# Patient Record
Sex: Female | Born: 1970
Health system: Southern US, Community
[De-identification: ages and names within clinical notes are randomized; demographics above are authoritative.]

## PROBLEM LIST (undated history)

## (undated) DIAGNOSIS — Z8639 Personal history of other endocrine, nutritional and metabolic disease: Secondary | ICD-10-CM

## (undated) DIAGNOSIS — B009 Herpesviral infection, unspecified: Secondary | ICD-10-CM

## (undated) DIAGNOSIS — Z8619 Personal history of other infectious and parasitic diseases: Secondary | ICD-10-CM

## (undated) DIAGNOSIS — B9689 Other specified bacterial agents as the cause of diseases classified elsewhere: Secondary | ICD-10-CM

## (undated) DIAGNOSIS — U071 COVID-19: Secondary | ICD-10-CM

## (undated) DIAGNOSIS — N760 Acute vaginitis: Secondary | ICD-10-CM

## (undated) HISTORY — DX: Personal history of other infectious and parasitic diseases: Z86.19

## (undated) HISTORY — DX: Herpesviral infection, unspecified: B00.9

## (undated) HISTORY — DX: Personal history of other endocrine, nutritional and metabolic disease: Z86.39

## (undated) HISTORY — DX: Acute vaginitis: N76.0

## (undated) HISTORY — DX: Other specified bacterial agents as the cause of diseases classified elsewhere: B96.89

---

## 1993-01-04 DIAGNOSIS — Z8619 Personal history of other infectious and parasitic diseases: Secondary | ICD-10-CM

## 1993-01-04 DIAGNOSIS — B009 Herpesviral infection, unspecified: Secondary | ICD-10-CM

## 1993-01-04 HISTORY — DX: Herpesviral infection, unspecified: B00.9

## 1993-01-04 HISTORY — DX: Personal history of other infectious and parasitic diseases: Z86.19

## 1997-03-23 ENCOUNTER — Inpatient Hospital Stay (HOSPITAL_COMMUNITY): Admission: AD | Admit: 1997-03-23 | Discharge: 1997-03-27 | Payer: Self-pay | Admitting: Obstetrics & Gynecology

## 1997-04-03 ENCOUNTER — Encounter: Admission: RE | Admit: 1997-04-03 | Discharge: 1997-07-02 | Payer: Self-pay | Admitting: Obstetrics & Gynecology

## 1997-07-05 ENCOUNTER — Other Ambulatory Visit: Admission: RE | Admit: 1997-07-05 | Discharge: 1997-07-05 | Payer: Self-pay | Admitting: Obstetrics & Gynecology

## 1998-11-21 ENCOUNTER — Other Ambulatory Visit: Admission: RE | Admit: 1998-11-21 | Discharge: 1998-11-21 | Payer: Self-pay | Admitting: Obstetrics & Gynecology

## 1999-01-05 DIAGNOSIS — B9689 Other specified bacterial agents as the cause of diseases classified elsewhere: Secondary | ICD-10-CM

## 1999-01-05 HISTORY — DX: Other specified bacterial agents as the cause of diseases classified elsewhere: B96.89

## 1999-12-09 ENCOUNTER — Other Ambulatory Visit: Admission: RE | Admit: 1999-12-09 | Discharge: 1999-12-09 | Payer: Self-pay | Admitting: Obstetrics and Gynecology

## 2000-12-14 ENCOUNTER — Other Ambulatory Visit: Admission: RE | Admit: 2000-12-14 | Discharge: 2000-12-14 | Payer: Self-pay | Admitting: Obstetrics and Gynecology

## 2001-12-13 ENCOUNTER — Other Ambulatory Visit: Admission: RE | Admit: 2001-12-13 | Discharge: 2001-12-13 | Payer: Self-pay | Admitting: Obstetrics and Gynecology

## 2002-12-20 ENCOUNTER — Other Ambulatory Visit: Admission: RE | Admit: 2002-12-20 | Discharge: 2002-12-20 | Payer: Self-pay | Admitting: Obstetrics and Gynecology

## 2003-11-25 ENCOUNTER — Ambulatory Visit: Payer: Self-pay

## 2003-12-25 ENCOUNTER — Other Ambulatory Visit: Admission: RE | Admit: 2003-12-25 | Discharge: 2003-12-25 | Payer: Self-pay | Admitting: Obstetrics and Gynecology

## 2004-12-30 ENCOUNTER — Other Ambulatory Visit: Admission: RE | Admit: 2004-12-30 | Discharge: 2004-12-30 | Payer: Self-pay | Admitting: Obstetrics and Gynecology

## 2007-12-22 ENCOUNTER — Emergency Department (HOSPITAL_COMMUNITY): Admission: EM | Admit: 2007-12-22 | Discharge: 2007-12-22 | Payer: Self-pay | Admitting: Emergency Medicine

## 2008-03-18 DIAGNOSIS — Z8639 Personal history of other endocrine, nutritional and metabolic disease: Secondary | ICD-10-CM

## 2008-03-18 HISTORY — DX: Personal history of other endocrine, nutritional and metabolic disease: Z86.39

## 2008-04-15 ENCOUNTER — Ambulatory Visit (HOSPITAL_COMMUNITY): Admission: RE | Admit: 2008-04-15 | Discharge: 2008-04-15 | Payer: Self-pay | Admitting: Obstetrics and Gynecology

## 2009-04-18 ENCOUNTER — Ambulatory Visit (HOSPITAL_COMMUNITY): Admission: RE | Admit: 2009-04-18 | Discharge: 2009-04-18 | Payer: Self-pay | Admitting: Obstetrics and Gynecology

## 2010-04-08 ENCOUNTER — Other Ambulatory Visit (HOSPITAL_COMMUNITY): Payer: Self-pay | Admitting: Obstetrics and Gynecology

## 2010-04-08 DIAGNOSIS — Z1231 Encounter for screening mammogram for malignant neoplasm of breast: Secondary | ICD-10-CM

## 2010-04-22 ENCOUNTER — Ambulatory Visit (HOSPITAL_COMMUNITY)
Admission: RE | Admit: 2010-04-22 | Discharge: 2010-04-22 | Disposition: A | Discharge status: Home / Self Care | Payer: BC Managed Care – PPO | Source: Ambulatory Visit | Attending: Obstetrics and Gynecology | Admitting: Obstetrics and Gynecology

## 2010-04-22 DIAGNOSIS — Z1231 Encounter for screening mammogram for malignant neoplasm of breast: Secondary | ICD-10-CM | POA: Insufficient documentation

## 2011-03-26 ENCOUNTER — Other Ambulatory Visit: Payer: Self-pay | Admitting: Obstetrics and Gynecology

## 2011-03-26 DIAGNOSIS — Z1231 Encounter for screening mammogram for malignant neoplasm of breast: Secondary | ICD-10-CM

## 2011-03-31 DIAGNOSIS — B009 Herpesviral infection, unspecified: Secondary | ICD-10-CM | POA: Insufficient documentation

## 2011-04-06 ENCOUNTER — Encounter: Payer: Self-pay | Admitting: Registered Nurse

## 2011-04-20 ENCOUNTER — Ambulatory Visit: Payer: Self-pay | Admitting: Obstetrics and Gynecology

## 2011-04-22 ENCOUNTER — Ambulatory Visit: Payer: Self-pay | Admitting: Obstetrics and Gynecology

## 2011-04-26 ENCOUNTER — Ambulatory Visit (HOSPITAL_COMMUNITY)
Admission: RE | Admit: 2011-04-26 | Discharge: 2011-04-26 | Disposition: A | Discharge status: Home / Self Care | Payer: BC Managed Care – PPO | Source: Ambulatory Visit | Attending: Obstetrics and Gynecology | Admitting: Obstetrics and Gynecology

## 2011-04-26 DIAGNOSIS — Z1231 Encounter for screening mammogram for malignant neoplasm of breast: Secondary | ICD-10-CM | POA: Insufficient documentation

## 2011-04-29 ENCOUNTER — Encounter: Payer: Self-pay | Admitting: Obstetrics and Gynecology

## 2011-04-29 ENCOUNTER — Ambulatory Visit (INDEPENDENT_AMBULATORY_CARE_PROVIDER_SITE_OTHER): Payer: BC Managed Care – PPO | Admitting: Obstetrics and Gynecology

## 2011-04-29 VITALS — BP 118/78 | HR 78 | Ht 64.5 in | Wt 213.0 lb

## 2011-04-29 DIAGNOSIS — B009 Herpesviral infection, unspecified: Secondary | ICD-10-CM

## 2011-04-29 DIAGNOSIS — Z124 Encounter for screening for malignant neoplasm of cervix: Secondary | ICD-10-CM

## 2011-04-29 DIAGNOSIS — Z9189 Other specified personal risk factors, not elsewhere classified: Secondary | ICD-10-CM

## 2011-04-29 DIAGNOSIS — Z202 Contact with and (suspected) exposure to infections with a predominantly sexual mode of transmission: Secondary | ICD-10-CM

## 2011-04-29 DIAGNOSIS — Z01419 Encounter for gynecological examination (general) (routine) without abnormal findings: Secondary | ICD-10-CM

## 2011-04-29 LAB — RPR

## 2011-04-29 MED ORDER — VALACYCLOVIR HCL 500 MG PO TABS: 500.0000 mg | ORAL_TABLET | Freq: Two times a day (BID) | ORAL | Status: AC

## 2011-04-29 MED ORDER — ETONOGESTREL-ETHINYL ESTRADIOL 0.12-0.015 MG/24HR VA RING: VAGINAL_RING | VAGINAL | Status: DC

## 2011-04-29 NOTE — Progress Notes (Signed)
Last Pap: 03/18/2008 WNL: Yes Regular Periods:yes  Monthly Breast exam:yes Tetanus<52yrs:yes Nl.Bladder Function:yes Daily BMs:yes Healthy Diet:no Calcium:yes Mammogram:yes 04/26/11 Exercise:yes twice/week Seatbelt: yes Abuse at home: no Stressful work:yes Sigmoid-colonoscopy:   Subjective:    Erica [name] is a 41 y.o. female, No obstetric history on file., who presents for an annual exam.     History   Social History  . Marital Status: Married    Spouse Name: N/A    Number of Children: N/A  . Years of Education: N/A   Social History Main Topics  . Smoking status: Never Smoker   . Smokeless tobacco: None  . Alcohol Use: No  . Drug Use: No  . Sexually Active: Yes    Birth Control/ Protection: Other-see comments     Nuvaring    Other Topics Concern  . None   Social History Narrative  . None    Menstrual cycle:   LMP: Patient's last menstrual period was 04/07/2011.           Cycle: flow is light, usually lasting 3 to 4 days and Regular, monthly with normal flow and no severe dysmenorrha  The following portions of the patient's history were reviewed and updated as appropriate: allergies, current medications, past family history, past medical history, past social history, past surgical history and problem list. Wants to continue Nuvaring and Valtrex Review of Systems Pertinent items are noted in HPI. Breast:Negative for breast lump,nipple discharge or nipple retraction Gastrointestinal: Negative for abdominal pain, change in bowel habits or rectal bleeding Urinary:negative   Objective:    BP 118/78  Pulse 78  Ht 5' 4.5" (1.638 m)  Wt 213 lb (96.616 kg)  BMI 36.00 kg/m2  LMP 04/07/2011    Weight:  Wt Readings from Last 1 Encounters:  04/29/11 213 lb (96.616 kg)          BMI: Body mass index is 36.00 kg/(m^2).  General Appearance: Alert, appropriate appearance for age. No acute distress HEENT: Grossly normal Neck / Thyroid: Supple, no masses, nodes or  enlargement Lungs: clear to auscultation bilaterally Back: No CVA tenderness Breast Exam: No masses or nodes.No dimpling, nipple retraction or discharge. Cardiovascular: Regular rate and rhythm. S1, S2, no murmur Gastrointestinal: Soft, non-tender, no masses or organomegaly Pelvic Exam: Vulva and vagina appear normal. Bimanual exam reveals normal uterus and adnexa. Rectovaginal: no masses Lymphatic Exam: Non-palpable nodes in neck, clavicular, axillary, or inguinal regions Skin: no rash or abnormalities Neurologic: Normal gait and speech, no tremor  Psychiatric: Alert and oriented, appropriate affect.   Wet Prep:not applicable Urinalysis:not applicable UPT: Not done   Assessment:    Normal gyn exam  HSV  II with rare outbreaks on Valtrex suppression   Plan:    Mammogram, due 4/12 pap smear with HPV return annually or prn STD screening: done Contraception:NuvaRing vaginal inserts Continue Valtrex      Teya Otterson PMD

## 2011-04-29 NOTE — Patient Instructions (Signed)
HPV Test The HPV (Human papillomavirus) test isused to screen for high-risk types with HPV infection. HPV is a group of about 100 related viruses, of which 40 types are genital viruses. Most HPV viruses cause infections that usually resolve without treatment within 2 years. Some HPV infections can cause skin and genital warts (condylomata). HPV types 16, 18, 31 and 45 are considered high-risk types of HPV. High-risk types of HPV do not usually cause visible warts, but if untreated, may lead to cancers of the outlet of the womb (cervix) or anus. An HPV test identifies the DNA (genetic) strands of the HPV infection. Because the test identifies the DNA strands, the test is also referred to as the HPV DNA test. Although HPV is found in both males and females, the HPV test is only used to screen for cervical cancer in females. This test is recommended for females:  With an abnormal Pap test.   After treatment of an abnormal Pap test.   Aged 30 and older.   After treatment of a high-risk HPV infection.  The HPV test may be done at the same time as a Pap test in females over the age of 30. Both the HPV and Pap test require a sample of cells from the cervix. PREPARATION FOR TEST  You may be asked to avoid douching, tampons or birth canal (vaginal) medicines for 48 hours before the HPV test. You will be asked to urinate before the test. For the HPV test, you will need to lie on an exam table with your feet in stirrups. A spatula will be inserted into the vagina. The spatula will be used to swab the cervix for a cell and mucus sample. The sample will be further evaluated in a lab under a microscope. NORMAL FINDINGS  Normal: High-risk HPV is not found.  Ranges for normal findings may vary among different laboratories and hospitals. You should always check with your doctor after having lab work or other tests done to discuss the meaning of your test results and whether your values are considered within normal  limits. MEANING OF TEST An abnormal HPV test means that high-risk HPV is found. Your caregiver may recommend further testing. Your caregiver will go over the test results with you and discuss the importance and meaning of your results, as well as treatment options and the need for additional tests, if necessary. OBTAINING THE RESULTS  It is your responsibility to obtain your test results. Ask the lab or department performing the test when and how you will get your results. Document Released: 01/16/2004 Document Revised: 12/10/2010 Document Reviewed: 09/30/2004 [employer] Patient Information 2012 [employer]. 

## 2011-05-03 ENCOUNTER — Ambulatory Visit: Payer: Self-pay | Admitting: Obstetrics and Gynecology

## 2011-05-04 LAB — PAP IG, CT-NG NAA, HPV HIGH-RISK
Chlamydia Probe Amp: NEGATIVE
GC Probe Amp: NEGATIVE
HPV DNA High Risk: NOT DETECTED

## 2011-05-06 ENCOUNTER — Ambulatory Visit: Payer: Self-pay | Admitting: Obstetrics and Gynecology

## 2011-05-10 ENCOUNTER — Telehealth: Payer: Self-pay | Admitting: Obstetrics and Gynecology

## 2011-05-12 NOTE — Telephone Encounter (Signed)
Tc to pt per test results. Told pt HIV,RPR,GC/CT-all neg. Pap smear-Wnl and HPV= not detected. Pt voices understanding.

## 2011-07-27 ENCOUNTER — Encounter: Payer: Self-pay | Admitting: Obstetrics and Gynecology

## 2011-09-20 ENCOUNTER — Other Ambulatory Visit: Payer: Self-pay

## 2011-09-20 DIAGNOSIS — Z01419 Encounter for gynecological examination (general) (routine) without abnormal findings: Secondary | ICD-10-CM

## 2011-09-20 NOTE — Telephone Encounter (Signed)
nu[facility]  ld

## 2012-03-28 ENCOUNTER — Other Ambulatory Visit: Payer: Self-pay | Admitting: Obstetrics and Gynecology

## 2012-03-28 DIAGNOSIS — Z1231 Encounter for screening mammogram for malignant neoplasm of breast: Secondary | ICD-10-CM

## 2012-04-26 ENCOUNTER — Ambulatory Visit (HOSPITAL_COMMUNITY)
Admission: RE | Admit: 2012-04-26 | Discharge: 2012-04-26 | Disposition: A | Discharge status: Home / Self Care | Payer: BC Managed Care – PPO | Source: Ambulatory Visit | Attending: Obstetrics and Gynecology | Admitting: Obstetrics and Gynecology

## 2012-04-26 DIAGNOSIS — Z1231 Encounter for screening mammogram for malignant neoplasm of breast: Secondary | ICD-10-CM | POA: Insufficient documentation

## 2013-02-13 ENCOUNTER — Other Ambulatory Visit: Payer: Self-pay | Admitting: Obstetrics and Gynecology

## 2013-02-13 DIAGNOSIS — Z1231 Encounter for screening mammogram for malignant neoplasm of breast: Secondary | ICD-10-CM

## 2013-04-27 ENCOUNTER — Ambulatory Visit (HOSPITAL_COMMUNITY)
Admission: RE | Admit: 2013-04-27 | Discharge: 2013-04-27 | Disposition: A | Discharge status: Home / Self Care | Payer: 59 | Source: Ambulatory Visit | Attending: Obstetrics and Gynecology | Admitting: Obstetrics and Gynecology

## 2013-04-27 DIAGNOSIS — Z1231 Encounter for screening mammogram for malignant neoplasm of breast: Secondary | ICD-10-CM | POA: Insufficient documentation

## 2014-02-28 ENCOUNTER — Other Ambulatory Visit (HOSPITAL_COMMUNITY): Payer: Self-pay | Admitting: Obstetrics and Gynecology

## 2014-02-28 DIAGNOSIS — Z1231 Encounter for screening mammogram for malignant neoplasm of breast: Secondary | ICD-10-CM

## 2014-04-26 ENCOUNTER — Ambulatory Visit (HOSPITAL_COMMUNITY): Payer: Self-pay

## 2014-04-30 ENCOUNTER — Ambulatory Visit (HOSPITAL_COMMUNITY): Payer: Self-pay

## 2014-05-02 ENCOUNTER — Ambulatory Visit (HOSPITAL_COMMUNITY)
Admission: RE | Admit: 2014-05-02 | Discharge: 2014-05-02 | Disposition: A | Discharge status: Home / Self Care | Payer: 59 | Source: Ambulatory Visit | Attending: Obstetrics and Gynecology | Admitting: Obstetrics and Gynecology

## 2014-05-02 ENCOUNTER — Ambulatory Visit (HOSPITAL_COMMUNITY): Payer: Self-pay

## 2014-05-02 DIAGNOSIS — Z1231 Encounter for screening mammogram for malignant neoplasm of breast: Secondary | ICD-10-CM | POA: Insufficient documentation

## 2015-07-14 DIAGNOSIS — Z23 Encounter for immunization: Secondary | ICD-10-CM | POA: Diagnosis not present

## 2015-07-14 DIAGNOSIS — E7800 Pure hypercholesterolemia, unspecified: Secondary | ICD-10-CM | POA: Diagnosis not present

## 2015-07-14 DIAGNOSIS — Z0000 Encounter for general adult medical examination without abnormal findings: Secondary | ICD-10-CM | POA: Diagnosis not present

## 2017-01-15 ENCOUNTER — Other Ambulatory Visit: Payer: Self-pay

## 2017-01-15 ENCOUNTER — Encounter (HOSPITAL_COMMUNITY): Payer: Self-pay | Admitting: *Deleted

## 2017-01-15 ENCOUNTER — Ambulatory Visit (HOSPITAL_COMMUNITY)
Admission: EM | Admit: 2017-01-15 | Discharge: 2017-01-15 | Disposition: A | Discharge status: Home / Self Care | Payer: BLUE CROSS/BLUE SHIELD | Attending: Family Medicine | Admitting: Family Medicine

## 2017-01-15 DIAGNOSIS — J40 Bronchitis, not specified as acute or chronic: Secondary | ICD-10-CM | POA: Diagnosis not present

## 2017-01-15 DIAGNOSIS — B356 Tinea cruris: Secondary | ICD-10-CM

## 2017-01-15 MED ORDER — DOXYCYCLINE HYCLATE 100 MG PO TABS: 100.0000 mg | ORAL_TABLET | Freq: Two times a day (BID) | ORAL | 0 refills | Status: DC

## 2017-01-15 MED ORDER — PREDNISONE 20 MG PO TABS: ORAL_TABLET | ORAL | 0 refills | Status: DC

## 2017-01-15 MED ORDER — KETOCONAZOLE 2 % EX CREA: 1.0000 "application " | TOPICAL_CREAM | Freq: Two times a day (BID) | CUTANEOUS | 0 refills | Status: DC

## 2017-01-15 NOTE — ED Triage Notes (Signed)
Started with scratchy throat 5 days ago.  Started with productive cough 4 days ago.  Continues with cough & coughing fits.  Denies fevers.  Also c/o "irritated" red spot under left axilla x 2 wks.

## 2017-01-15 NOTE — Discharge Instructions (Signed)
Expect improvement in several days or return for further evaluation

## 2017-01-15 NOTE — ED Provider Notes (Signed)
[place]   [phone] 01/15/17 Arrival Time: 8416   SUBJECTIVE:  Erica [name] is a 47 y.o. female who presents to the urgent care with complaint of scratchy throat 5 days ago.  Also has productive cough 4 days ago.  Continues with cough & coughing fits.  Denies fevers.  Also c/o "irritated" red spot under left axilla x 2 wks.  Works for [employer]  Past Medical History:  Diagnosis Date  . BV (bacterial vaginosis) 2001  . H/O gonorrhea 1995  . History of elevated lipids 03/18/2008  . HSV-2 infection 1995   Family History  Problem Relation Age of Onset  . Hypertension Maternal Grandmother   . Hypertension Maternal Grandfather   . Asthma Cousin    Social History   Socioeconomic History  . Marital status: Single    Spouse name: Not on file  . Number of children: Not on file  . Years of education: Not on file  . Highest education level: Not on file  Social Needs  . Financial resource strain: Not on file  . Food insecurity - worry: Not on file  . Food insecurity - inability: Not on file  . Transportation needs - medical: Not on file  . Transportation needs - non-medical: Not on file  Occupational History  . Not on file  Tobacco Use  . Smoking status: Never Smoker  Substance and Sexual Activity  . Alcohol use: No  . Drug use: No  . Sexual activity: Not on file    Comment: Nuvaring   Other Topics Concern  . Not on file  Social History Narrative  . Not on file   Current Meds  Medication Sig  . Multiple Vitamin (MULITIVITAMIN WITH MINERALS) TABS Take 1 tablet by mouth daily.   No Known Allergies    ROS: As per HPI, remainder of ROS negative.   OBJECTIVE:   Vitals:   01/15/17 1626  BP: (!) 141/75  Pulse: 63  Resp: 20  Temp: 99 F (37.2 C)  TempSrc: Oral  SpO2: 100%     General appearance: alert; no distress Eyes: PERRL; EOMI; conjunctiva normal HENT: normocephalic; atraumatic; TMs normal, canal normal, external ears normal without  trauma; nasal mucosa normal; oral mucosa normal Neck: supple Lungs: clear to auscultation bilaterally Heart: regular rate and rhythm Abdomen: soft, non-tender; bowel sounds normal; no masses or organomegaly; no guarding or rebound tenderness Back: no CVA tenderness Extremities: no cyanosis or edema; symmetrical with no gross deformities Skin: warm and dry; erythematous streaky macular rash left axilla; nontender and no induration Neurologic: normal gait; grossly normal Psychological: alert and cooperative; normal mood and affect      Labs:  Results for orders placed or performed in visit on 04/29/11  HIV antibody  Result Value Ref Range   HIV NON REACTIVE NON REACTIVE  RPR  Result Value Ref Range   RPR Ser Ql NON REAC NON REAC  Pap IG, CT/NG NAA, and HPV (high risk)  Result Value Ref Range   HPV DNA High Risk NOT DETECTED    Specimen adequacy:     FINAL DIAGNOSIS:     COMMENTS:     Cytotechnologist:     QC Cytotechnologist:     Chlamydia Probe Amp NEGATIVE    GC Probe Amp NEGATIVE     Labs Reviewed - No data to display  No results found.     ASSESSMENT & PLAN:  1. Bronchitis   2. Tinea cruris     Meds ordered  this encounter  Medications  . ketoconazole (NIZORAL) 2 % cream    Sig: Apply 1 application topically 2 (two) times daily.    Dispense:  30 g    Refill:  0  . predniSONE (DELTASONE) 20 MG tablet    Sig: Two daily with food    Dispense:  6 tablet    Refill:  0  . doxycycline (VIBRA-TABS) 100 MG tablet    Sig: Take 1 tablet (100 mg total) by mouth 2 (two) times daily.    Dispense:  20 tablet    Refill:  0    Reviewed expectations re: course of current medical issues. Questions answered. Outlined signs and symptoms indicating need for more acute intervention. Patient verbalized understanding. After Visit Summary given.    Procedures:      Robyn Haber, MD 01/15/17 1639

## 2017-04-27 DIAGNOSIS — B009 Herpesviral infection, unspecified: Secondary | ICD-10-CM | POA: Diagnosis not present

## 2017-04-27 DIAGNOSIS — Z124 Encounter for screening for malignant neoplasm of cervix: Secondary | ICD-10-CM | POA: Diagnosis not present

## 2017-04-27 DIAGNOSIS — N912 Amenorrhea, unspecified: Secondary | ICD-10-CM | POA: Diagnosis not present

## 2017-04-27 DIAGNOSIS — Z1231 Encounter for screening mammogram for malignant neoplasm of breast: Secondary | ICD-10-CM | POA: Diagnosis not present

## 2017-04-27 DIAGNOSIS — Z3040 Encounter for surveillance of contraceptives, unspecified: Secondary | ICD-10-CM | POA: Diagnosis not present

## 2017-04-27 DIAGNOSIS — Z01419 Encounter for gynecological examination (general) (routine) without abnormal findings: Secondary | ICD-10-CM | POA: Diagnosis not present

## 2017-04-27 DIAGNOSIS — Z113 Encounter for screening for infections with a predominantly sexual mode of transmission: Secondary | ICD-10-CM | POA: Diagnosis not present

## 2017-05-25 DIAGNOSIS — J069 Acute upper respiratory infection, unspecified: Secondary | ICD-10-CM | POA: Diagnosis not present

## 2017-07-01 DIAGNOSIS — Z0000 Encounter for general adult medical examination without abnormal findings: Secondary | ICD-10-CM | POA: Diagnosis not present

## 2017-07-01 DIAGNOSIS — E7800 Pure hypercholesterolemia, unspecified: Secondary | ICD-10-CM | POA: Diagnosis not present

## 2017-10-26 DIAGNOSIS — M79659 Pain in unspecified thigh: Secondary | ICD-10-CM | POA: Diagnosis not present

## 2017-11-16 DIAGNOSIS — I781 Nevus, non-neoplastic: Secondary | ICD-10-CM | POA: Diagnosis not present

## 2017-11-16 DIAGNOSIS — I83813 Varicose veins of bilateral lower extremities with pain: Secondary | ICD-10-CM | POA: Diagnosis not present

## 2017-12-06 DIAGNOSIS — M545 Low back pain: Secondary | ICD-10-CM | POA: Diagnosis not present

## 2018-01-06 ENCOUNTER — Encounter (HOSPITAL_COMMUNITY): Payer: BLUE CROSS/BLUE SHIELD

## 2018-01-06 ENCOUNTER — Encounter: Payer: BLUE CROSS/BLUE SHIELD | Admitting: Vascular Surgery

## 2018-01-10 DIAGNOSIS — M545 Low back pain: Secondary | ICD-10-CM | POA: Diagnosis not present

## 2018-01-17 DIAGNOSIS — M545 Low back pain: Secondary | ICD-10-CM | POA: Diagnosis not present

## 2018-01-24 DIAGNOSIS — M545 Low back pain: Secondary | ICD-10-CM | POA: Diagnosis not present

## 2018-02-09 DIAGNOSIS — L03113 Cellulitis of right upper limb: Secondary | ICD-10-CM | POA: Diagnosis not present

## 2018-03-13 DIAGNOSIS — B353 Tinea pedis: Secondary | ICD-10-CM | POA: Diagnosis not present

## 2018-03-13 DIAGNOSIS — L309 Dermatitis, unspecified: Secondary | ICD-10-CM | POA: Diagnosis not present

## 2018-04-06 DIAGNOSIS — M25511 Pain in right shoulder: Secondary | ICD-10-CM | POA: Diagnosis not present

## 2018-06-07 DIAGNOSIS — Z01419 Encounter for gynecological examination (general) (routine) without abnormal findings: Secondary | ICD-10-CM | POA: Diagnosis not present

## 2018-06-07 DIAGNOSIS — Z124 Encounter for screening for malignant neoplasm of cervix: Secondary | ICD-10-CM | POA: Diagnosis not present

## 2018-06-07 DIAGNOSIS — Z1231 Encounter for screening mammogram for malignant neoplasm of breast: Secondary | ICD-10-CM | POA: Diagnosis not present

## 2018-06-07 DIAGNOSIS — Z113 Encounter for screening for infections with a predominantly sexual mode of transmission: Secondary | ICD-10-CM | POA: Diagnosis not present

## 2018-06-22 DIAGNOSIS — R635 Abnormal weight gain: Secondary | ICD-10-CM | POA: Diagnosis not present

## 2018-06-22 DIAGNOSIS — R140 Abdominal distension (gaseous): Secondary | ICD-10-CM | POA: Diagnosis not present

## 2018-06-22 DIAGNOSIS — Z1211 Encounter for screening for malignant neoplasm of colon: Secondary | ICD-10-CM | POA: Diagnosis not present

## 2018-06-30 DIAGNOSIS — Z3040 Encounter for surveillance of contraceptives, unspecified: Secondary | ICD-10-CM | POA: Diagnosis not present

## 2018-07-04 DIAGNOSIS — E7800 Pure hypercholesterolemia, unspecified: Secondary | ICD-10-CM | POA: Diagnosis not present

## 2018-07-04 DIAGNOSIS — Z0000 Encounter for general adult medical examination without abnormal findings: Secondary | ICD-10-CM | POA: Diagnosis not present

## 2018-07-14 DIAGNOSIS — M67911 Unspecified disorder of synovium and tendon, right shoulder: Secondary | ICD-10-CM | POA: Diagnosis not present

## 2018-07-14 DIAGNOSIS — M25511 Pain in right shoulder: Secondary | ICD-10-CM | POA: Diagnosis not present

## 2018-07-14 DIAGNOSIS — M545 Low back pain: Secondary | ICD-10-CM | POA: Diagnosis not present

## 2018-08-04 DIAGNOSIS — Z1211 Encounter for screening for malignant neoplasm of colon: Secondary | ICD-10-CM | POA: Diagnosis not present

## 2018-08-04 DIAGNOSIS — K635 Polyp of colon: Secondary | ICD-10-CM | POA: Diagnosis not present

## 2018-08-04 DIAGNOSIS — D124 Benign neoplasm of descending colon: Secondary | ICD-10-CM | POA: Diagnosis not present

## 2018-08-04 DIAGNOSIS — D122 Benign neoplasm of ascending colon: Secondary | ICD-10-CM | POA: Diagnosis not present

## 2018-08-12 ENCOUNTER — Ambulatory Visit (HOSPITAL_COMMUNITY)
Admission: EM | Admit: 2018-08-12 | Discharge: 2018-08-12 | Disposition: A | Discharge status: Home / Self Care | Payer: BC Managed Care – PPO | Attending: Family Medicine | Admitting: Family Medicine

## 2018-08-12 ENCOUNTER — Other Ambulatory Visit: Payer: Self-pay

## 2018-08-12 ENCOUNTER — Encounter (HOSPITAL_COMMUNITY): Payer: Self-pay | Admitting: *Deleted

## 2018-08-12 DIAGNOSIS — S161XXA Strain of muscle, fascia and tendon at neck level, initial encounter: Secondary | ICD-10-CM | POA: Diagnosis not present

## 2018-08-12 MED ORDER — METHYLPREDNISOLONE 4 MG PO TABS
4.0000 mg | ORAL_TABLET | Freq: Every day | ORAL | 1 refills | Status: DC
Start: 2018-08-12 — End: 2019-04-28
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
## 2018-09-12 DIAGNOSIS — L638 Other alopecia areata: Secondary | ICD-10-CM | POA: Diagnosis not present

## 2018-10-31 DIAGNOSIS — L638 Other alopecia areata: Secondary | ICD-10-CM | POA: Diagnosis not present

## 2018-10-31 DIAGNOSIS — L659 Nonscarring hair loss, unspecified: Secondary | ICD-10-CM | POA: Diagnosis not present

## 2018-12-12 DIAGNOSIS — L638 Other alopecia areata: Secondary | ICD-10-CM | POA: Diagnosis not present

## 2019-01-09 DIAGNOSIS — M79643 Pain in unspecified hand: Secondary | ICD-10-CM | POA: Diagnosis not present

## 2019-01-09 DIAGNOSIS — M792 Neuralgia and neuritis, unspecified: Secondary | ICD-10-CM | POA: Diagnosis not present

## 2019-01-23 DIAGNOSIS — L638 Other alopecia areata: Secondary | ICD-10-CM | POA: Diagnosis not present

## 2019-01-24 ENCOUNTER — Other Ambulatory Visit: Payer: Self-pay

## 2019-01-24 ENCOUNTER — Emergency Department (HOSPITAL_BASED_OUTPATIENT_CLINIC_OR_DEPARTMENT_OTHER): Payer: Worker's Compensation | Attending: Emergency Medicine

## 2019-01-24 ENCOUNTER — Encounter (HOSPITAL_BASED_OUTPATIENT_CLINIC_OR_DEPARTMENT_OTHER): Payer: Self-pay | Admitting: *Deleted

## 2019-01-24 ENCOUNTER — Emergency Department (HOSPITAL_BASED_OUTPATIENT_CLINIC_OR_DEPARTMENT_OTHER): Payer: Worker's Compensation

## 2019-01-24 ENCOUNTER — Emergency Department (HOSPITAL_BASED_OUTPATIENT_CLINIC_OR_DEPARTMENT_OTHER)
Admission: EM | Admit: 2019-01-24 | Discharge: 2019-01-25 | Disposition: A | Discharge status: Home / Self Care | Payer: No Typology Code available for payment source | Attending: Emergency Medicine | Admitting: Emergency Medicine

## 2019-01-24 DIAGNOSIS — W228XXA Striking against or struck by other objects, initial encounter: Secondary | ICD-10-CM | POA: Diagnosis not present

## 2019-01-24 DIAGNOSIS — Y990 Civilian activity done for income or pay: Secondary | ICD-10-CM | POA: Diagnosis not present

## 2019-01-24 DIAGNOSIS — S8992XA Unspecified injury of left lower leg, initial encounter: Secondary | ICD-10-CM | POA: Diagnosis present

## 2019-01-24 DIAGNOSIS — Y9389 Activity, other specified: Secondary | ICD-10-CM | POA: Insufficient documentation

## 2019-01-24 DIAGNOSIS — Y9289 Other specified places as the place of occurrence of the external cause: Secondary | ICD-10-CM | POA: Diagnosis not present

## 2019-01-24 DIAGNOSIS — S8002XA Contusion of left knee, initial encounter: Secondary | ICD-10-CM | POA: Insufficient documentation

## 2019-01-24 DIAGNOSIS — Z79899 Other long term (current) drug therapy: Secondary | ICD-10-CM | POA: Insufficient documentation

## 2019-01-24 DIAGNOSIS — S8000XA Contusion of unspecified knee, initial encounter: Secondary | ICD-10-CM

## 2019-01-24 LAB — BASIC METABOLIC PANEL
Anion gap: 11 (ref 5–15)
BUN: 17 mg/dL (ref 6–20)
CO2: 22 mmol/L (ref 22–32)
Calcium: 9.2 mg/dL (ref 8.9–10.3)
Chloride: 105 mmol/L (ref 98–111)
Creatinine, Ser: 1.04 mg/dL — ABNORMAL HIGH (ref 0.44–1.00)
GFR calc Af Amer: >60 mL/min (ref >60)
GFR calc non Af Amer: >60 mL/min (ref >60)
Glucose, Bld: 103 mg/dL — ABNORMAL HIGH (ref 70–99)
Potassium: 3.6 mmol/L (ref 3.5–5.1)
Sodium: 138 mmol/L (ref 135–145)

## 2019-01-24 MED ORDER — IOHEXOL 350 MG/ML SOLN
100.0000 mL | Freq: Once | INTRAVENOUS | Status: AC
  Administered 2019-01-24: 100 mL via INTRAVENOUS

## 2019-01-24 NOTE — Discharge Instructions (Addendum)
There is no evidence of fracture.  Follow-up with your primary doctor.  Keep your leg elevated and use anti-inflammatories.  Return to the ED with worsening pain, numbness, tingling, or other concerns.

## 2019-01-24 NOTE — ED Notes (Signed)
Pt returned from CT °

## 2019-01-24 NOTE — ED Provider Notes (Signed)
[facility] EMERGENCY DEPARTMENT Provider Note   CSN: [id number] Arrival date & time: 01/24/19  1905     History Chief Complaint  Patient presents with  . Knee Injury    Erica [name] is a 49 y.o. female.  Patient here with bilateral knee injury.  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  This pallet pinched her against her forklift causing her knees and lower legs to become pinned.  This lasted for several seconds.  She was able to lower herself to the ground did not fall or hit her head.  She complains of bilateral proximal lower leg pain left greater than right.  She states she felt a "rush" of liquid in her left leg after this happened.  She denies any numbness or tingling.  She denies any weakness in flexion extension of her knees or ankles.  She is able to ambulate.  She denies any head, neck or back pain. No open wounds.  The history is provided by the patient.       Past Medical History:  Diagnosis Date  . BV (bacterial vaginosis) 2001  . H/O gonorrhea 1995  . History of elevated lipids 03/18/2008  . HSV-2 infection 1995    Patient Active Problem List   Diagnosis Date Noted  . Possible exposure to STD 04/29/2011  . HSV-2 infection 03/31/2011    History reviewed. No pertinent surgical history.   OB History   No obstetric history on file.     Family History  Problem Relation Age of Onset  . Hypertension Maternal Grandmother   . Hypertension Maternal Grandfather   . Asthma Cousin     Social History   Tobacco Use  . Smoking status: Never Smoker  . Smokeless tobacco: Never Used  Substance Use Topics  . Alcohol use: Yes    Comment: socially  . Drug use: No    Home Medications Prior to Admission medications   Medication Sig Start Date End Date Taking? Authorizing Provider  etonogestrel-ethinyl estradiol (NUVARING) 0.12-0.015 MG/24HR vaginal  ring 1 ring every 3 weeks with one wk without ring 04/29/11   Haygood, Seymour Bars, MD  methylPREDNISolone (MEDROL) 4 MG tablet Take 1 tablet (4 mg total) by mouth daily. 08/12/18   Robyn Haber, MD  Multiple Vitamin (MULITIVITAMIN WITH MINERALS) TABS Take 1 tablet by mouth daily.    [provider]  valACYclovir (VALTREX) 500 MG tablet Take 1 tablet (500 mg total) by mouth 2 (two) times daily. 04/29/11   Haygood, Seymour Bars, MD    Allergies    Patient has no known allergies.  Review of Systems   Review of Systems  Constitutional: Negative for activity change, appetite change and fever.  HENT: Negative for congestion and rhinorrhea.   Respiratory: Negative for cough, chest tightness and shortness of breath.   Gastrointestinal: Negative for abdominal pain, nausea and vomiting.  Genitourinary: Negative for dysuria and hematuria.  Musculoskeletal: Positive for arthralgias and myalgias.  Skin: Negative for rash.  Neurological: Negative for headaches.   all other systems are negative except as noted in the HPI and PMH.    Physical Exam Updated Vital Signs BP (!) 167/97   Pulse 91   Temp 98.5 F (36.9 C) (Oral)   Resp 18   Ht 5\' 5"  (1.651 m)   Wt 106.1 kg   LMP 01/17/2019   SpO2 99%  BMI 38.94 kg/m   Physical Exam Vitals and nursing note reviewed.  Constitutional:      General: She is not in acute distress.    Appearance: She is well-developed. She is obese.  HENT:     Head: Normocephalic and atraumatic.     Mouth/Throat:     Pharynx: No oropharyngeal exudate.  Eyes:     Conjunctiva/sclera: Conjunctivae normal.     Pupils: Pupils are equal, round, and reactive to light.  Neck:     Comments: No meningismus. Cardiovascular:     Rate and Rhythm: Normal rate and regular rhythm.     Heart sounds: Normal heart sounds. No murmur.  Pulmonary:     Effort: Pulmonary effort is normal. No respiratory distress.     Breath sounds: Normal breath sounds.  Abdominal:      Palpations: Abdomen is soft.     Tenderness: There is no abdominal tenderness. There is no guarding or rebound.  Musculoskeletal:        General: Swelling, tenderness and signs of injury present. Normal range of motion.     Cervical back: Normal range of motion and neck supple.     Left lower leg: Edema present.     Comments: Ecchymosis to left proximal tibia that is tender.  She is able to flex and extend her knee without difficulty.  No ligament laxity. Intact DP and PT pulses bilaterally.  Mild tenderness to right proximal fibula.  She is able to flex and extend her knee without difficulty.   Skin:    General: Skin is warm.  Neurological:     Mental Status: She is alert and oriented to person, place, and time.     Cranial Nerves: No cranial nerve deficit.     Motor: No abnormal muscle tone.     Coordination: Coordination normal.     Comments:  5/5 strength throughout. CN 2-12 intact.Equal grip strength.   Psychiatric:        Behavior: Behavior normal.     ED Results / Procedures / Treatments   Labs (all labs ordered are listed, but only abnormal results are displayed) Labs Reviewed  BASIC METABOLIC PANEL - Abnormal; Notable for the following components:      Result Value   Glucose, Bld 103 (*)    Creatinine, Ser 1.04 (*)    All other components within normal limits    EKG None  Radiology CT ANGIO AO+BIFEM W & OR WO CONTRAST  Result Date: 01/24/2019 CLINICAL DATA:  Leg pain. The patient was pinned between a forklift and freight. EXAM: CT ANGIOGRAPHY OF ABDOMINAL AORTA WITH ILIOFEMORAL RUNOFF TECHNIQUE: Multidetector CT imaging of the abdomen, pelvis and lower extremities was performed using the standard protocol during bolus administration of intravenous contrast. Multiplanar CT image reconstructions and MIPs were obtained to evaluate the vascular anatomy. CONTRAST:  186mL OMNIPAQUE IOHEXOL 350 MG/ML SOLN COMPARISON:  None. FINDINGS: VASCULAR Aorta: Normal caliber aorta  without aneurysm, dissection, vasculitis or significant stenosis. Celiac: Patent without evidence of aneurysm, dissection, vasculitis or significant stenosis. SMA: Patent without evidence of aneurysm, dissection, vasculitis or significant stenosis. Renals: Both renal arteries are patent without evidence of aneurysm, dissection, vasculitis, fibromuscular dysplasia or significant stenosis. IMA: Patent without evidence of aneurysm, dissection, vasculitis or significant stenosis. RIGHT Lower Extremity Inflow: Common, internal and external iliac arteries are patent without evidence of aneurysm, dissection, vasculitis or significant stenosis. Outflow: Common, superficial and profunda femoral arteries and the popliteal artery are patent without evidence of aneurysm, dissection,  vasculitis or significant stenosis. Runoff: There is an at least 2 vessel runoff to the level of the ankle via the anterior tibial and peroneal arteries. The posterior tibial artery appears to be occluded proximally. There are few varicose veins noted. LEFT Lower Extremity Inflow: Common, internal and external iliac arteries are patent without evidence of aneurysm, dissection, vasculitis or significant stenosis. Outflow: Common, superficial and profunda femoral arteries and the popliteal artery are patent without evidence of aneurysm, dissection, vasculitis or significant stenosis. Runoff: Patent three vessel runoff to the ankle. Veins: Bilateral varicose veins are noted. Review of the MIP images confirms the above findings. NON-VASCULAR Lower chest: The lung bases are clear.The heart was not visualized on this exam. Hepatobiliary: The liver is normal. Normal gallbladder.There is no biliary ductal dilation. Pancreas: Normal contours without ductal dilatation. No peripancreatic fluid collection. Spleen: No splenic laceration or hematoma. Adrenals/Urinary Tract: --Adrenal glands: No adrenal hemorrhage. --Right kidney/ureter: No hydronephrosis or  perinephric hematoma. --Left kidney/ureter: No hydronephrosis or perinephric hematoma. --Urinary bladder: The bladder is decompressed which limits evaluation. Stomach/Bowel: --Stomach/Duodenum: No hiatal hernia or other gastric abnormality. Normal duodenal course and caliber. --Small bowel: No dilatation or inflammation. --Colon: There is a focal area of narrowing at the level of the hepatic flexure favored to represent peristalsis. --Appendix: Normal. Vascular/Lymphatic: Normal course and caliber of the major abdominal vessels. --No retroperitoneal lymphadenopathy. --No mesenteric lymphadenopathy. --No pelvic or inguinal lymphadenopathy. Reproductive: A NuvaRing is noted. There is a small fibroid involving the anterior uterine fundus. Other: No ascites or free air. There are bilateral fat containing inguinal hernias. Musculoskeletal. There is no acute displaced fracture or dislocation. There is soft tissue swelling about the left knee. There is no significant suprapatellar joint effusion involving either knee. IMPRESSION: VASCULAR 1. No acute vascular injury. 2. There is an at least 2 vessel runoff to the right ankle with occlusion of the proximal right posterior tibial artery. 3. There is a 3 vessel runoff on the left. 4. Bilateral varicose veins are incidentally noted. NON-VASCULAR 1. No acute displaced fracture.  No dislocation. 2. There is soft tissue swelling about the left knee. No joint effusion bilaterally. 3. Fibroid uterus. Electronically Signed   By: Constance Holster M.D.   On: 01/24/2019 23:09   DG Knee Complete 4 Views Left  Result Date: 01/24/2019 CLINICAL DATA:  BILATERAL knee injury, pinned between fork lift and freight for 1 hour, LEFT greater than RIGHT knee pain, initial encounter EXAM: LEFT KNEE - COMPLETE 4+ VIEW COMPARISON:  None FINDINGS: Osseous mineralization normal. Osteoarthritic changes at patellofemoral joint and medial compartment with joint space narrowing and spur formation.  Mild anterior soft tissue swelling at the proximal lower leg. No acute fracture, dislocation, or bone destruction. IMPRESSION: Osteoarthritic changes LEFT knee. No acute osseous abnormalities. Electronically Signed   By: Lavonia Dana M.D.   On: 01/24/2019 20:14   DG Knee Complete 4 Views Right  Addendum Date: 01/24/2019   ADDENDUM REPORT: 01/24/2019 20:14 ADDENDUM: History could be corrected to state: BILATERAL knee injury, pinned between fork lift and freight for 1 hour, LEFT greater than RIGHT knee pain, initial encounter Electronically Signed   By: Lavonia Dana M.D.   On: 01/24/2019 20:14   Result Date: 01/24/2019 CLINICAL DATA:  BILATERAL knee injury, pain between fork lift and freight for 1 hour, LEFT greater than RIGHT knee pain, initial encounter EXAM: RIGHT KNEE - COMPLETE 4+ VIEW COMPARISON:  None FINDINGS: Scattered soft tissue swelling greatest at proximal anterior lower leg. Osseous mineralization  normal. Joint spaces preserved. Mild degenerative changes of patellofemoral joint with irregularity of the posterior patella. No acute fracture, dislocation, or bone destruction. IMPRESSION: No acute osseous abnormalities. Electronically Signed: By: Lavonia Dana M.D. On: 01/24/2019 20:11    Procedures Procedures (including critical care time)  Medications Ordered in ED Medications - No data to display  ED Course  I have reviewed the triage vital signs and the nursing notes.  Pertinent labs & imaging results that were available during my care of the patient were reviewed by me and considered in my medical decision making (see chart for details).    MDM Rules/Calculators/A&P                      Blunt trauma to bilateral knees left.  In the right.  She is neurovascularly intact. X-rays are negative.  She has a large amount of edema and ecchymosis to her left proximal shin  CT scan will be obtained to evaluate for possible popliteal artery injury.  This shows normal blood vessel runoff  of the left leg possible posterior tibial occlusion on the right.  No bony injury. No fracture or dislocation.  Discussed with Dr. Donnetta Hutching vascular surgery who will review images. Unclear whether her posterior tibial occlusion is related to this incident.  Dr. Donnetta Hutching feels this finding is unlikely significant as she has patent run off to her ankle. It may represent issues with timing of contrast or normal anatomic variant. He states such vascular injury would be rare in absence of fracture. He does not feel that she needs specific vascular followup.   Knee immobilizer, NSAIDs, rice, crutches. Followup with her PCP and ortho PRN>  Return precautions discussed.    Final Clinical Impression(s) / ED Diagnoses Final diagnoses:  Contusion of knee, unspecified laterality, initial encounter    Rx / DC Orders ED Discharge Orders    None       Camdyn Beske, Annie Main, MD 01/25/19 0139

## 2019-01-24 NOTE — ED Triage Notes (Addendum)
Pt c/o bil knee injury , " pinned b/w forklift and freight " x 1 hr ago

## 2019-01-25 MED ORDER — IBUPROFEN 600 MG PO TABS: 600.0000 mg | ORAL_TABLET | Freq: Four times a day (QID) | ORAL | 0 refills | Status: DC | PRN

## 2019-01-25 MED ORDER — METHOCARBAMOL 500 MG PO TABS: 500.0000 mg | ORAL_TABLET | Freq: Three times a day (TID) | ORAL | 0 refills | Status: DC | PRN

## 2019-03-06 DIAGNOSIS — L638 Other alopecia areata: Secondary | ICD-10-CM | POA: Diagnosis not present

## 2019-04-27 ENCOUNTER — Other Ambulatory Visit: Payer: Self-pay | Admitting: Adult Health

## 2019-04-27 DIAGNOSIS — U071 COVID-19: Secondary | ICD-10-CM | POA: Diagnosis not present

## 2019-04-27 DIAGNOSIS — R05 Cough: Secondary | ICD-10-CM | POA: Diagnosis not present

## 2019-04-27 MED ORDER — SODIUM CHLORIDE 0.9 % IV SOLN
Freq: Once | INTRAVENOUS | Status: DC
  Filled 2019-04-27: qty 20

## 2019-04-27 NOTE — Progress Notes (Signed)
  I connected by phone with Erica [name] on 04/27/2019 at 2:04 PM to discuss the potential use of an new treatment for mild to moderate COVID-19 viral infection in non-hospitalized patients.  This patient is a 49 y.o. female that meets the FDA criteria for Emergency Use Authorization of bamlanivimab/etesevimab or casirivimab/imdevimab.  Has a (+) direct SARS-CoV-2 viral test result  Has mild or moderate COVID-19   Is ? 49 years of age and weighs ? 40 kg  Is NOT hospitalized due to COVID-19  Is NOT requiring oxygen therapy or requiring an increase in baseline oxygen flow rate due to COVID-19  Is within 10 days of symptom onset  Has at least one of the high risk factor(s) for progression to severe COVID-19 and/or hospitalization as defined in EUA.  Specific high risk criteria : BMI >/= 35   I have spoken and communicated the following to the patient or parent/caregiver:  1. FDA has authorized the emergency use of bamlanivimab/etesevimab and casirivimab\imdevimab for the treatment of mild to moderate COVID-19 in adults and pediatric patients with positive results of direct SARS-CoV-2 viral testing who are 64 years of age and older weighing at least 40 kg, and who are at high risk for progressing to severe COVID-19 and/or hospitalization.  2. The significant known and potential risks and benefits of bamlanivimab/etesevimab and casirivimab\imdevimab, and the extent to which such potential risks and benefits are unknown.  3. Information on available alternative treatments and the risks and benefits of those alternatives, including clinical trials.  4. Patients treated with bamlanivimab/etesevimab and casirivimab\imdevimab should continue to self-isolate and use infection control measures (e.g., wear mask, isolate, social distance, avoid sharing personal items, clean and disinfect "high touch" surfaces, and frequent handwashing) according to CDC guidelines.   5. The patient or parent/caregiver  has the option to accept or refuse bamlanivimab/etesevimab or casirivimab\imdevimab .  After reviewing this information with the patient, The patient agreed to proceed with receiving the bamlanimivab infusion and will be provided a copy of the Fact sheet prior to receiving the infusion.Scot Dock 04/27/2019 2:04 PM

## 2019-04-28 ENCOUNTER — Other Ambulatory Visit: Payer: Self-pay

## 2019-04-28 ENCOUNTER — Ambulatory Visit (HOSPITAL_COMMUNITY)
Admission: RE | Admit: 2019-04-28 | Discharge: 2019-04-28 | Disposition: A | Discharge status: Home / Self Care | Payer: Managed Care, Other (non HMO) | Source: Ambulatory Visit | Attending: Pulmonary Disease | Admitting: Pulmonary Disease

## 2019-04-28 ENCOUNTER — Emergency Department (HOSPITAL_COMMUNITY): Payer: Managed Care, Other (non HMO)

## 2019-04-28 ENCOUNTER — Inpatient Hospital Stay (HOSPITAL_COMMUNITY)
Admission: EM | Admit: 2019-04-28 | Discharge: 2019-05-14 | DRG: 177 | Disposition: A | Discharge status: Home Health | Payer: Managed Care, Other (non HMO) | Source: Ambulatory Visit | Attending: Internal Medicine | Admitting: Internal Medicine

## 2019-04-28 ENCOUNTER — Encounter (HOSPITAL_COMMUNITY): Payer: Self-pay | Admitting: Emergency Medicine

## 2019-04-28 DIAGNOSIS — Z793 Long term (current) use of hormonal contraceptives: Secondary | ICD-10-CM | POA: Diagnosis not present

## 2019-04-28 DIAGNOSIS — R739 Hyperglycemia, unspecified: Secondary | ICD-10-CM | POA: Diagnosis present

## 2019-04-28 DIAGNOSIS — E669 Obesity, unspecified: Secondary | ICD-10-CM | POA: Diagnosis present

## 2019-04-28 DIAGNOSIS — Z825 Family history of asthma and other chronic lower respiratory diseases: Secondary | ICD-10-CM | POA: Diagnosis not present

## 2019-04-28 DIAGNOSIS — J939 Pneumothorax, unspecified: Secondary | ICD-10-CM | POA: Diagnosis not present

## 2019-04-28 DIAGNOSIS — Z8249 Family history of ischemic heart disease and other diseases of the circulatory system: Secondary | ICD-10-CM | POA: Diagnosis not present

## 2019-04-28 DIAGNOSIS — Z79899 Other long term (current) drug therapy: Secondary | ICD-10-CM | POA: Diagnosis not present

## 2019-04-28 DIAGNOSIS — J690 Pneumonitis due to inhalation of food and vomit: Secondary | ICD-10-CM | POA: Diagnosis not present

## 2019-04-28 DIAGNOSIS — Z8619 Personal history of other infectious and parasitic diseases: Secondary | ICD-10-CM

## 2019-04-28 DIAGNOSIS — J9601 Acute respiratory failure with hypoxia: Secondary | ICD-10-CM | POA: Diagnosis present

## 2019-04-28 DIAGNOSIS — U071 COVID-19: Secondary | ICD-10-CM

## 2019-04-28 DIAGNOSIS — B9561 Methicillin susceptible Staphylococcus aureus infection as the cause of diseases classified elsewhere: Secondary | ICD-10-CM | POA: Diagnosis not present

## 2019-04-28 DIAGNOSIS — R609 Edema, unspecified: Secondary | ICD-10-CM | POA: Diagnosis not present

## 2019-04-28 DIAGNOSIS — R7881 Bacteremia: Secondary | ICD-10-CM | POA: Diagnosis not present

## 2019-04-28 DIAGNOSIS — R7989 Other specified abnormal findings of blood chemistry: Secondary | ICD-10-CM | POA: Diagnosis present

## 2019-04-28 DIAGNOSIS — M7989 Other specified soft tissue disorders: Secondary | ICD-10-CM | POA: Diagnosis not present

## 2019-04-28 DIAGNOSIS — J1282 Pneumonia due to coronavirus disease 2019: Secondary | ICD-10-CM | POA: Diagnosis present

## 2019-04-28 DIAGNOSIS — Z791 Long term (current) use of non-steroidal anti-inflammatories (NSAID): Secondary | ICD-10-CM | POA: Diagnosis not present

## 2019-04-28 DIAGNOSIS — I5030 Unspecified diastolic (congestive) heart failure: Secondary | ICD-10-CM | POA: Diagnosis not present

## 2019-04-28 DIAGNOSIS — Z6837 Body mass index (BMI) 37.0-37.9, adult: Secondary | ICD-10-CM | POA: Diagnosis not present

## 2019-04-28 DIAGNOSIS — J189 Pneumonia, unspecified organism: Secondary | ICD-10-CM | POA: Diagnosis not present

## 2019-04-28 DIAGNOSIS — R7303 Prediabetes: Secondary | ICD-10-CM | POA: Diagnosis present

## 2019-04-28 DIAGNOSIS — E860 Dehydration: Secondary | ICD-10-CM | POA: Diagnosis present

## 2019-04-28 DIAGNOSIS — J982 Interstitial emphysema: Secondary | ICD-10-CM | POA: Diagnosis not present

## 2019-04-28 DIAGNOSIS — Z713 Dietary counseling and surveillance: Secondary | ICD-10-CM

## 2019-04-28 DIAGNOSIS — J15211 Pneumonia due to Methicillin susceptible Staphylococcus aureus: Secondary | ICD-10-CM | POA: Diagnosis present

## 2019-04-28 DIAGNOSIS — R0602 Shortness of breath: Secondary | ICD-10-CM

## 2019-04-28 HISTORY — DX: COVID-19: U07.1

## 2019-04-28 LAB — CBC
HCT: 48.4 % — ABNORMAL HIGH (ref 36.0–46.0)
Hemoglobin: 15.8 g/dL — ABNORMAL HIGH (ref 12.0–15.0)
MCH: 29.6 pg (ref 26.0–34.0)
MCHC: 32.6 g/dL (ref 30.0–36.0)
MCV: 90.8 fL (ref 80.0–100.0)
Platelets: 222 10*3/uL (ref 150–400)
RBC: 5.33 MIL/uL — ABNORMAL HIGH (ref 3.87–5.11)
RDW: 13.8 % (ref 11.5–15.5)
WBC: 5.8 10*3/uL (ref 4.0–10.5)
nRBC: 0 % (ref 0.0–0.2)

## 2019-04-28 LAB — LACTATE DEHYDROGENASE: LDH: 980 U/L — ABNORMAL HIGH (ref 98–192)

## 2019-04-28 LAB — BASIC METABOLIC PANEL
Anion gap: 13 (ref 5–15)
BUN: 13 mg/dL (ref 6–20)
CO2: 25 mmol/L (ref 22–32)
Calcium: 8.4 mg/dL — ABNORMAL LOW (ref 8.9–10.3)
Chloride: 96 mmol/L — ABNORMAL LOW (ref 98–111)
Creatinine, Ser: 1.02 mg/dL — ABNORMAL HIGH (ref 0.44–1.00)
GFR calc Af Amer: >60 mL/min (ref >60)
GFR calc non Af Amer: >60 mL/min (ref >60)
Glucose, Bld: 154 mg/dL — ABNORMAL HIGH (ref 70–99)
Potassium: 4.3 mmol/L (ref 3.5–5.1)
Sodium: 134 mmol/L — ABNORMAL LOW (ref 135–145)

## 2019-04-28 LAB — D-DIMER, QUANTITATIVE: D-Dimer, Quant: 2.11 ug/mL-FEU — ABNORMAL HIGH (ref 0.00–0.50)

## 2019-04-28 LAB — HEPATIC FUNCTION PANEL
ALT: 103 U/L — ABNORMAL HIGH (ref 0–44)
AST: 157 U/L — ABNORMAL HIGH (ref 15–41)
Albumin: 3.2 g/dL — ABNORMAL LOW (ref 3.5–5.0)
Alkaline Phosphatase: 55 U/L (ref 38–126)
Bilirubin, Direct: 0.3 mg/dL — ABNORMAL HIGH (ref 0.0–0.2)
Indirect Bilirubin: 0.5 mg/dL (ref 0.3–0.9)
Total Bilirubin: 0.8 mg/dL (ref 0.3–1.2)
Total Protein: 6.8 g/dL (ref 6.5–8.1)

## 2019-04-28 LAB — FERRITIN: Ferritin: 1569 ng/mL — ABNORMAL HIGH (ref 11–307)

## 2019-04-28 LAB — LACTIC ACID, PLASMA
Lactic Acid, Venous: 2.2 mmol/L (ref 0.5–1.9)
Lactic Acid, Venous: 1.9 mmol/L (ref 0.5–1.9)

## 2019-04-28 LAB — C-REACTIVE PROTEIN: CRP: 6.6 mg/dL — ABNORMAL HIGH (ref <1.0)

## 2019-04-28 LAB — FIBRINOGEN: Fibrinogen: 535 mg/dL — ABNORMAL HIGH (ref 210–475)

## 2019-04-28 LAB — HIV ANTIBODY (ROUTINE TESTING W REFLEX): HIV Screen 4th Generation wRfx: NONREACTIVE

## 2019-04-28 LAB — TRIGLYCERIDES: Triglycerides: 169 mg/dL — ABNORMAL HIGH (ref <150)

## 2019-04-28 LAB — PROCALCITONIN: Procalcitonin: <0.1 ng/mL

## 2019-04-28 LAB — I-STAT BETA HCG BLOOD, ED (MC, WL, AP ONLY): I-stat hCG, quantitative: <5 m[IU]/mL (ref <5)

## 2019-04-28 LAB — HEMOGLOBIN A1C
Hgb A1c MFr Bld: 6.2 % — ABNORMAL HIGH (ref 4.8–5.6)
Mean Plasma Glucose: 131.24 mg/dL

## 2019-04-28 LAB — ABO/RH: ABO/RH(D): O POS

## 2019-04-28 MED ORDER — SODIUM CHLORIDE 0.9 % IV SOLN: INTRAVENOUS | Status: AC

## 2019-04-28 MED ORDER — ENOXAPARIN SODIUM 40 MG/0.4ML ~~LOC~~ SOLN
40.0000 mg | SUBCUTANEOUS | Status: DC
  Filled 2019-04-28: qty 0.4

## 2019-04-28 MED ORDER — EPINEPHRINE 0.3 MG/0.3ML IJ SOAJ: 0.3000 mg | Freq: Once | INTRAMUSCULAR | Status: DC | PRN

## 2019-04-28 MED ORDER — ACETAMINOPHEN 325 MG PO TABS: 650.0000 mg | ORAL_TABLET | Freq: Four times a day (QID) | ORAL | Status: DC | PRN

## 2019-04-28 MED ORDER — ALBUTEROL SULFATE HFA 108 (90 BASE) MCG/ACT IN AERS
2.0000 | INHALATION_SPRAY | Freq: Four times a day (QID) | RESPIRATORY_TRACT | Status: DC
  Administered 2019-04-28 – 2019-05-14 (×61): 2 via RESPIRATORY_TRACT
  Filled 2019-04-28: qty 6.7

## 2019-04-28 MED ORDER — SODIUM CHLORIDE 0.9 % IV SOLN
200.0000 mg | Freq: Once | INTRAVENOUS | Status: DC
  Filled 2019-04-28: qty 40

## 2019-04-28 MED ORDER — SODIUM CHLORIDE 0.9% FLUSH: 3.0000 mL | Freq: Once | INTRAVENOUS | Status: DC

## 2019-04-28 MED ORDER — ALBUTEROL SULFATE HFA 108 (90 BASE) MCG/ACT IN AERS: 2.0000 | INHALATION_SPRAY | Freq: Once | RESPIRATORY_TRACT | Status: DC | PRN

## 2019-04-28 MED ORDER — INSULIN ASPART 100 UNIT/ML ~~LOC~~ SOLN
0.0000 [IU] | Freq: Three times a day (TID) | SUBCUTANEOUS | Status: DC
  Administered 2019-04-29: 18:00:00 2 [IU] via SUBCUTANEOUS
  Administered 2019-04-29: 1 [IU] via SUBCUTANEOUS
  Administered 2019-04-29: 13:00:00 2 [IU] via SUBCUTANEOUS

## 2019-04-28 MED ORDER — METHYLPREDNISOLONE SODIUM SUCC 125 MG IJ SOLR: 125.0000 mg | Freq: Once | INTRAMUSCULAR | Status: DC | PRN

## 2019-04-28 MED ORDER — DIPHENHYDRAMINE HCL 50 MG/ML IJ SOLN: 50.0000 mg | Freq: Once | INTRAMUSCULAR | Status: DC | PRN

## 2019-04-28 MED ORDER — GUAIFENESIN-DM 100-10 MG/5ML PO SYRP
10.0000 mL | ORAL_SOLUTION | ORAL | Status: DC | PRN
  Administered 2019-05-01 – 2019-05-12 (×24): 10 mL via ORAL
  Filled 2019-04-28 (×28): qty 10

## 2019-04-28 MED ORDER — DEXAMETHASONE 6 MG PO TABS
6.0000 mg | ORAL_TABLET | ORAL | Status: DC
  Administered 2019-04-28 – 2019-04-29 (×2): 6 mg via ORAL
  Filled 2019-04-28: qty 1
  Filled 2019-04-28: qty 2

## 2019-04-28 MED ORDER — SODIUM CHLORIDE 0.9 % IV SOLN
100.0000 mg | Freq: Every day | INTRAVENOUS | Status: DC
  Filled 2019-04-28: qty 20

## 2019-04-28 MED ORDER — SODIUM CHLORIDE 0.9 % IV SOLN: INTRAVENOUS | Status: DC | PRN

## 2019-04-28 MED ORDER — FAMOTIDINE IN NACL 20-0.9 MG/50ML-% IV SOLN: 20.0000 mg | Freq: Once | INTRAVENOUS | Status: DC | PRN

## 2019-04-28 NOTE — ED Triage Notes (Signed)
Pt COVID + 4/17. Pt to triage via [facility] from [place] infusion clinic for O2 sats 81% on room air.  Pt denies SOB.  C/o fatigue, cough, and loss of appetite.  95% on 4 liters [place].

## 2019-04-28 NOTE — ED Provider Notes (Signed)
[facility] EMERGENCY DEPARTMENT Provider Note   CSN: [id number] Arrival date & time: 04/28/19  1026     History Chief Complaint  Patient presents with  . COVID  . Shortness of Breath    Erica [name] is a 49 y.o. female.  Patient with no past cardiopulmonary history, use of NuvaRing --presents the emergency department today with hypoxia in the setting of COVID-19.  Patient states that she began to feel tired on 04/20/2019.  She was tested the following day for coronavirus and tested positive.  Patient reports slight loss of taste and smell.  She has had a cough as well.  She has had intermittent low-grade fevers.  Her breathing and shortness of breath have progressed over the past 2 days.  She states her doctor prescribed her cough medication which did not help but then prescribed hydrocodone cough syrup which did.  She went to the infusion clinic today at [facility] and was found to have an oxygen saturation of 81% on room air.  Patient was placed on 4 L nasal cannula with improvement to the low 90s.  She denies nausea, vomiting.  She has not had any chest pain or hemoptysis.  She has never smoked.  She has not received a coronavirus vaccine.  She does not have any definite coronavirus contacts but works as a Librarian, academic around multiple people.        Past Medical History:  Diagnosis Date  . BV (bacterial vaginosis) 2001  . COVID-19   . H/O gonorrhea 1995  . History of elevated lipids 03/18/2008  . HSV-2 infection 1995    Patient Active Problem List   Diagnosis Date Noted  . Possible exposure to STD 04/29/2011  . HSV-2 infection 03/31/2011    History reviewed. No pertinent surgical history.   OB History   No obstetric history on file.     Family History  Problem Relation Age of Onset  . Hypertension Maternal Grandmother   . Hypertension Maternal Grandfather   . Asthma Cousin     Social History   Tobacco Use  . Smoking status: Never Smoker  .  Smokeless tobacco: Never Used  Substance Use Topics  . Alcohol use: Yes    Comment: socially  . Drug use: No    Home Medications Prior to Admission medications   Medication Sig Start Date End Date Taking? Authorizing Provider  etonogestrel-ethinyl estradiol (NUVARING) 0.12-0.015 MG/24HR vaginal ring 1 ring every 3 weeks with one wk without ring 04/29/11   Haygood, Seymour Bars, MD  ibuprofen (ADVIL) 600 MG tablet Take 1 tablet (600 mg total) by mouth every 6 (six) hours as needed. 01/25/19   Veryl Speak, MD  methocarbamol (ROBAXIN) 500 MG tablet Take 1 tablet (500 mg total) by mouth every 8 (eight) hours as needed for muscle spasms. 01/25/19   Veryl Speak, MD  methylPREDNISolone (MEDROL) 4 MG tablet Take 1 tablet (4 mg total) by mouth daily. 08/12/18   Robyn Haber, MD  Multiple Vitamin (MULITIVITAMIN WITH MINERALS) TABS Take 1 tablet by mouth daily.    [provider]  valACYclovir (VALTREX) 500 MG tablet Take 1 tablet (500 mg total) by mouth 2 (two) times daily. 04/29/11   Haygood, Seymour Bars, MD    Allergies    Patient has no known allergies.  Review of Systems   Review of Systems  Constitutional: Positive for appetite change, chills, fatigue and fever.  HENT: Negative for rhinorrhea and sore throat.   Eyes: Negative for  redness.  Respiratory: Positive for cough and shortness of breath.   Cardiovascular: Negative for chest pain.  Gastrointestinal: Negative for abdominal pain, diarrhea, nausea and vomiting.  Genitourinary: Negative for dysuria.  Musculoskeletal: Negative for myalgias.  Skin: Negative for rash.  Neurological: Negative for headaches.    Physical Exam Updated Vital Signs BP 108/73 (BP Location: Left Arm)   Pulse 83   Temp 98.1 F (36.7 C) (Oral)   Resp 18   LMP 04/06/2019   SpO2 (!) 89%   Physical Exam Vitals and nursing note reviewed.  Constitutional:      Appearance: She is well-developed.  HENT:     Head: Normocephalic and atraumatic.    Eyes:     General:        Right eye: No discharge.        Left eye: No discharge.     Conjunctiva/sclera: Conjunctivae normal.  Cardiovascular:     Rate and Rhythm: Normal rate and regular rhythm.     Heart sounds: Normal heart sounds.  Pulmonary:     Effort: Pulmonary effort is normal. Tachypnea present. No accessory muscle usage or respiratory distress.     Breath sounds: Examination of the right-upper field reveals rales. Examination of the left-upper field reveals rales. Examination of the right-middle field reveals rales. Examination of the left-middle field reveals rales. Examination of the right-lower field reveals rales. Examination of the left-lower field reveals rales. Rales (Worse at bases) present. No decreased breath sounds, wheezing or rhonchi.  Abdominal:     Palpations: Abdomen is soft.     Tenderness: There is no abdominal tenderness.  Musculoskeletal:     Cervical back: Normal range of motion and neck supple.  Skin:    General: Skin is warm and dry.  Neurological:     Mental Status: She is alert.     ED Results / Procedures / Treatments   Labs (all labs ordered are listed, but only abnormal results are displayed) Labs Reviewed  BASIC METABOLIC PANEL - Abnormal; Notable for the following components:      Result Value   Sodium 134 (*)    Chloride 96 (*)    Glucose, Bld 154 (*)    Creatinine, Ser 1.02 (*)    Calcium 8.4 (*)    All other components within normal limits  CBC - Abnormal; Notable for the following components:   RBC 5.33 (*)    Hemoglobin 15.8 (*)    HCT 48.4 (*)    All other components within normal limits  LACTIC ACID, PLASMA - Abnormal; Notable for the following components:   Lactic Acid, Venous 2.2 (*)    All other components within normal limits  D-DIMER, QUANTITATIVE (NOT AT [facility]) - Abnormal; Notable for the following components:   D-Dimer, Quant 2.11 (*)    All other components within normal limits  FIBRINOGEN - Abnormal; Notable for  the following components:   Fibrinogen 535 (*)    All other components within normal limits  CULTURE, BLOOD (ROUTINE X 2)  CULTURE, BLOOD (ROUTINE X 2)  URINALYSIS, ROUTINE W REFLEX MICROSCOPIC  LACTIC ACID, PLASMA  PROCALCITONIN  LACTATE DEHYDROGENASE  HEPATIC FUNCTION PANEL  TRIGLYCERIDES  C-REACTIVE PROTEIN  FERRITIN  I-STAT BETA HCG BLOOD, ED ([facility], [facility], [facility] ONLY)    EKG EKG Interpretation  Date/Time:  Saturday April 28 2019 10:32:50 EDT Ventricular Rate:  89 PR Interval:  164 QRS Duration: 78 QT Interval:  366 QTC Calculation: 445 R Axis:   -38 Text Interpretation:  Normal sinus rhythm Left axis deviation Anterior infarct , age undetermined Abnormal ECG No old tracing to compare Confirmed by Aletta Edouard [phone]) on 04/28/2019 2:55:21 PM   Radiology DG Chest Port 1 View  Result Date: 04/28/2019 CLINICAL DATA:  COVID-19 positive, short of breath, hypoxia EXAM: PORTABLE CHEST 1 VIEW COMPARISON:  None. FINDINGS: Single frontal view of the chest demonstrates an unremarkable cardiac silhouette. Diffuse interstitial prominence with patchy bilateral ground-glass opacities greatest at the lung bases. No effusion or pneumothorax. No acute bony abnormalities. IMPRESSION: 1. Multifocal pneumonia compatible with COVID-19.  The Electronically Signed   By: Randa Ngo M.D.   On: 04/28/2019 16:47    Procedures Procedures (including critical care time)  Medications Ordered in ED Medications  sodium chloride flush (NS) 0.9 % injection 3 mL (3 mLs Intravenous Not Given 04/28/19 1526)    ED Course  I have reviewed the triage vital signs and the nursing notes.  Pertinent labs & imaging results that were available during my care of the patient were reviewed by me and considered in my medical decision making (see chart for details).  Patient seen and examined. Work-up initiated.   Vital signs reviewed and are as follows: BP 108/73 (BP Location: Left Arm)   Pulse 83   Temp 98.1 F  (36.7 C) (Oral)   Resp 18   LMP 04/06/2019   SpO2 (!) 89%   Patient will require admission to the hospital for hypoxic respiratory failure in setting of COVID-19.  Low concern for PE at this point.  She is requiring 4 L of oxygen to maintain saturations greater than 90%.  Exam is clinically consistent with multifocal pneumonia.  Awaiting chest x-ray.  5:01 PM CXR is consistent with multifocal pneumonia due to coronavirus.  Patient will be admitted to the hospital for further treatment and oxygen supplementation.  Clinical Course as of Apr 27 1621  Sat Apr 24, 281  [id number] 49 year old female recently diagnosed with Covid was at the infusion center where they found her to be hypoxic.  She is complaining of increased shortness of breath and nonproductive cough.  Getting labs chest x-ray EKG and will likely need admission for her hypoxia Covid   [MB]    Clinical Course User Index [MB] Hayden Rasmussen, MD   5:26 PM Spoke with Dr. Roosevelt Locks, [facility], who will see.    Jeannie C Tarpey was evaluated in Emergency Department on 04/28/2019 for the symptoms described in the history of present illness. She was evaluated in the context of the global COVID-19 pandemic, which necessitated consideration that the patient might be at risk for infection with the SARS-CoV-2 virus that causes COVID-19. Institutional protocols and algorithms that pertain to the evaluation of patients at risk for COVID-19 are in a state of rapid change based on information released by regulatory bodies including the CDC and federal and state organizations. These policies and algorithms were followed during the patient's care in the ED.  CRITICAL CARE Performed by: Carlisle Cater PA-C Total critical care time: 30 minutes Critical care time was exclusive of separately billable procedures and treating other patients. Critical care was necessary to treat or prevent imminent or life-threatening deterioration. Critical care was time  spent personally by me on the following activities: development of treatment plan with patient and/or surrogate as well as nursing, discussions with consultants, evaluation of patient's response to treatment, examination of patient, obtaining history from patient or surrogate, ordering and performing treatments and interventions, ordering and review of laboratory  studies, ordering and review of radiographic studies, pulse oximetry and re-evaluation of patient's condition.   MDM Rules/Calculators/A&P                      Admit.   Final Clinical Impression(s) / ED Diagnoses Final diagnoses:  Acute respiratory failure with hypoxia [facility])  Multifocal pneumonia  COVID-19    Rx / DC Orders ED Discharge Orders    None       Carlisle Cater, PA-C 04/28/19 1726    Hayden Rasmussen, MD 04/28/19 2227

## 2019-04-28 NOTE — H&P (Addendum)
History and Physical    Erica [name] [id number] DOB: 08-23-1970 DOA: 04/28/2019  PCP: Alroy Dust, L.Marlou Sa, MD   Patient coming from: Home  I have personally briefly reviewed patient's old medical records in [place]  Chief Complaint: SOB  HPI: Erica [name] is a 49 y.o. female with no significant past medical history presents the emergency department today with increasing short of breath.  Her symptoms started on 04/20/2019, initially was only feeling tired but no short of breath.    Next day, 4/17, she was tested positive for COVID-19. Patient started to have intermittent cough, loss of taste muscle aching as well.  She has had intermittent low-grade fevers.  Her breathing and shortness of breath have progressed over the past 2 days.  She contacted her PCP, who ordered hydrocodone cough syrup, which she took without significant improvement worsening symptoms.  She went to the infusion clinic today at [facility] and was found to have an oxygen saturation of 81% on room air.  Patient was placed on 4 L nasal cannula with improvement to the low 90s. Shift feels she contracted a virus to her job. ED Course: Oxygenation stabilized at 4 L, but still has significant tachypnea, x-ray showed bilateral multifocal infiltrates  Review of Systems: As per HPI otherwise 10 point review of systems negative.    Past Medical History:  Diagnosis Date  . BV (bacterial vaginosis) 2001  . COVID-19   . H/O gonorrhea 1995  . History of elevated lipids 03/18/2008  . HSV-2 infection 1995    History reviewed. No pertinent surgical history.   reports that she has never smoked. She has never used smokeless tobacco. She reports current alcohol use. She reports that she does not use drugs.  No Known Allergies  Family History  Problem Relation Age of Onset  . Hypertension Maternal Grandmother   . Hypertension Maternal Grandfather   . Asthma Cousin      Prior to Admission medications   Medication  Sig Start Date End Date Taking? Authorizing Provider  etonogestrel-ethinyl estradiol (NUVARING) 0.12-0.015 MG/24HR vaginal ring 1 ring every 3 weeks with one wk without ring 04/29/11   Haygood, Seymour Bars, MD  ibuprofen (ADVIL) 600 MG tablet Take 1 tablet (600 mg total) by mouth every 6 (six) hours as needed. 01/25/19   Veryl Speak, MD  methocarbamol (ROBAXIN) 500 MG tablet Take 1 tablet (500 mg total) by mouth every 8 (eight) hours as needed for muscle spasms. 01/25/19   Veryl Speak, MD  methylPREDNISolone (MEDROL) 4 MG tablet Take 1 tablet (4 mg total) by mouth daily. 08/12/18   Robyn Haber, MD  Multiple Vitamin (MULITIVITAMIN WITH MINERALS) TABS Take 1 tablet by mouth daily.    [provider]  valACYclovir (VALTREX) 500 MG tablet Take 1 tablet (500 mg total) by mouth 2 (two) times daily. 04/29/11   Eldred Manges, MD    Physical Exam: Vitals:   04/28/19 1629 04/28/19 1645 04/28/19 1715 04/28/19 1722  BP:  133/79 138/81   Pulse: 89   88  Resp: (!) 35 (!) 39    Temp:      TempSrc:      SpO2: 94%   93%    Constitutional: NAD, calm, comfortable Vitals:   04/28/19 1629 04/28/19 1645 04/28/19 1715 04/28/19 1722  BP:  133/79 138/81   Pulse: 89   88  Resp: (!) 35 (!) 39    Temp:      TempSrc:  SpO2: 94%   93%   Eyes: PERRL, lids and conjunctivae normal ENMT: Mucous membranes are dry. Posterior pharynx clear of any exudate or lesions.Normal dentition.  Neck: normal, supple, no masses, no thyromegaly Respiratory: Tachypneic clear to auscultation bilaterally, no wheezing, no crackles.  Increased respiratory effort. No accessory muscle use.  Cardiovascular: Regular rate and rhythm, no murmurs / rubs / gallops. No extremity edema. 2+ pedal pulses. No carotid bruits.  Abdomen: no tenderness, no masses palpated. No hepatosplenomegaly. Bowel sounds positive.  Musculoskeletal: no clubbing / cyanosis. No joint deformity upper and lower extremities. Good ROM, no contractures.  Normal muscle tone.  Skin: no rashes, lesions, ulcers. No induration Neurologic: CN 2-12 grossly intact. Sensation intact, DTR normal. Strength 5/5 in all 4.  Psychiatric: Normal judgment and insight. Alert and oriented x 3. Normal mood.    Labs on Admission: I have personally reviewed following labs and imaging studies  CBC: Recent Labs  Lab 04/28/19 1041  WBC 5.8  HGB 15.8*  HCT 48.4*  MCV 90.8  PLT [id number]   Basic Metabolic Panel: Recent Labs  Lab 04/28/19 1041  NA 134*  K 4.3  CL 96*  CO2 25  GLUCOSE 154*  BUN 13  CREATININE 1.02*  CALCIUM 8.4*   GFR: CrCl cannot be calculated (Unknown ideal weight.). Liver Function Tests: Recent Labs  Lab 04/28/19 1620  AST 157*  ALT 103*  ALKPHOS 55  BILITOT 0.8  PROT 6.8  ALBUMIN 3.2*   No results for input(s): LIPASE, AMYLASE in the last 168 hours. No results for input(s): AMMONIA in the last 168 hours. Coagulation Profile: No results for input(s): INR, PROTIME in the last 168 hours. Cardiac Enzymes: No results for input(s): CKTOTAL, CKMB, CKMBINDEX, TROPONINI in the last 168 hours. BNP (last 3 results) No results for input(s): PROBNP in the last 8760 hours. HbA1C: No results for input(s): HGBA1C in the last 72 hours. CBG: No results for input(s): GLUCAP in the last 168 hours. Lipid Profile: Recent Labs    04/28/19 1620  TRIG 169*   Thyroid Function Tests: No results for input(s): TSH, T4TOTAL, FREET4, T3FREE, THYROIDAB in the last 72 hours. Anemia Panel: No results for input(s): VITAMINB12, FOLATE, FERRITIN, TIBC, IRON, RETICCTPCT in the last 72 hours. Urine analysis: No results found for: COLORURINE, APPEARANCEUR, [place], [place], GLUCOSEU, HGBUR, BILIRUBINUR, KETONESUR, PROTEINUR, UROBILINOGEN, NITRITE, LEUKOCYTESUR  Radiological Exams on Admission: DG Chest Port 1 View  Result Date: 04/28/2019 CLINICAL DATA:  COVID-19 positive, short of breath, hypoxia EXAM: PORTABLE CHEST 1 VIEW COMPARISON:  None.  FINDINGS: Single frontal view of the chest demonstrates an unremarkable cardiac silhouette. Diffuse interstitial prominence with patchy bilateral ground-glass opacities greatest at the lung bases. No effusion or pneumothorax. No acute bony abnormalities. IMPRESSION: 1. Multifocal pneumonia compatible with COVID-19.  The Electronically Signed   By: Randa Ngo M.D.   On: 04/28/2019 16:47    EKG: Independently reviewed.  Sinus tachycardia  Assessment/Plan Active Problems:   COVID-19 virus infection   COVID-19  Acute hypoxic respiratory failure secondary to COVID 19 pneumonia Remdesivir and steroid Other supportive measures including things in mind, Tussin Encourage proning  Impending sepsis From COVID-19 pneumonia, management as above  Elevated glucose No previous history of diabetes, check A1c, will cover with sliding scale since patient will be on steroids  Dehydration Normal saline at 100  Elevation of D-dimer From Covid infection, trend D-dimer tomorrow   DVT prophylaxis: Lovenox Code Status: Full code Family Communication: None at bedside Disposition Plan: Depends  on clinical progress Consults called: None Admission status: Telemetry admission   Lequita Halt MD [facility] Pager [phone]    04/28/2019, 5:37 PM

## 2019-04-28 NOTE — ED Notes (Signed)
Called pharmacy for remdesivir. Pharmacy is looking for it.

## 2019-04-28 NOTE — Progress Notes (Signed)
Pt arrived to Covid infusion clinic for Bamlanivimab infusion. Pt A&Ox4 RN assisted pt to recliner. v/s taken, O2 81% on RA. Pt given 8 mins, then reassessment of O2 84%, gave 4L O2 via nasal canula. MD called. RN advised to call EMS. Pt reassessment on 4L was 92% O2. EMS arrived and assessed pt. Pt assisted to stretcher. Pt D/C with EMS.

## 2019-04-29 DIAGNOSIS — J9601 Acute respiratory failure with hypoxia: Secondary | ICD-10-CM

## 2019-04-29 DIAGNOSIS — J189 Pneumonia, unspecified organism: Secondary | ICD-10-CM

## 2019-04-29 DIAGNOSIS — U071 COVID-19: Principal | ICD-10-CM

## 2019-04-29 LAB — GLUCOSE, CAPILLARY
Glucose-Capillary: 119 mg/dL — ABNORMAL HIGH (ref 70–99)
Glucose-Capillary: 137 mg/dL — ABNORMAL HIGH (ref 70–99)
Glucose-Capillary: 149 mg/dL — ABNORMAL HIGH (ref 70–99)
Glucose-Capillary: 160 mg/dL — ABNORMAL HIGH (ref 70–99)
Glucose-Capillary: 182 mg/dL — ABNORMAL HIGH (ref 70–99)

## 2019-04-29 LAB — C-REACTIVE PROTEIN: CRP: 6.7 mg/dL — ABNORMAL HIGH (ref <1.0)

## 2019-04-29 LAB — D-DIMER, QUANTITATIVE: D-Dimer, Quant: 1.81 ug/mL-FEU — ABNORMAL HIGH (ref 0.00–0.50)

## 2019-04-29 LAB — BRAIN NATRIURETIC PEPTIDE: B Natriuretic Peptide: 20.2 pg/mL (ref 0.0–100.0)

## 2019-04-29 MED ORDER — TOCILIZUMAB 400 MG/20ML IV SOLN
8.0000 mg/kg | Freq: Once | INTRAVENOUS | Status: AC
  Administered 2019-04-29: 11:00:00 824 mg via INTRAVENOUS
  Filled 2019-04-29: qty 10

## 2019-04-29 MED ORDER — ADULT MULTIVITAMIN W/MINERALS CH
1.0000 | ORAL_TABLET | Freq: Every day | ORAL | Status: DC
  Administered 2019-04-29 – 2019-05-14 (×16): 1 via ORAL
  Filled 2019-04-29 (×17): qty 1

## 2019-04-29 MED ORDER — ENOXAPARIN SODIUM 60 MG/0.6ML ~~LOC~~ SOLN
50.0000 mg | Freq: Two times a day (BID) | SUBCUTANEOUS | Status: DC
  Administered 2019-04-29 – 2019-05-07 (×18): 50 mg via SUBCUTANEOUS
  Filled 2019-04-29 (×18): qty 0.6

## 2019-04-29 MED ORDER — VALACYCLOVIR HCL 500 MG PO TABS: 500.0000 mg | ORAL_TABLET | Freq: Two times a day (BID) | ORAL | Status: DC | PRN

## 2019-04-29 MED ORDER — ORAL CARE MOUTH RINSE
15.0000 mL | Freq: Two times a day (BID) | OROMUCOSAL | Status: DC
  Administered 2019-04-30 – 2019-05-14 (×27): 15 mL via OROMUCOSAL

## 2019-04-29 MED ORDER — SODIUM CHLORIDE 0.9 % IV SOLN
100.0000 mg | INTRAVENOUS | Status: AC
  Administered 2019-04-29 (×2): 100 mg via INTRAVENOUS
  Filled 2019-04-29 (×4): qty 20

## 2019-04-29 MED ORDER — SODIUM CHLORIDE 0.9 % IV SOLN
100.0000 mg | Freq: Every day | INTRAVENOUS | Status: AC
  Administered 2019-04-30 – 2019-05-03 (×4): 100 mg via INTRAVENOUS
  Filled 2019-04-29 (×4): qty 20

## 2019-04-29 NOTE — Progress Notes (Signed)
Patient transferred from bed to chair. Extremely dyspneic on exertion. Patients saturations maintained in the low 80's for over 20 minutes following exertion. Patient's oxygen titrated to 6L [place] temporarily to raise saturations. Incentive spirometer provided and educated. Will continue to monitor.  Hiram Comber, RN 04/29/2019 8:00 AM

## 2019-04-29 NOTE — Plan of Care (Signed)
  Problem: Clinical Measurements: Goal: Ability to maintain clinical measurements within normal limits will improve Outcome: Progressing   Problem: Clinical Measurements: Goal: Respiratory complications will improve Outcome: Progressing   Problem: Clinical Measurements: Goal: Cardiovascular complication will be avoided Outcome: Progressing   

## 2019-04-29 NOTE — Progress Notes (Signed)
Admitted from ED via stretcher by RN to room 5w14. Pt is A&OX4. Has mild SOB but in NAD. POX =94% on 4L/[place]. Pt denied CP. MAE well. VS was taken and POC was reviewed with pt. Pt oriented to room. First dose of remdisivir was administered to pt.

## 2019-04-29 NOTE — Progress Notes (Signed)
PROGRESS NOTE                                                                                                                                                                                                             Patient Demographics:    Erica [name], is a 49 y.o. female, DOB - 1970/03/05, WUJ:[id number]  Outpatient Primary MD for the patient is Alroy Dust, L.Marlou Sa, MD    LOS - 1  Admit date - 04/28/2019    Chief Complaint  Patient presents with  . COVID  . Shortness of Breath       Brief Narrative  Erica [name] is a 49 y.o. female with no significant past medical history presents the emergency department today with increasing short of breath.  Her symptoms started on 04/20/2019, initially was only feeling tired but no short of breath.  She had recently tested positive on 04/21/2019 for COVID-19 pneumonia and was placed on oral steroid treatment without much benefit, came to the ER with shortness of breath and was diagnosed with acute hypoxic respiratory failure due to COVID-19 pneumonia and admitted to the hospital with 6 L nasal cannula oxygen requirement.   Subjective:    Erica [name] today has, No headache, No chest pain, No abdominal pain - No Nausea, No new weakness tingling or numbness, +ve cough and ++ SOB.   Assessment  & Plan :     1. Acute Hypoxic Resp. Failure due to Acute Covid 19 Viral Pneumonitis during the ongoing 2020 Covid 19 Pandemic - she has severe disease and was severely dyspneic on 4-6 L nasal cannula oxygen with minimal exertion which respiratory rate in mid 30s, she was already on IV steroids and remdesivir, she has consented for Actemra use and she will be started on Actemra right away today.  Her CRP is notably low due to outpatient use of prednisone.  Will be monitored closely.  Encouraged the patient to sit up in chair in the daytime use I-S and flutter valve for pulmonary toiletry and then prone in  bed when at night.  Will advance activity and titrate down oxygen as possible.  Actemra off label use - patient was told that if COVID-19 pneumonitis gets worse we might potentially use Actemra off label, patient denies any known history of active diverticulitis, tuberculosis or hepatitis, understands the risks and  benefits and wants to proceed with Actemra treatment if required.    SpO2: (!) 82 % O2 Flow Rate (L/min): 6 L/min  Recent Labs  Lab 04/28/19 1620 04/28/19 1641  CRP  --  6.6*  DDIMER 2.11*  --   PROCALCITON <0.10  --     Hepatic Function Latest Ref Rng & Units 04/28/2019  Total Protein 6.5 - 8.1 g/dL 6.8  Albumin 3.5 - 5.0 g/dL 3.2(L)  AST 15 - 41 U/L 157(H)  ALT 0 - 44 U/L 103(H)  Alk Phosphatase 38 - 126 U/L 55  Total Bilirubin 0.3 - 1.2 mg/dL 0.8  Bilirubin, Direct 0.0 - 0.2 mg/dL 0.3(H)      2.  History of shingles.  Continue home dose Valtrex.  3.  Borderline elevated D-dimer.  Will place on moderate dose Lovenox and monitor closely.  4.  Obesity.  BMI 37.  Follow with PCP.   Condition - Extremely Guarded  Family Communication  : Mother Deneise Lever [phone] 04/29/2019   Code Status : Full  Diet :   Diet Order            Diet regular Room service appropriate? Yes; Fluid consistency: Thin  Diet effective now               Disposition Plan  : Stay inpatient in the hospital for treatment of severe acute hypoxic respiratory failure due to COVID-19 pneumonia.  Finish treatment course.  Consults  :  None  Procedures  :  None  PUD Prophylaxis : None  DVT Prophylaxis  :  Lovenox    Lab Results  Component Value Date   PLT 222 04/28/2019    Inpatient Medications  Scheduled Meds: . albuterol  2 puff Inhalation Q6H  . dexamethasone  6 mg Oral Q24H  . enoxaparin (LOVENOX) injection  40 mg Subcutaneous Q24H  . insulin aspart  0-9 Units Subcutaneous TID WC  . sodium chloride flush  3 mL Intravenous Once   Continuous Infusions: . sodium chloride  100 mL/hr at 04/28/19 2349  . [START ON 04/30/2019] remdesivir 100 mg in NS 100 mL    . tocilizumab (ACTEMRA) - non-COVID treatment     PRN Meds:.acetaminophen, guaiFENesin-dextromethorphan  Antibiotics  :    Anti-infectives (From admission, onward)   Start     Dose/Rate Route Frequency Ordered Stop   04/30/19 1000  remdesivir 100 mg in sodium chloride 0.9 % 100 mL IVPB     100 mg 200 mL/hr over 30 Minutes Intravenous Daily 04/29/19 0018 05/04/19 0959   04/29/19 1000  remdesivir 100 mg in sodium chloride 0.9 % 100 mL IVPB  Status:  Discontinued     100 mg 200 mL/hr over 30 Minutes Intravenous Daily 04/28/19 1736 04/29/19 0018   04/29/19 0100  remdesivir 100 mg in sodium chloride 0.9 % 100 mL IVPB     100 mg 200 mL/hr over 30 Minutes Intravenous Every 30 min 04/29/19 0018 04/29/19 0214   04/28/19 1745  remdesivir 200 mg in sodium chloride 0.9% 250 mL IVPB  Status:  Discontinued     200 mg 580 mL/hr over 30 Minutes Intravenous Once 04/28/19 1736 04/29/19 0018       Time Spent in minutes  30   Lala Lund M.D on 04/29/2019 at 9:45 AM  To page go to [email or web address] - password [email or web address]  [facility] -  Office  [phone]    See all Orders from today for further details    Objective:   Vitals:  04/29/19 0431 04/29/19 0728 04/29/19 0746 04/29/19 0843  BP: 110/70     Pulse: 75 68 77   Resp: (!) 35 (!) 27 12   Temp: 98.1 F (36.7 C)     TempSrc: Oral     SpO2: 94% (!) 85% (!) 82%   Weight:    103 kg  Height:    5' 5"  (1.651 m)    Wt Readings from Last 3 Encounters:  04/29/19 103 kg  01/24/19 106.1 kg  04/29/11 96.6 kg     Intake/Output Summary (Last 24 hours) at 04/29/2019 0945 Last data filed at 04/29/2019 0600 Gross per 24 hour  Intake 633.44 ml  Output --  Net 633.44 ml     Physical Exam  Awake Alert, No new F.N deficits, Normal affect [place].AT,PERRAL Supple Neck,No JVD, No cervical lymphadenopathy appriciated.  Symmetrical Chest wall movement, Good  air movement bilaterally, CTAB RRR,No Gallops,Rubs or new Murmurs, No Parasternal Heave +ve B.Sounds, Abd Soft, No tenderness, No organomegaly appriciated, No rebound - guarding or rigidity. No Cyanosis, Clubbing or edema, No new Rash or bruise       Data Review:    CBC Recent Labs  Lab 04/28/19 1041  WBC 5.8  HGB 15.8*  HCT 48.4*  PLT 222  MCV 90.8  MCH 29.6  MCHC 32.6  RDW 13.8    Chemistries  Recent Labs  Lab 04/28/19 1041 04/28/19 1620  NA 134*  --   K 4.3  --   CL 96*  --   CO2 25  --   GLUCOSE 154*  --   BUN 13  --   CREATININE 1.02*  --   CALCIUM 8.4*  --   AST  --  157*  ALT  --  103*  ALKPHOS  --  55  BILITOT  --  0.8  HGBA1C  --  6.2*     ------------------------------------------------------------------------------------------------------------------ Recent Labs    04/28/19 1620  TRIG 169*    Lab Results  Component Value Date   HGBA1C 6.2 (H) 04/28/2019   ------------------------------------------------------------------------------------------------------------------ No results for input(s): TSH, T4TOTAL, T3FREE, THYROIDAB in the last 72 hours.  Invalid input(s): FREET3  Cardiac Enzymes No results for input(s): CKMB, TROPONINI, MYOGLOBIN in the last 168 hours.  Invalid input(s): CK ------------------------------------------------------------------------------------------------------------------ No results found for: BNP  Micro Results No results found for this or any previous visit (from the past 240 hour(s)).  Radiology Reports DG Chest Port 1 View  Result Date: 04/28/2019 CLINICAL DATA:  COVID-19 positive, short of breath, hypoxia EXAM: PORTABLE CHEST 1 VIEW COMPARISON:  None. FINDINGS: Single frontal view of the chest demonstrates an unremarkable cardiac silhouette. Diffuse interstitial prominence with patchy bilateral ground-glass opacities greatest at the lung bases. No effusion or pneumothorax. No acute bony abnormalities.  IMPRESSION: 1. Multifocal pneumonia compatible with COVID-19.  The Electronically Signed   By: Randa Ngo M.D.   On: 04/28/2019 16:47

## 2019-04-29 NOTE — ED Notes (Signed)
Report given to Rose RN.

## 2019-04-30 LAB — CBC WITH DIFFERENTIAL/PLATELET
Abs Immature Granulocytes: 0.17 10*3/uL — ABNORMAL HIGH (ref 0.00–0.07)
Basophils Absolute: 0 10*3/uL (ref 0.0–0.1)
Basophils Relative: 1 %
Eosinophils Absolute: 0 10*3/uL (ref 0.0–0.5)
Eosinophils Relative: 0 %
HCT: 45.6 % (ref 36.0–46.0)
Hemoglobin: 14.8 g/dL (ref 12.0–15.0)
Immature Granulocytes: 4 %
Lymphocytes Relative: 28 %
Lymphs Abs: 1.4 10*3/uL (ref 0.7–4.0)
MCH: 29.5 pg (ref 26.0–34.0)
MCHC: 32.5 g/dL (ref 30.0–36.0)
MCV: 91 fL (ref 80.0–100.0)
Monocytes Absolute: 0.5 10*3/uL (ref 0.1–1.0)
Monocytes Relative: 9 %
Neutro Abs: 2.8 10*3/uL (ref 1.7–7.7)
Neutrophils Relative %: 58 %
Platelets: 265 10*3/uL (ref 150–400)
RBC: 5.01 MIL/uL (ref 3.87–5.11)
RDW: 14.1 % (ref 11.5–15.5)
WBC: 4.8 10*3/uL (ref 4.0–10.5)
nRBC: 0 % (ref 0.0–0.2)

## 2019-04-30 LAB — COMPREHENSIVE METABOLIC PANEL
ALT: 72 U/L — ABNORMAL HIGH (ref 0–44)
AST: 101 U/L — ABNORMAL HIGH (ref 15–41)
Albumin: 2.7 g/dL — ABNORMAL LOW (ref 3.5–5.0)
Alkaline Phosphatase: 52 U/L (ref 38–126)
Anion gap: 14 (ref 5–15)
BUN: 15 mg/dL (ref 6–20)
CO2: 19 mmol/L — ABNORMAL LOW (ref 22–32)
Calcium: 8.2 mg/dL — ABNORMAL LOW (ref 8.9–10.3)
Chloride: 105 mmol/L (ref 98–111)
Creatinine, Ser: 0.94 mg/dL (ref 0.44–1.00)
GFR calc Af Amer: >60 mL/min (ref >60)
GFR calc non Af Amer: >60 mL/min (ref >60)
Glucose, Bld: 160 mg/dL — ABNORMAL HIGH (ref 70–99)
Potassium: 5.1 mmol/L (ref 3.5–5.1)
Sodium: 138 mmol/L (ref 135–145)
Total Bilirubin: 1.2 mg/dL (ref 0.3–1.2)
Total Protein: 6.2 g/dL — ABNORMAL LOW (ref 6.5–8.1)

## 2019-04-30 LAB — GLUCOSE, CAPILLARY
Glucose-Capillary: 169 mg/dL — ABNORMAL HIGH (ref 70–99)
Glucose-Capillary: 248 mg/dL — ABNORMAL HIGH (ref 70–99)
Glucose-Capillary: 220 mg/dL — ABNORMAL HIGH (ref 70–99)
Glucose-Capillary: 168 mg/dL — ABNORMAL HIGH (ref 70–99)

## 2019-04-30 LAB — BRAIN NATRIURETIC PEPTIDE: B Natriuretic Peptide: 19.8 pg/mL (ref 0.0–100.0)

## 2019-04-30 LAB — D-DIMER, QUANTITATIVE: D-Dimer, Quant: 1.1 ug/mL-FEU — ABNORMAL HIGH (ref 0.00–0.50)

## 2019-04-30 LAB — MAGNESIUM: Magnesium: 2.6 mg/dL — ABNORMAL HIGH (ref 1.7–2.4)

## 2019-04-30 LAB — C-REACTIVE PROTEIN: CRP: 3.5 mg/dL — ABNORMAL HIGH (ref <1.0)

## 2019-04-30 MED ORDER — FUROSEMIDE 10 MG/ML IJ SOLN
40.0000 mg | Freq: Once | INTRAMUSCULAR | Status: AC
  Administered 2019-04-30: 10:00:00 40 mg via INTRAVENOUS
  Filled 2019-04-30: qty 4

## 2019-04-30 MED ORDER — INSULIN ASPART 100 UNIT/ML ~~LOC~~ SOLN
0.0000 [IU] | Freq: Every day | SUBCUTANEOUS | Status: DC
  Administered 2019-05-01: 21:00:00 2 [IU] via SUBCUTANEOUS

## 2019-04-30 MED ORDER — METHYLPREDNISOLONE SODIUM SUCC 125 MG IJ SOLR
60.0000 mg | Freq: Two times a day (BID) | INTRAMUSCULAR | Status: DC
  Administered 2019-04-30 – 2019-05-01 (×4): 60 mg via INTRAVENOUS
  Filled 2019-04-30 (×4): qty 2

## 2019-04-30 MED ORDER — SODIUM POLYSTYRENE SULFONATE 15 GM/60ML PO SUSP
15.0000 g | Freq: Once | ORAL | Status: AC
  Administered 2019-04-30: 15 g via ORAL
  Filled 2019-04-30: qty 60

## 2019-04-30 MED ORDER — INSULIN ASPART 100 UNIT/ML ~~LOC~~ SOLN
0.0000 [IU] | Freq: Three times a day (TID) | SUBCUTANEOUS | Status: DC
  Administered 2019-04-30: 13:00:00 5 [IU] via SUBCUTANEOUS
  Administered 2019-04-30: 08:00:00 3 [IU] via SUBCUTANEOUS
  Administered 2019-04-30: 5 [IU] via SUBCUTANEOUS
  Administered 2019-05-01: 3 [IU] via SUBCUTANEOUS
  Administered 2019-05-01 (×2): 8 [IU] via SUBCUTANEOUS
  Administered 2019-05-02 (×3): 3 [IU] via SUBCUTANEOUS
  Administered 2019-05-03: 17:00:00 8 [IU] via SUBCUTANEOUS
  Administered 2019-05-03: 5 [IU] via SUBCUTANEOUS
  Administered 2019-05-03 – 2019-05-04 (×2): 3 [IU] via SUBCUTANEOUS
  Administered 2019-05-04: 13:00:00 5 [IU] via SUBCUTANEOUS
  Administered 2019-05-04: 08:00:00 3 [IU] via SUBCUTANEOUS
  Administered 2019-05-05: 2 [IU] via SUBCUTANEOUS
  Administered 2019-05-05: 3 [IU] via SUBCUTANEOUS
  Administered 2019-05-05 – 2019-05-13 (×7): 2 [IU] via SUBCUTANEOUS

## 2019-04-30 NOTE — Progress Notes (Signed)
PROGRESS NOTE                                                                                                                                                                                                             Patient Demographics:    Erica [name], is a 49 y.o. female, DOB - 11/14/1970, WUJ:[id number]  Outpatient Primary MD for the patient is Alroy Dust, L.Marlou Sa, MD    LOS - 2  Admit date - 04/28/2019    Chief Complaint  Patient presents with   COVID   Shortness of Breath       Brief Narrative  Erica [name] is a 49 y.o. female with no significant past medical history presents the emergency department today with increasing short of breath.  Her symptoms started on 04/20/2019, initially was only feeling tired but no short of breath.  She had recently tested positive on 04/21/2019 for COVID-19 pneumonia and was placed on oral steroid treatment without much benefit, came to the ER with shortness of breath and was diagnosed with acute hypoxic respiratory failure due to COVID-19 pneumonia and admitted to the hospital with 6 L nasal cannula oxygen requirement.   Subjective:   Patient in bed, appears comfortable, denies any headache, no fever, no chest pain or pressure, improved shortness of breath , no abdominal pain. No focal weakness.   Assessment  & Plan :     1. Acute Hypoxic Resp. Failure due to Acute Covid 19 Viral Pneumonitis during the ongoing 2020 Covid 19 Pandemic - she has severe disease and was treated promptly after appropriate consents with IV Actemra on 04/29/2019 along with steroids and remdesivir, still has hypoxia but gradually improving, will continue her on IV steroids instead of oral as I still think she has lot of subclinical inflammation, her CRP is falsely down as she had taken steroids in the outpatient setting before feeling that treatment and coming to the hospital, still overall quite  tenuous.  Encouraged the patient to sit up in chair in the daytime use I-S and flutter valve for pulmonary toiletry and then prone in bed when at night.  Will advance activity and titrate  down oxygen as possible.   SpO2: 94 % O2 Flow Rate (L/min): 6 L/min  Recent Labs  Lab 04/28/19 1620 04/28/19 1641 04/29/19 0737 04/29/19 0739 04/30/19 0347 04/30/19 0734  CRP  --  6.6* 6.7*  --  3.5*  --   DDIMER 2.11*  --  1.81*  --   --  1.10*  BNP  --   --   --  20.2 19.8  --   PROCALCITON <0.10  --   --   --   --   --     Hepatic Function Latest Ref Rng & Units 04/30/2019 04/28/2019  Total Protein 6.5 - 8.1 g/dL 6.2(L) 6.8  Albumin 3.5 - 5.0 g/dL 2.7(L) 3.2(L)  AST 15 - 41 U/L 101(H) 157(H)  ALT 0 - 44 U/L 72(H) 103(H)  Alk Phosphatase 38 - 126 U/L 52 55  Total Bilirubin 0.3 - 1.2 mg/dL 1.2 0.8  Bilirubin, Direct 0.0 - 0.2 mg/dL - 0.3(H)      2.  History of shingles.  Continue home dose Valtrex.  3.  Borderline elevated D-dimer.  Will place on moderate dose Lovenox and monitor closely.  4.  Few bibasilar crackles on 04/30/2019.  Likely due to fluid overload, challenged with IV Lasix and monitor.   5. Obesity.  BMI 37.  Follow with PCP.    Condition - Extremely Guarded  Family Communication  : Mother Deneise Lever [phone] 04/29/2019   Code Status : Full  Diet :   Diet Order            Diet regular Room service appropriate? Yes; Fluid consistency: Thin  Diet effective now               Disposition Plan  : Stay inpatient in the hospital for treatment of severe acute hypoxic respiratory failure due to COVID-19 pneumonia.  Finish treatment course.  Consults  :  None  Procedures  :  None  PUD Prophylaxis : None  DVT Prophylaxis  :  Lovenox    Lab Results  Component Value Date   PLT 265 04/30/2019    Inpatient Medications  Scheduled Meds:  albuterol  2 puff Inhalation Q6H   enoxaparin (LOVENOX) injection  50 mg Subcutaneous Q12H   furosemide  40 mg Intravenous  Once   insulin aspart  0-15 Units Subcutaneous TID WC   insulin aspart  0-5 Units Subcutaneous QHS   mouth rinse  15 mL Mouth Rinse BID   methylPREDNISolone (SOLU-MEDROL) injection  60 mg Intravenous Q12H   multivitamin with minerals  1 tablet Oral Daily   sodium chloride flush  3 mL Intravenous Once   sodium polystyrene  15 g Oral Once   Continuous Infusions:  remdesivir 100 mg in NS 100 mL 100 mg (04/30/19 0811)   PRN Meds:.acetaminophen, guaiFENesin-dextromethorphan, valACYclovir  Antibiotics  :    Anti-infectives (From admission, onward)   Start     Dose/Rate Route Frequency Ordered Stop   04/30/19 1000  remdesivir 100 mg in sodium chloride 0.9 % 100 mL IVPB     100 mg 200 mL/hr over 30 Minutes Intravenous Daily 04/29/19 0018 05/04/19 0959   04/29/19 1034  valACYclovir (VALTREX) tablet 500 mg    Note to Pharmacy: Patient taking differently: Take one tablet (500 mg) by mouth twice daily for one week - as needed for breakouts     500 mg Oral 2 times daily between meals and at bedtime PRN 04/29/19 0950     04/29/19 1000  remdesivir  100 mg in sodium chloride 0.9 % 100 mL IVPB  Status:  Discontinued     100 mg 200 mL/hr over 30 Minutes Intravenous Daily 04/28/19 1736 04/29/19 0018   04/29/19 0100  remdesivir 100 mg in sodium chloride 0.9 % 100 mL IVPB     100 mg 200 mL/hr over 30 Minutes Intravenous Every 30 min 04/29/19 0018 04/29/19 0700   04/28/19 1745  remdesivir 200 mg in sodium chloride 0.9% 250 mL IVPB  Status:  Discontinued     200 mg 580 mL/hr over 30 Minutes Intravenous Once 04/28/19 1736 04/29/19 0018       Time Spent in minutes  30   Lala Lund M.D on 04/30/2019 at 9:29 AM  To page go to [email or web address] - password [email or web address]  [facility] -  Office  [phone]    See all Orders from today for further details    Objective:   Vitals:   04/29/19 2138 04/29/19 2221 04/30/19 0540 04/30/19 0546  BP: 114/70  119/80   Pulse: 76 68 79 74  Resp: (!)  25 (!) 23 (!) 35 (!) 25  Temp:   97.8 F (36.6 C)   TempSrc:   Oral   SpO2: 93% 96% 92% 94%  Weight:      Height:        Wt Readings from Last 3 Encounters:  04/29/19 103 kg  01/24/19 106.1 kg  04/29/11 96.6 kg     Intake/Output Summary (Last 24 hours) at 04/30/2019 0929 Last data filed at 04/29/2019 2200 Gross per 24 hour  Intake 1425.47 ml  Output 500 ml  Net 925.47 ml     Physical Exam  Awake Alert, No new F.N deficits, Normal affect [place].AT,PERRAL Supple Neck,No JVD, No cervical lymphadenopathy appriciated.  Symmetrical Chest wall movement, Good air movement bilaterally, few rales RRR,No Gallops, Rubs or new Murmurs, No Parasternal Heave +ve B.Sounds, Abd Soft, No tenderness, No organomegaly appriciated, No rebound - guarding or rigidity. No Cyanosis, Clubbing or edema, No new Rash or bruise   Data Review:    CBC Recent Labs  Lab 04/28/19 1041 04/30/19 0347  WBC 5.8 4.8  HGB 15.8* 14.8  HCT 48.4* 45.6  PLT 222 265  MCV 90.8 91.0  MCH 29.6 29.5  MCHC 32.6 32.5  RDW 13.8 14.1  LYMPHSABS  --  1.4  MONOABS  --  0.5  EOSABS  --  0.0  BASOSABS  --  0.0    Chemistries  Recent Labs  Lab 04/28/19 1041 04/28/19 1620 04/30/19 0347  NA 134*  --  138  K 4.3  --  5.1  CL 96*  --  105  CO2 25  --  19*  GLUCOSE 154*  --  160*  BUN 13  --  15  CREATININE 1.02*  --  0.94  CALCIUM 8.4*  --  8.2*  AST  --  157* 101*  ALT  --  103* 72*  ALKPHOS  --  55 52  BILITOT  --  0.8 1.2  MG  --   --  2.6*  HGBA1C  --  6.2*  --      ------------------------------------------------------------------------------------------------------------------ Recent Labs    04/28/19 1620  TRIG 169*    Lab Results  Component Value Date   HGBA1C 6.2 (H) 04/28/2019   ------------------------------------------------------------------------------------------------------------------ No results for input(s): TSH, T4TOTAL, T3FREE, THYROIDAB in the last 72 hours.  Invalid  input(s): FREET3  Cardiac Enzymes No results for input(s): CKMB, TROPONINI, MYOGLOBIN in the  last 168 hours.  Invalid input(s): CK ------------------------------------------------------------------------------------------------------------------    Component Value Date/Time   BNP 19.8 04/30/2019 0347    Micro Results Recent Results (from the past 240 hour(s))  Blood Culture (routine x 2)     Status: None (Preliminary result)   Collection Time: 04/28/19  4:20 PM   Specimen: BLOOD  Result Value Ref Range Status   Specimen Description BLOOD LEFT ANTECUBITAL  Final   Special Requests   Final    BOTTLES DRAWN AEROBIC AND ANAEROBIC Blood Culture adequate volume   Culture   Final    NO GROWTH < 24 HOURS Performed at [facility], [place] [address], [place], [place] [address]    Report Status PENDING  Incomplete  Blood Culture (routine x 2)     Status: None (Preliminary result)   Collection Time: 04/28/19  9:23 PM   Specimen: BLOOD  Result Value Ref Range Status   Specimen Description BLOOD LEFT ARM  Final   Special Requests   Final    BOTTLES DRAWN AEROBIC AND ANAEROBIC Blood Culture results may not be optimal due to an inadequate volume of blood received in culture bottles   Culture   Final    NO GROWTH < 24 HOURS Performed at [facility], [place] [address], [place], [place] [address]    Report Status PENDING  Incomplete    Radiology Reports DG Chest Port 1 View  Result Date: 04/28/2019 CLINICAL DATA:  COVID-19 positive, short of breath, hypoxia EXAM: PORTABLE CHEST 1 VIEW COMPARISON:  None. FINDINGS: Single frontal view of the chest demonstrates an unremarkable cardiac silhouette. Diffuse interstitial prominence with patchy bilateral ground-glass opacities greatest at the lung bases. No effusion or pneumothorax. No acute bony abnormalities. IMPRESSION: 1. Multifocal pneumonia compatible with COVID-19.  The Electronically Signed   By: Randa Ngo M.D.   On: 04/28/2019  16:47

## 2019-04-30 NOTE — Progress Notes (Signed)
Pt. On 6L [place] throughout night. Pt. Oxygen saturation in mid 90's all night with intermittent tachypnea.   Norton Blizzard, RN 04/30/2019 [phone]

## 2019-05-01 LAB — CBC WITH DIFFERENTIAL/PLATELET
Abs Immature Granulocytes: 0 10*3/uL (ref 0.00–0.07)
Basophils Absolute: 0 10*3/uL (ref 0.0–0.1)
Basophils Relative: 0 %
Eosinophils Absolute: 0 10*3/uL (ref 0.0–0.5)
Eosinophils Relative: 0 %
HCT: 44.8 % (ref 36.0–46.0)
Hemoglobin: 14.6 g/dL (ref 12.0–15.0)
Lymphocytes Relative: 10 %
Lymphs Abs: 1.1 10*3/uL (ref 0.7–4.0)
MCH: 29.8 pg (ref 26.0–34.0)
MCHC: 32.6 g/dL (ref 30.0–36.0)
MCV: 91.4 fL (ref 80.0–100.0)
Monocytes Absolute: 1.1 10*3/uL — ABNORMAL HIGH (ref 0.1–1.0)
Monocytes Relative: 10 %
Neutro Abs: 8.5 10*3/uL — ABNORMAL HIGH (ref 1.7–7.7)
Neutrophils Relative %: 80 %
Platelets: 394 10*3/uL (ref 150–400)
RBC: 4.9 MIL/uL (ref 3.87–5.11)
RDW: 13.9 % (ref 11.5–15.5)
WBC: 10.6 10*3/uL — ABNORMAL HIGH (ref 4.0–10.5)
nRBC: 0 % (ref 0.0–0.2)
nRBC: 0 /100 WBC

## 2019-05-01 LAB — COMPREHENSIVE METABOLIC PANEL
ALT: 79 U/L — ABNORMAL HIGH (ref 0–44)
AST: 72 U/L — ABNORMAL HIGH (ref 15–41)
Albumin: 2.8 g/dL — ABNORMAL LOW (ref 3.5–5.0)
Alkaline Phosphatase: 54 U/L (ref 38–126)
Anion gap: 9 (ref 5–15)
BUN: 17 mg/dL (ref 6–20)
CO2: 24 mmol/L (ref 22–32)
Calcium: 8.4 mg/dL — ABNORMAL LOW (ref 8.9–10.3)
Chloride: 104 mmol/L (ref 98–111)
Creatinine, Ser: 1.02 mg/dL — ABNORMAL HIGH (ref 0.44–1.00)
GFR calc Af Amer: >60 mL/min (ref >60)
GFR calc non Af Amer: >60 mL/min (ref >60)
Glucose, Bld: 218 mg/dL — ABNORMAL HIGH (ref 70–99)
Potassium: 4 mmol/L (ref 3.5–5.1)
Sodium: 137 mmol/L (ref 135–145)
Total Bilirubin: 0.6 mg/dL (ref 0.3–1.2)
Total Protein: 6.1 g/dL — ABNORMAL LOW (ref 6.5–8.1)

## 2019-05-01 LAB — BRAIN NATRIURETIC PEPTIDE: B Natriuretic Peptide: 31.2 pg/mL (ref 0.0–100.0)

## 2019-05-01 LAB — GLUCOSE, CAPILLARY
Glucose-Capillary: 189 mg/dL — ABNORMAL HIGH (ref 70–99)
Glucose-Capillary: 294 mg/dL — ABNORMAL HIGH (ref 70–99)
Glucose-Capillary: 272 mg/dL — ABNORMAL HIGH (ref 70–99)
Glucose-Capillary: 241 mg/dL — ABNORMAL HIGH (ref 70–99)

## 2019-05-01 LAB — C-REACTIVE PROTEIN: CRP: 1.2 mg/dL — ABNORMAL HIGH (ref <1.0)

## 2019-05-01 LAB — D-DIMER, QUANTITATIVE: D-Dimer, Quant: 1.28 ug/mL-FEU — ABNORMAL HIGH (ref 0.00–0.50)

## 2019-05-01 LAB — MAGNESIUM: Magnesium: 2.4 mg/dL (ref 1.7–2.4)

## 2019-05-01 MED ORDER — GUAIFENESIN-CODEINE 100-10 MG/5ML PO SOLN
5.0000 mL | ORAL | Status: AC | PRN
  Administered 2019-05-01: 05:00:00 5 mL via ORAL
  Filled 2019-05-01: qty 5

## 2019-05-01 NOTE — Evaluation (Signed)
Physical Therapy Evaluation Patient Details Name: Erica [name] MRN: [id number] DOB: 15-Aug-1970 Today's Date: 05/01/2019   History of Present Illness  49 year old female admitted 04/28/19 with increasing SOB. Patient's symptoms began 04/20/19 wiht feeling tired and tested positive for COVID on 04/21/19. She started having cough, loss of taste, muscle aches, intermittent low-grade fevers. Oxygen saturation 81% at [facility] infusion clinic on 04/28/19. CXR: bilateral multifocal infiltrates. Patient admitted to the hospital with acute hypoxic respiratory failure due to COVID PNA requiring 6L [place]. IV Actemra given 04/29/19 as well as steroids and Remdesivir. Borderline elevated D-dimer. Started on Lovenox.  No significant PMH.    Clinical Impression  Patient presents with decreased activity tolerance and is below her PLOF of independent and working. Patient on 10L HFNC at rest in bed with oxygen saturation 93%, HR 86 bpm. Oxygen saturation down to 82% just transferring to [facility] on 10L HFNC so increased to 15L HFNC with oxygen saturation up to 93% while seated on BSC. Patient desatted to 84% on 15L HFNC ambulating around the bed to the chair with RW. Patient placed back on 10L HFNC at end of session, still recovering from evaluation, oxygen saturation 86%. Nurse aware and will be in patient's room soon to continue to monitor patient's oxygen saturation. Pending patient's mobility progress and ability to wean down supplemental oxygen, patient may require home PT and/or use of RW at time of discharge. Final recommendations TBD closer to d/c.    Follow Up Recommendations Home health PT;Supervision - Intermittent    Equipment Recommendations  Other (comment)(TBD, possibly RW)       Precautions / Restrictions Precautions Precautions: Fall;Other (comment) Precaution Comments: monitor oxygen saturation Restrictions Weight Bearing Restrictions: No      Mobility  Bed Mobility Overal bed mobility: Needs  Assistance Bed Mobility: Supine to Sit     Supine to sit: Supervision;HOB elevated        Transfers Overall transfer level: Needs assistance Equipment used: None;Rolling walker (2 wheeled) Transfers: Sit to/from [facility] Sit to Stand: Min guard;Supervision Stand pivot transfers: Min guard;Supervision       General transfer comment: transfer EOB>BSC trial 1, sit>stand from EOB with RW trial 2, stand>sit on chair  Ambulation/Gait Ambulation/Gait assistance: Supervision Gait Distance (Feet): 10 Feet Assistive device: Rolling walker (2 wheeled) Gait Pattern/deviations: Step-through pattern Gait velocity: decreased   General Gait Details: 15L HFNC during ambulation trial around bed to chair with RW    Balance Overall balance assessment: Needs assistance Sitting-balance support: Feet supported Sitting balance-Leahy Scale: Good     Standing balance support: No upper extremity supported Standing balance-Leahy Scale: Good Standing balance comment: Patient able to complete pericare after use of BSC with close supervision to ensure safety with balance.       Pertinent Vitals/Pain Pain Assessment: No/denies pain(no complaints of pain, no signs/symptoms of pain)    Home Living Family/patient expects to be discharged to:: Private residence Living Arrangements: Parent Available Help at Discharge: Family;Friend(s) Type of Home: House Home Access: Level entry     Home Layout: One level Home Equipment: None Additional Comments: Parents are independent. Patient does not need to assist them.    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 Patient desats to 84% on 15L HFNC during short distance ambulation.    Exercises Other Exercises Other Exercises: flutter x 10 Other Exercises: incentive spirometer x 10, max 537mL   Assessment/Plan    PT Assessment Patient needs continued PT services  PT Problem List Decreased strength;Decreased activity tolerance;Decreased balance;Decreased mobility;Cardiopulmonary status limiting activity       PT Treatment Interventions DME instruction;Gait training;Functional mobility training;Therapeutic activities;Therapeutic exercise;Balance training;Patient/family education    PT Goals (Current goals can be found in the Care Plan section)  Acute Rehab PT Goals PT Goal Formulation: With patient Time For Goal Achievement: 05/14/19 Potential to Achieve Goals: Good    Frequency Min 3X/week    AM-PAC PT "6 Clicks" Mobility  Outcome Measure Help needed turning from your back to your side while in a flat bed without using bedrails?: None Help needed moving from lying on your back to sitting on the side of a flat bed without using bedrails?: A Little Help needed moving to and from a bed to a chair (including a wheelchair)?: A Little Help needed standing up from a chair using your arms (e.g., wheelchair or bedside chair)?: A Little Help needed to walk in hospital room?: A Little Help needed climbing 3-5 steps with a railing? : A Lot 6 Click Score: 18    End of Session Equipment Utilized During Treatment: Oxygen Activity Tolerance: Patient limited by fatigue;Patient tolerated treatment well Patient left: in chair;with call bell/phone within reach Nurse Communication: Mobility status;Other (comment)(oxygen saturation response) PT Visit Diagnosis: Other abnormalities of gait and  mobility (R26.89)    Time: [id number] PT Time Calculation (min) (ACUTE ONLY): 28 min   Charges:   PT Evaluation $PT Eval Moderate Complexity: 1 Mod          Birdie Hopes, PT, DPT Acute Rehab [phone] office    Birdie Hopes 05/01/2019, 1:34 PM

## 2019-05-01 NOTE — Progress Notes (Signed)
PROGRESS NOTE                                                                                                                                                                                                             Patient Demographics:    Erica [name], is a 49 y.o. female, DOB - 1971/01/04, YOV:[id number]  Outpatient Primary MD for the patient is Alroy Dust, L.Marlou Sa, MD    LOS - 3  Admit date - 04/28/2019    Chief Complaint  Patient presents with  . COVID  . Shortness of Breath       Brief Narrative  Erica [name] is a 49 y.o. female with no significant past medical history presents the emergency department today with increasing short of breath.  Her symptoms started on 04/20/2019, initially was only feeling tired but no short of breath.  She had recently tested positive on 04/21/2019 for COVID-19 pneumonia and was placed on oral steroid treatment without much benefit, came to the ER with shortness of breath and was diagnosed with acute hypoxic respiratory failure due to COVID-19 pneumonia and admitted to the hospital with 6 L nasal cannula oxygen requirement.   Subjective:   Patient in bed, appears comfortable, denies any headache, no fever, no chest pain or pressure, improving shortness of breath , no abdominal pain. No focal weakness.   Assessment  & Plan :     1. Acute Hypoxic Resp. Failure due to Acute Covid 19 Viral Pneumonitis during the ongoing 2020 Covid 19 Pandemic - she has severe disease and was treated promptly after appropriate consents with IV Actemra on 04/29/2019 along with steroids and remdesivir, still has hypoxia but gradually improving, will continue her on IV steroids instead of oral as I still think she has lot of subclinical inflammation, her CRP is falsely down as she had taken steroids in the outpatient setting before feeling that treatment and coming to the hospital, still overall quite tenuous as she has  already incurred a lot of parenchymal injury due to late presentation to the hospital with several days preceding her hypoxia.  Encouraged the patient to sit up in chair in the daytime use I-S and flutter valve for pulmonary toiletry and then prone in bed when at night.  Will advance activity and titrate down oxygen as possible.   SpO2: 92 % O2  Flow Rate (L/min): 8 L/min  Recent Labs  Lab 04/28/19 1620 04/28/19 1641 04/29/19 0737 04/29/19 0739 04/30/19 0347 04/30/19 0734 05/01/19 0700  CRP  --  6.6* 6.7*  --  3.5*  --  1.2*  DDIMER 2.11*  --  1.81*  --   --  1.10* 1.28*  BNP  --   --   --  20.2 19.8  --  31.2  PROCALCITON <0.10  --   --   --   --   --   --     Hepatic Function Latest Ref Rng & Units 05/01/2019 04/30/2019 04/28/2019  Total Protein 6.5 - 8.1 g/dL 6.1(L) 6.2(L) 6.8  Albumin 3.5 - 5.0 g/dL 2.8(L) 2.7(L) 3.2(L)  AST 15 - 41 U/L 72(H) 101(H) 157(H)  ALT 0 - 44 U/L 79(H) 72(H) 103(H)  Alk Phosphatase 38 - 126 U/L 54 52 55  Total Bilirubin 0.3 - 1.2 mg/dL 0.6 1.2 0.8  Bilirubin, Direct 0.0 - 0.2 mg/dL - - 0.3(H)      2.  History of shingles.  Currently stable does not take Valtrex anymore at home on a scheduled basis.  3.  Borderline elevated D-dimer.  Will place on moderate dose Lovenox and monitor closely.  4.  Few bibasilar crackles on 04/30/2019.  Likely due to fluid overload, improved after IV Lasix.   5. Obesity.  BMI 37.  Follow with PCP.    Condition - Extremely Guarded  Family Communication  : Mother Deneise Lever [phone] 04/29/2019   Code Status : Full  Diet :   Diet Order            Diet regular Room service appropriate? Yes; Fluid consistency: Thin  Diet effective now               Disposition Plan  : Stay inpatient in the hospital for treatment of severe acute hypoxic respiratory failure due to COVID-19 pneumonia.  Finish treatment course.  Consults  :  None  Procedures  :  None  PUD Prophylaxis : None  DVT Prophylaxis  :  Lovenox     Lab Results  Component Value Date   PLT 394 05/01/2019    Inpatient Medications  Scheduled Meds: . albuterol  2 puff Inhalation Q6H  . enoxaparin (LOVENOX) injection  50 mg Subcutaneous Q12H  . insulin aspart  0-15 Units Subcutaneous TID WC  . insulin aspart  0-5 Units Subcutaneous QHS  . mouth rinse  15 mL Mouth Rinse BID  . methylPREDNISolone (SOLU-MEDROL) injection  60 mg Intravenous Q12H  . multivitamin with minerals  1 tablet Oral Daily  . sodium chloride flush  3 mL Intravenous Once   Continuous Infusions: . remdesivir 100 mg in NS 100 mL 100 mg (05/01/19 1100)   PRN Meds:.acetaminophen, guaiFENesin-dextromethorphan, valACYclovir  Antibiotics  :    Anti-infectives (From admission, onward)   Start     Dose/Rate Route Frequency Ordered Stop   04/30/19 1000  remdesivir 100 mg in sodium chloride 0.9 % 100 mL IVPB     100 mg 200 mL/hr over 30 Minutes Intravenous Daily 04/29/19 0018 05/04/19 0959   04/29/19 1034  valACYclovir (VALTREX) tablet 500 mg    Note to Pharmacy: Patient taking differently: Take one tablet (500 mg) by mouth twice daily for one week - as needed for breakouts     500 mg Oral 2 times daily between meals and at bedtime PRN 04/29/19 0950     04/29/19 1000  remdesivir 100 mg in sodium  chloride 0.9 % 100 mL IVPB  Status:  Discontinued     100 mg 200 mL/hr over 30 Minutes Intravenous Daily 04/28/19 1736 04/29/19 0018   04/29/19 0100  remdesivir 100 mg in sodium chloride 0.9 % 100 mL IVPB     100 mg 200 mL/hr over 30 Minutes Intravenous Every 30 min 04/29/19 0018 04/29/19 0700   04/28/19 1745  remdesivir 200 mg in sodium chloride 0.9% 250 mL IVPB  Status:  Discontinued     200 mg 580 mL/hr over 30 Minutes Intravenous Once 04/28/19 1736 04/29/19 0018       Time Spent in minutes  30   Lala Lund M.D on 05/01/2019 at 11:18 AM  To page go to [email or web address] - password [email or web address]  [facility] -  Office  [phone]    See all Orders from today  for further details    Objective:   Vitals:   04/30/19 2143 05/01/19 0208 05/01/19 0349 05/01/19 0400  BP:   (!) 136/91   Pulse:  81 82   Resp:  (!) 21 (!) 23   Temp:   98.5 F (36.9 C)   TempSrc:   Oral   SpO2: 93% 93% (!) 88% 92%  Weight:      Height:        Wt Readings from Last 3 Encounters:  04/29/19 103 kg  01/24/19 106.1 kg  04/29/11 96.6 kg     Intake/Output Summary (Last 24 hours) at 05/01/2019 1118 Last data filed at 05/01/2019 0928 Gross per 24 hour  Intake 240 ml  Output 250 ml  Net -10 ml     Physical Exam  Awake Alert, No new F.N deficits, Normal affect [place].AT,PERRAL Supple Neck,No JVD, No cervical lymphadenopathy appriciated.  Symmetrical Chest wall movement, Good air movement bilaterally, CTAB RRR,No Gallops, Rubs or new Murmurs, No Parasternal Heave +ve B.Sounds, Abd Soft, No tenderness, No organomegaly appriciated, No rebound - guarding or rigidity. No Cyanosis, Clubbing or edema, No new Rash or bruise    Data Review:    CBC Recent Labs  Lab 04/28/19 1041 04/30/19 0347 05/01/19 0700  WBC 5.8 4.8 10.6*  HGB 15.8* 14.8 14.6  HCT 48.4* 45.6 44.8  PLT 222 265 394  MCV 90.8 91.0 91.4  MCH 29.6 29.5 29.8  MCHC 32.6 32.5 32.6  RDW 13.8 14.1 13.9  LYMPHSABS  --  1.4 1.1  MONOABS  --  0.5 1.1*  EOSABS  --  0.0 0.0  BASOSABS  --  0.0 0.0    Chemistries  Recent Labs  Lab 04/28/19 1041 04/28/19 1620 04/30/19 0347 05/01/19 0700  NA 134*  --  138 137  K 4.3  --  5.1 4.0  CL 96*  --  105 104  CO2 25  --  19* 24  GLUCOSE 154*  --  160* 218*  BUN 13  --  15 17  CREATININE 1.02*  --  0.94 1.02*  CALCIUM 8.4*  --  8.2* 8.4*  AST  --  157* 101* 72*  ALT  --  103* 72* 79*  ALKPHOS  --  55 52 54  BILITOT  --  0.8 1.2 0.6  MG  --   --  2.6* 2.4  HGBA1C  --  6.2*  --   --      ------------------------------------------------------------------------------------------------------------------ Recent Labs    04/28/19 1620  TRIG 169*     Lab Results  Component Value Date   HGBA1C 6.2 (H) 04/28/2019   ------------------------------------------------------------------------------------------------------------------ No  results for input(s): TSH, T4TOTAL, T3FREE, THYROIDAB in the last 72 hours.  Invalid input(s): FREET3  Cardiac Enzymes No results for input(s): CKMB, TROPONINI, MYOGLOBIN in the last 168 hours.  Invalid input(s): CK ------------------------------------------------------------------------------------------------------------------    Component Value Date/Time   BNP 31.2 05/01/2019 0700    Micro Results Recent Results (from the past 240 hour(s))  Blood Culture (routine x 2)     Status: None (Preliminary result)   Collection Time: 04/28/19  4:20 PM   Specimen: BLOOD  Result Value Ref Range Status   Specimen Description BLOOD LEFT ANTECUBITAL  Final   Special Requests   Final    BOTTLES DRAWN AEROBIC AND ANAEROBIC Blood Culture adequate volume   Culture   Final    NO GROWTH 3 DAYS Performed at [facility], [address], [place], [place] [address]    Report Status PENDING  Incomplete  Blood Culture (routine x 2)     Status: None (Preliminary result)   Collection Time: 04/28/19  9:23 PM   Specimen: BLOOD  Result Value Ref Range Status   Specimen Description BLOOD LEFT ARM  Final   Special Requests   Final    BOTTLES DRAWN AEROBIC AND ANAEROBIC Blood Culture results may not be optimal due to an inadequate volume of blood received in culture bottles   Culture   Final    NO GROWTH 3 DAYS Performed at [facility], [place] [address] [place], [place] [address]    Report Status PENDING  Incomplete    Radiology Reports DG Chest Port 1 View  Result Date: 04/28/2019 CLINICAL DATA:  COVID-19 positive, short of breath, hypoxia EXAM: PORTABLE CHEST 1 VIEW COMPARISON:  None. FINDINGS: Single frontal view of the chest demonstrates an unremarkable cardiac silhouette. Diffuse interstitial  prominence with patchy bilateral ground-glass opacities greatest at the lung bases. No effusion or pneumothorax. No acute bony abnormalities. IMPRESSION: 1. Multifocal pneumonia compatible with COVID-19.  The Electronically Signed   By: Randa Ngo M.D.   On: 04/28/2019 16:47

## 2019-05-02 LAB — CBC WITH DIFFERENTIAL/PLATELET
Abs Immature Granulocytes: 0.74 10*3/uL — ABNORMAL HIGH (ref 0.00–0.07)
Basophils Absolute: 0.1 10*3/uL (ref 0.0–0.1)
Basophils Relative: 0 %
Eosinophils Absolute: 0 10*3/uL (ref 0.0–0.5)
Eosinophils Relative: 0 %
HCT: 44.7 % (ref 36.0–46.0)
Hemoglobin: 14.8 g/dL (ref 12.0–15.0)
Immature Granulocytes: 4 %
Lymphocytes Relative: 4 %
Lymphs Abs: 0.8 10*3/uL (ref 0.7–4.0)
MCH: 30.2 pg (ref 26.0–34.0)
MCHC: 33.1 g/dL (ref 30.0–36.0)
MCV: 91.2 fL (ref 80.0–100.0)
Monocytes Absolute: 0.8 10*3/uL (ref 0.1–1.0)
Monocytes Relative: 4 %
Neutro Abs: 17.3 10*3/uL — ABNORMAL HIGH (ref 1.7–7.7)
Neutrophils Relative %: 88 %
Platelets: 409 10*3/uL — ABNORMAL HIGH (ref 150–400)
RBC: 4.9 MIL/uL (ref 3.87–5.11)
RDW: 13.7 % (ref 11.5–15.5)
WBC: 19.8 10*3/uL — ABNORMAL HIGH (ref 4.0–10.5)
nRBC: 0 % (ref 0.0–0.2)

## 2019-05-02 LAB — COMPREHENSIVE METABOLIC PANEL
ALT: 71 U/L — ABNORMAL HIGH (ref 0–44)
AST: 54 U/L — ABNORMAL HIGH (ref 15–41)
Albumin: 2.7 g/dL — ABNORMAL LOW (ref 3.5–5.0)
Alkaline Phosphatase: 58 U/L (ref 38–126)
Anion gap: 13 (ref 5–15)
BUN: 16 mg/dL (ref 6–20)
CO2: 22 mmol/L (ref 22–32)
Calcium: 8.6 mg/dL — ABNORMAL LOW (ref 8.9–10.3)
Chloride: 103 mmol/L (ref 98–111)
Creatinine, Ser: 0.95 mg/dL (ref 0.44–1.00)
GFR calc Af Amer: >60 mL/min (ref >60)
GFR calc non Af Amer: >60 mL/min (ref >60)
Glucose, Bld: 211 mg/dL — ABNORMAL HIGH (ref 70–99)
Potassium: 4.3 mmol/L (ref 3.5–5.1)
Sodium: 138 mmol/L (ref 135–145)
Total Bilirubin: 0.8 mg/dL (ref 0.3–1.2)
Total Protein: 5.8 g/dL — ABNORMAL LOW (ref 6.5–8.1)

## 2019-05-02 LAB — BRAIN NATRIURETIC PEPTIDE: B Natriuretic Peptide: 126 pg/mL — ABNORMAL HIGH (ref 0.0–100.0)

## 2019-05-02 LAB — GLUCOSE, CAPILLARY
Glucose-Capillary: 187 mg/dL — ABNORMAL HIGH (ref 70–99)
Glucose-Capillary: 188 mg/dL — ABNORMAL HIGH (ref 70–99)
Glucose-Capillary: 188 mg/dL — ABNORMAL HIGH (ref 70–99)
Glucose-Capillary: 184 mg/dL — ABNORMAL HIGH (ref 70–99)

## 2019-05-02 LAB — D-DIMER, QUANTITATIVE: D-Dimer, Quant: 1.62 ug/mL-FEU — ABNORMAL HIGH (ref 0.00–0.50)

## 2019-05-02 LAB — MAGNESIUM: Magnesium: 2.2 mg/dL (ref 1.7–2.4)

## 2019-05-02 LAB — PROCALCITONIN: Procalcitonin: <0.1 ng/mL

## 2019-05-02 LAB — C-REACTIVE PROTEIN: CRP: 0.7 mg/dL (ref <1.0)

## 2019-05-02 MED ORDER — METHYLPREDNISOLONE SODIUM SUCC 40 MG IJ SOLR
40.0000 mg | Freq: Two times a day (BID) | INTRAMUSCULAR | Status: DC
  Administered 2019-05-02 – 2019-05-04 (×5): 40 mg via INTRAVENOUS
  Filled 2019-05-02 (×5): qty 1

## 2019-05-02 MED ORDER — FUROSEMIDE 10 MG/ML IJ SOLN
40.0000 mg | Freq: Once | INTRAMUSCULAR | Status: AC
  Administered 2019-05-02: 40 mg via INTRAVENOUS
  Filled 2019-05-02: qty 4

## 2019-05-02 MED ORDER — FUROSEMIDE 40 MG PO TABS
40.0000 mg | ORAL_TABLET | Freq: Once | ORAL | Status: AC
  Administered 2019-05-02: 40 mg via ORAL
  Filled 2019-05-02: qty 1

## 2019-05-02 MED ORDER — MENTHOL 3 MG MT LOZG
1.0000 | LOZENGE | Freq: Three times a day (TID) | OROMUCOSAL | Status: DC | PRN
  Administered 2019-05-02: 3 mg via ORAL
  Filled 2019-05-02: qty 9

## 2019-05-02 NOTE — Progress Notes (Signed)
PROGRESS NOTE                                                                                                                                                                                                             Patient Demographics:    Erica [name], is a 49 y.o. female, DOB - 11/17/1970, AVW:[id number]  Outpatient Primary MD for the patient is Alroy Dust, L.Marlou Sa, MD    LOS - 4  Admit date - 04/28/2019    Chief Complaint  Patient presents with  . COVID  . Shortness of Breath       Brief Narrative  Erica [name] is a 49 y.o. female with no significant past medical history presents the emergency department today with increasing short of breath.  Her symptoms started on 04/20/2019, initially was only feeling tired but no short of breath.  She had recently tested positive on 04/21/2019 for COVID-19 pneumonia and was placed on oral steroid treatment without much benefit, came to the ER with shortness of breath and was diagnosed with acute hypoxic respiratory failure due to COVID-19 pneumonia and admitted to the hospital with 6 L nasal cannula oxygen requirement.   Subjective:   Patient in bed, appears comfortable, denies any headache, no fever, no chest pain or pressure, improved shortness of breath , no abdominal pain. No focal weakness.    Assessment  & Plan :     1. Acute Hypoxic Resp. Failure due to Acute Covid 19 Viral Pneumonitis during the ongoing 2020 Covid 19 Pandemic - she has severe disease and was treated promptly after appropriate consents with IV Actemra on 04/29/2019 along with steroids and remdesivir, still has hypoxia but gradually improving, will continue her on IV steroids instead of oral as I still think she has lot of subclinical inflammation, her CRP is falsely down as she had taken steroids in the outpatient setting before feeling that treatment and coming to the hospital, still overall quite tenuous as she has  already incurred a lot of parenchymal injury due to late presentation to the hospital with several days preceding her hypoxia.  Encouraged the patient to sit up in chair in the daytime use I-S and flutter valve for pulmonary toiletry and then prone in bed when at night.  Will advance activity and titrate down oxygen as possible.   SpO2: 95 %  O2 Flow Rate (L/min): 7 L/min  Recent Labs  Lab 04/28/19 1620 04/28/19 1641 04/29/19 0737 04/29/19 0739 04/30/19 0347 04/30/19 0734 05/01/19 0700 05/02/19 0625  CRP  --  6.6* 6.7*  --  3.5*  --  1.2* 0.7  DDIMER 2.11*  --  1.81*  --   --  1.10* 1.28* 1.62*  BNP  --   --   --  20.2 19.8  --  31.2 126.0*  PROCALCITON <0.10  --   --   --   --   --   --   --     Hepatic Function Latest Ref Rng & Units 05/02/2019 05/01/2019 04/30/2019  Total Protein 6.5 - 8.1 g/dL 5.8(L) 6.1(L) 6.2(L)  Albumin 3.5 - 5.0 g/dL 2.7(L) 2.8(L) 2.7(L)  AST 15 - 41 U/L 54(H) 72(H) 101(H)  ALT 0 - 44 U/L 71(H) 79(H) 72(H)  Alk Phosphatase 38 - 126 U/L 58 54 52  Total Bilirubin 0.3 - 1.2 mg/dL 0.8 0.6 1.2  Bilirubin, Direct 0.0 - 0.2 mg/dL - - -      2.  History of shingles.  Currently stable does not take Valtrex anymore at home on a scheduled basis.  3.  Borderline elevated D-dimer.  Will place on moderate dose Lovenox and monitor closely.  Will check lower extremity venous duplex.  4.  Few bibasilar crackles on 04/30/2019.  Repeat IV Lasix on 05/02/2019.   5. Obesity.  BMI 37.  Follow with PCP.    Condition - Extremely Guarded  Family Communication  : Mother Erica [name] [phone] 04/29/2019   Code Status : Full  Diet :   Diet Order            Diet regular Room service appropriate? Yes; Fluid consistency: Thin  Diet effective now               Disposition Plan  : Stay inpatient in the hospital for treatment of severe acute hypoxic respiratory failure due to COVID-19 pneumonia.  Finish treatment course, thereafter discharge home.  Consults  :   None  Procedures  :  None  PUD Prophylaxis : None  DVT Prophylaxis  :  Lovenox    Lab Results  Component Value Date   PLT 409 (H) 05/02/2019    Inpatient Medications  Scheduled Meds: . albuterol  2 puff Inhalation Q6H  . enoxaparin (LOVENOX) injection  50 mg Subcutaneous Q12H  . furosemide  40 mg Intravenous Once  . insulin aspart  0-15 Units Subcutaneous TID WC  . insulin aspart  0-5 Units Subcutaneous QHS  . mouth rinse  15 mL Mouth Rinse BID  . methylPREDNISolone (SOLU-MEDROL) injection  40 mg Intravenous Q12H  . multivitamin with minerals  1 tablet Oral Daily   Continuous Infusions: . remdesivir 100 mg in NS 100 mL 100 mg (05/02/19 1106)   PRN Meds:.acetaminophen, guaiFENesin-dextromethorphan, valACYclovir  Antibiotics  :    Anti-infectives (From admission, onward)   Start     Dose/Rate Route Frequency Ordered Stop   04/30/19 1000  remdesivir 100 mg in sodium chloride 0.9 % 100 mL IVPB     100 mg 200 mL/hr over 30 Minutes Intravenous Daily 04/29/19 0018 05/04/19 0959   04/29/19 1034  valACYclovir (VALTREX) tablet 500 mg    Note to Pharmacy: Patient taking differently: Take one tablet (500 mg) by mouth twice daily for one week - as needed for breakouts     500 mg Oral 2 times daily between meals and at bedtime PRN  04/29/19 0950     04/29/19 1000  remdesivir 100 mg in sodium chloride 0.9 % 100 mL IVPB  Status:  Discontinued     100 mg 200 mL/hr over 30 Minutes Intravenous Daily 04/28/19 1736 04/29/19 0018   04/29/19 0100  remdesivir 100 mg in sodium chloride 0.9 % 100 mL IVPB     100 mg 200 mL/hr over 30 Minutes Intravenous Every 30 min 04/29/19 0018 04/29/19 0700   04/28/19 1745  remdesivir 200 mg in sodium chloride 0.9% 250 mL IVPB  Status:  Discontinued     200 mg 580 mL/hr over 30 Minutes Intravenous Once 04/28/19 1736 04/29/19 0018       Time Spent in minutes  30   Lala Lund M.D on 05/02/2019 at 11:39 AM  To page go to [email or web address] - password  [facility]  [facility] -  Office  [phone]    See all Orders from today for further details    Objective:   Vitals:   05/01/19 2205 05/01/19 2206 05/02/19 0446 05/02/19 0824  BP:   123/83   Pulse: 69  77 89  Resp: (!) 31 20 (!) 27 (!) 24  Temp:   98.5 F (36.9 C) 98.2 F (36.8 C)  TempSrc:   Axillary Axillary  SpO2: 94%  95%   Weight:   108 kg   Height:        Wt Readings from Last 3 Encounters:  05/02/19 108 kg  01/24/19 106.1 kg  04/29/11 96.6 kg     Intake/Output Summary (Last 24 hours) at 05/02/2019 1139 Last data filed at 05/02/2019 1005 Gross per 24 hour  Intake 960 ml  Output 400 ml  Net 560 ml     Physical Exam  Awake Alert, No new F.N deficits, Normal affect [place].AT,PERRAL Supple Neck,No JVD, No cervical lymphadenopathy appriciated.  Symmetrical Chest wall movement, Good air movement bilaterally, few rales RRR,No Gallops, Rubs or new Murmurs, No Parasternal Heave +ve B.Sounds, Abd Soft, No tenderness, No organomegaly appriciated, No rebound - guarding or rigidity. No Cyanosis, Clubbing or edema, No new Rash or bruise   Data Review:    CBC Recent Labs  Lab 04/28/19 1041 04/30/19 0347 05/01/19 0700 05/02/19 0625  WBC 5.8 4.8 10.6* 19.8*  HGB 15.8* 14.8 14.6 14.8  HCT 48.4* 45.6 44.8 44.7  PLT 222 265 394 409*  MCV 90.8 91.0 91.4 91.2  MCH 29.6 29.5 29.8 30.2  MCHC 32.6 32.5 32.6 33.1  RDW 13.8 14.1 13.9 13.7  LYMPHSABS  --  1.4 1.1 0.8  MONOABS  --  0.5 1.1* 0.8  EOSABS  --  0.0 0.0 0.0  BASOSABS  --  0.0 0.0 0.1    Chemistries  Recent Labs  Lab 04/28/19 1041 04/28/19 1620 04/30/19 0347 05/01/19 0700 05/02/19 0625  NA 134*  --  138 137 138  K 4.3  --  5.1 4.0 4.3  CL 96*  --  105 104 103  CO2 25  --  19* 24 22  GLUCOSE 154*  --  160* 218* 211*  BUN 13  --  15 17 16   CREATININE 1.02*  --  0.94 1.02* 0.95  CALCIUM 8.4*  --  8.2* 8.4* 8.6*  AST  --  157* 101* 72* 54*  ALT  --  103* 72* 79* 71*  ALKPHOS  --  55 52 54 58   BILITOT  --  0.8 1.2 0.6 0.8  MG  --   --  2.6* 2.4 2.2  HGBA1C  --  6.2*  --   --   --      ------------------------------------------------------------------------------------------------------------------ No results for input(s): CHOL, HDL, LDLCALC, TRIG, CHOLHDL, LDLDIRECT in the last 72 hours.  Lab Results  Component Value Date   HGBA1C 6.2 (H) 04/28/2019   ------------------------------------------------------------------------------------------------------------------ No results for input(s): TSH, T4TOTAL, T3FREE, THYROIDAB in the last 72 hours.  Invalid input(s): FREET3  Cardiac Enzymes No results for input(s): CKMB, TROPONINI, MYOGLOBIN in the last 168 hours.  Invalid input(s): CK ------------------------------------------------------------------------------------------------------------------    Component Value Date/Time   BNP 126.0 (H) 05/02/2019 3790    Micro Results Recent Results (from the past 240 hour(s))  Blood Culture (routine x 2)     Status: None (Preliminary result)   Collection Time: 04/28/19  4:20 PM   Specimen: BLOOD  Result Value Ref Range Status   Specimen Description BLOOD LEFT ANTECUBITAL  Final   Special Requests   Final    BOTTLES DRAWN AEROBIC AND ANAEROBIC Blood Culture adequate volume   Culture   Final    NO GROWTH 4 DAYS Performed at [facility], [address] [place], [place] [address]    Report Status PENDING  Incomplete  Blood Culture (routine x 2)     Status: None (Preliminary result)   Collection Time: 04/28/19  9:23 PM   Specimen: BLOOD  Result Value Ref Range Status   Specimen Description BLOOD LEFT ARM  Final   Special Requests   Final    BOTTLES DRAWN AEROBIC AND ANAEROBIC Blood Culture results may not be optimal due to an inadequate volume of blood received in culture bottles   Culture   Final    NO GROWTH 4 DAYS Performed at [facility], [place] [address] [place], [place] [address]    Report Status  PENDING  Incomplete    Radiology Reports DG Chest Port 1 View  Result Date: 04/28/2019 CLINICAL DATA:  COVID-19 positive, short of breath, hypoxia EXAM: PORTABLE CHEST 1 VIEW COMPARISON:  None. FINDINGS: Single frontal view of the chest demonstrates an unremarkable cardiac silhouette. Diffuse interstitial prominence with patchy bilateral ground-glass opacities greatest at the lung bases. No effusion or pneumothorax. No acute bony abnormalities. IMPRESSION: 1. Multifocal pneumonia compatible with COVID-19.  The Electronically Signed   By: Randa Ngo M.D.   On: 04/28/2019 16:47

## 2019-05-03 ENCOUNTER — Inpatient Hospital Stay (HOSPITAL_COMMUNITY): Payer: Managed Care, Other (non HMO)

## 2019-05-03 DIAGNOSIS — R609 Edema, unspecified: Secondary | ICD-10-CM

## 2019-05-03 DIAGNOSIS — M7989 Other specified soft tissue disorders: Secondary | ICD-10-CM

## 2019-05-03 LAB — GLUCOSE, CAPILLARY
Glucose-Capillary: 176 mg/dL — ABNORMAL HIGH (ref 70–99)
Glucose-Capillary: 208 mg/dL — ABNORMAL HIGH (ref 70–99)
Glucose-Capillary: 216 mg/dL — ABNORMAL HIGH (ref 70–99)
Glucose-Capillary: 272 mg/dL — ABNORMAL HIGH (ref 70–99)
Glucose-Capillary: 199 mg/dL — ABNORMAL HIGH (ref 70–99)

## 2019-05-03 LAB — CBC WITH DIFFERENTIAL/PLATELET
Abs Immature Granulocytes: 0.67 10*3/uL — ABNORMAL HIGH (ref 0.00–0.07)
Basophils Absolute: 0.1 10*3/uL (ref 0.0–0.1)
Basophils Relative: 0 %
Eosinophils Absolute: 0.1 10*3/uL (ref 0.0–0.5)
Eosinophils Relative: 0 %
HCT: 46.3 % — ABNORMAL HIGH (ref 36.0–46.0)
Hemoglobin: 15.2 g/dL — ABNORMAL HIGH (ref 12.0–15.0)
Immature Granulocytes: 4 %
Lymphocytes Relative: 5 %
Lymphs Abs: 0.9 10*3/uL (ref 0.7–4.0)
MCH: 29.7 pg (ref 26.0–34.0)
MCHC: 32.8 g/dL (ref 30.0–36.0)
MCV: 90.4 fL (ref 80.0–100.0)
Monocytes Absolute: 0.4 10*3/uL (ref 0.1–1.0)
Monocytes Relative: 2 %
Neutro Abs: 16.2 10*3/uL — ABNORMAL HIGH (ref 1.7–7.7)
Neutrophils Relative %: 89 %
Platelets: 435 10*3/uL — ABNORMAL HIGH (ref 150–400)
RBC: 5.12 MIL/uL — ABNORMAL HIGH (ref 3.87–5.11)
RDW: 13.7 % (ref 11.5–15.5)
WBC: 18.2 10*3/uL — ABNORMAL HIGH (ref 4.0–10.5)
nRBC: 0.2 % (ref 0.0–0.2)

## 2019-05-03 LAB — CULTURE, BLOOD (ROUTINE X 2)
Culture: NO GROWTH
Culture: NO GROWTH
Special Requests: ADEQUATE

## 2019-05-03 LAB — COMPREHENSIVE METABOLIC PANEL
ALT: 69 U/L — ABNORMAL HIGH (ref 0–44)
AST: 49 U/L — ABNORMAL HIGH (ref 15–41)
Albumin: 2.8 g/dL — ABNORMAL LOW (ref 3.5–5.0)
Alkaline Phosphatase: 66 U/L (ref 38–126)
Anion gap: 14 (ref 5–15)
BUN: 19 mg/dL (ref 6–20)
CO2: 26 mmol/L (ref 22–32)
Calcium: 8.3 mg/dL — ABNORMAL LOW (ref 8.9–10.3)
Chloride: 100 mmol/L (ref 98–111)
Creatinine, Ser: 1.19 mg/dL — ABNORMAL HIGH (ref 0.44–1.00)
GFR calc Af Amer: >60 mL/min (ref >60)
GFR calc non Af Amer: 53 mL/min — ABNORMAL LOW (ref >60)
Glucose, Bld: 250 mg/dL — ABNORMAL HIGH (ref 70–99)
Potassium: 4 mmol/L (ref 3.5–5.1)
Sodium: 140 mmol/L (ref 135–145)
Total Bilirubin: 0.9 mg/dL (ref 0.3–1.2)
Total Protein: 6.1 g/dL — ABNORMAL LOW (ref 6.5–8.1)

## 2019-05-03 LAB — BRAIN NATRIURETIC PEPTIDE: B Natriuretic Peptide: 45.8 pg/mL (ref 0.0–100.0)

## 2019-05-03 LAB — C-REACTIVE PROTEIN: CRP: 0.6 mg/dL (ref <1.0)

## 2019-05-03 LAB — PROCALCITONIN: Procalcitonin: 0.11 ng/mL

## 2019-05-03 LAB — D-DIMER, QUANTITATIVE: D-Dimer, Quant: 2.23 ug/mL-FEU — ABNORMAL HIGH (ref 0.00–0.50)

## 2019-05-03 LAB — MAGNESIUM: Magnesium: 2.2 mg/dL (ref 1.7–2.4)

## 2019-05-03 MED ORDER — INSULIN GLARGINE 100 UNIT/ML ~~LOC~~ SOLN
15.0000 [IU] | Freq: Every day | SUBCUTANEOUS | Status: DC
  Administered 2019-05-03 – 2019-05-12 (×10): 15 [IU] via SUBCUTANEOUS
  Filled 2019-05-03 (×13): qty 0.15

## 2019-05-03 NOTE — Progress Notes (Signed)
Lower extremity venous has been completed.   Preliminary results in CV Proc.   Abram Sander 05/03/2019 2:21 PM

## 2019-05-03 NOTE — Progress Notes (Signed)
                                  PROGRESS NOTE                                                                                                                                                                                                             Patient Demographics:    Erica [name], is a 49 y.o. female, DOB - 03/26/1970, MRN:[id number]  Outpatient Primary MD for the patient is Mitchell, L.Dean, MD    LOS - 5  Admit date - 04/28/2019    Chief Complaint  Patient presents with  . COVID  . Shortness of Breath       Brief Narrative  Erica [name] is a 49 y.o. female with no significant past medical history presents the emergency department today with increasing short of breath.  Her symptoms started on 04/20/2019, initially was only feeling tired but no short of breath.  She had recently tested positive on 04/21/2019 for COVID-19 pneumonia and was placed on oral steroid treatment without much benefit, came to the ER with shortness of breath and was diagnosed with acute hypoxic respiratory failure due to COVID-19 pneumonia and admitted to the hospital with 6 L nasal cannula oxygen requirement.   Subjective:   Patient in bed, appears comfortable, denies any headache, no fever, no chest pain or pressure, improved shortness of breath , no abdominal pain. No focal weakness.   Assessment  & Plan :     1. Acute Hypoxic Resp. Failure due to Acute Covid 19 Viral Pneumonitis during the ongoing 2020 Covid 19 Pandemic - she has severe disease and was treated promptly after appropriate consents with IV Actemra on 04/29/2019 along with steroids and Remdesivir, she is showing slow but definite signs of improvement now, continue to monitor closely.  We will repeat chest x-ray to monitor progress.  Encouraged the patient to sit up in chair in the daytime use I-S and flutter valve for pulmonary toiletry and then prone in bed when at night.  Will advance activity and titrate down  oxygen as possible.   SpO2: 95 % O2 Flow Rate (L/min): 8 L/min  Recent Labs  Lab 04/28/19 1620 04/28/19 1641 04/29/19 0737 04/29/19 0739 04/30/19 0347 04/30/19 0734 05/01/19 0700 05/02/19 0625 05/02/19 1139 05/03/19 0344  CRP  --    < > 6.7*  --  3.5*  --  1.2* 0.7  --    0.6  DDIMER 2.11*   < > 1.81*  --   --  1.10* 1.28* 1.62*  --  2.23*  BNP  --   --   --  20.2 19.8  --  31.2 126.0*  --  45.8  PROCALCITON <0.10  --   --   --   --   --   --   --  <0.10 0.11   < > = values in this interval not displayed.    Hepatic Function Latest Ref Rng & Units 05/03/2019 05/02/2019 05/01/2019  Total Protein 6.5 - 8.1 g/dL 6.1(L) 5.8(L) 6.1(L)  Albumin 3.5 - 5.0 g/dL 2.8(L) 2.7(L) 2.8(L)  AST 15 - 41 U/L 49(H) 54(H) 72(H)  ALT 0 - 44 U/L 69(H) 71(H) 79(H)  Alk Phosphatase 38 - 126 U/L 66 58 54  Total Bilirubin 0.3 - 1.2 mg/dL 0.9 0.8 0.6  Bilirubin, Direct 0.0 - 0.2 mg/dL - - -      2.  History of shingles.  Currently stable does not take Valtrex anymore at home on a scheduled basis.  3.  Borderline elevated D-dimer.  Will place on moderate dose Lovenox and monitor closely.  Check lower extremity venous duplex, ordered and pending.  4.  Few bibasilar crackles on 04/30/2019.  Resolved after IV Lasix 2 doses.   5. Obesity.  BMI 37.  Follow with PCP.    Condition - Extremely Guarded  Family Communication  : Mother Annie [phone] 04/29/2019, 05/03/2019  Code Status : Full  Diet :   Diet Order            Diet regular Room service appropriate? Yes; Fluid consistency: Thin  Diet effective now               Disposition Plan  : Stay inpatient in the hospital for treatment of severe acute hypoxic respiratory failure due to COVID-19 pneumonia.  Finish treatment course, thereafter discharge home.  Consults  :  None  Procedures  :  None  PUD Prophylaxis : None  DVT Prophylaxis  :  Lovenox    Lab Results  Component Value Date   PLT 435 (H) 05/03/2019    Inpatient  Medications  Scheduled Meds: . albuterol  2 puff Inhalation Q6H  . enoxaparin (LOVENOX) injection  50 mg Subcutaneous Q12H  . insulin aspart  0-15 Units Subcutaneous TID WC  . insulin aspart  0-5 Units Subcutaneous QHS  . insulin glargine  15 Units Subcutaneous Daily  . mouth rinse  15 mL Mouth Rinse BID  . methylPREDNISolone (SOLU-MEDROL) injection  40 mg Intravenous Q12H  . multivitamin with minerals  1 tablet Oral Daily   Continuous Infusions:  PRN Meds:.acetaminophen, guaiFENesin-dextromethorphan, menthol-cetylpyridinium, valACYclovir  Antibiotics  :    Anti-infectives (From admission, onward)   Start     Dose/Rate Route Frequency Ordered Stop   04/30/19 1000  remdesivir 100 mg in sodium chloride 0.9 % 100 mL IVPB     100 mg 200 mL/hr over 30 Minutes Intravenous Daily 04/29/19 0018 05/03/19 0832   04/29/19 1034  valACYclovir (VALTREX) tablet 500 mg    Note to Pharmacy: Patient taking differently: Take one tablet (500 mg) by mouth twice daily for one week - as needed for breakouts     500 mg Oral 2 times daily between meals and at bedtime PRN 04/29/19 0950     04/29/19 1000  remdesivir 100 mg in sodium chloride 0.9 % 100 mL IVPB  Status:  Discontinued       100 mg 200 mL/hr over 30 Minutes Intravenous Daily 04/28/19 1736 04/29/19 0018   04/29/19 0100  remdesivir 100 mg in sodium chloride 0.9 % 100 mL IVPB     100 mg 200 mL/hr over 30 Minutes Intravenous Every 30 min 04/29/19 0018 04/29/19 0700   04/28/19 1745  remdesivir 200 mg in sodium chloride 0.9% 250 mL IVPB  Status:  Discontinued     200 mg 580 mL/hr over 30 Minutes Intravenous Once 04/28/19 1736 04/29/19 0018       Time Spent in minutes  30     M.D on 05/03/2019 at 10:24 AM  To page go to [email or web address] - password [email or web address]  [facility] -  Office  [phone]    See all Orders from today for further details    Objective:   Vitals:   05/02/19 0824 05/02/19 1100 05/02/19 1950 05/03/19 0511    BP:   (!) 91/46 115/72  Pulse: 89  92 93  Resp: (!) 24  20 (!) 35  Temp: 98.2 F (36.8 C)  97.9 F (36.6 C) 98.1 F (36.7 C)  TempSrc: Axillary  Oral Oral  SpO2:  93% 96% 95%  Weight:      Height:        Wt Readings from Last 3 Encounters:  05/02/19 108 kg  01/24/19 106.1 kg  04/29/11 96.6 kg     Intake/Output Summary (Last 24 hours) at 05/03/2019 1024 Last data filed at 05/02/2019 2131 Gross per 24 hour  Intake 480 ml  Output 850 ml  Net -370 ml     Physical Exam  Awake Alert, No new F.N deficits, Normal affect Drew.AT,PERRAL Supple Neck,No JVD, No cervical lymphadenopathy appriciated.  Symmetrical Chest wall movement, Good air movement bilaterally, CTAB RRR,No Gallops, Rubs or new Murmurs, No Parasternal Heave +ve B.Sounds, Abd Soft, No tenderness, No organomegaly appriciated, No rebound - guarding or rigidity. No Cyanosis, Clubbing or edema, No new Rash or bruise    Data Review:    CBC Recent Labs  Lab 04/28/19 1041 04/30/19 0347 05/01/19 0700 05/02/19 0625 05/03/19 0344  WBC 5.8 4.8 10.6* 19.8* 18.2*  HGB 15.8* 14.8 14.6 14.8 15.2*  HCT 48.4* 45.6 44.8 44.7 46.3*  PLT 222 265 394 409* 435*  MCV 90.8 91.0 91.4 91.2 90.4  MCH 29.6 29.5 29.8 30.2 29.7  MCHC 32.6 32.5 32.6 33.1 32.8  RDW 13.8 14.1 13.9 13.7 13.7  LYMPHSABS  --  1.4 1.1 0.8 0.9  MONOABS  --  0.5 1.1* 0.8 0.4  EOSABS  --  0.0 0.0 0.0 0.1  BASOSABS  --  0.0 0.0 0.1 0.1    Chemistries  Recent Labs  Lab 04/28/19 1041 04/28/19 1620 04/30/19 0347 05/01/19 0700 05/02/19 0625 05/03/19 0344  NA 134*  --  138 137 138 140  K 4.3  --  5.1 4.0 4.3 4.0  CL 96*  --  105 104 103 100  CO2 25  --  19* 24 22 26  GLUCOSE 154*  --  160* 218* 211* 250*  BUN 13  --  15 17 16 19  CREATININE 1.02*  --  0.94 1.02* 0.95 1.19*  CALCIUM 8.4*  --  8.2* 8.4* 8.6* 8.3*  AST  --  157* 101* 72* 54* 49*  ALT  --  103* 72* 79* 71* 69*  ALKPHOS  --  55 52 54 58 66  BILITOT  --  0.8 1.2 0.6 0.8 0.9  MG   --   --    2.6* 2.4 2.2 2.2  HGBA1C  --  6.2*  --   --   --   --      ------------------------------------------------------------------------------------------------------------------ No results for input(s): CHOL, HDL, LDLCALC, TRIG, CHOLHDL, LDLDIRECT in the last 72 hours.  Lab Results  Component Value Date   HGBA1C 6.2 (H) 04/28/2019   ------------------------------------------------------------------------------------------------------------------ No results for input(s): TSH, T4TOTAL, T3FREE, THYROIDAB in the last 72 hours.  Invalid input(s): FREET3  Cardiac Enzymes No results for input(s): CKMB, TROPONINI, MYOGLOBIN in the last 168 hours.  Invalid input(s): CK ------------------------------------------------------------------------------------------------------------------    Component Value Date/Time   BNP 45.8 05/03/2019 0344    Micro Results Recent Results (from the past 240 hour(s))  Blood Culture (routine x 2)     Status: None   Collection Time: 04/28/19  4:20 PM   Specimen: BLOOD  Result Value Ref Range Status   Specimen Description BLOOD LEFT ANTECUBITAL  Final   Special Requests   Final    BOTTLES DRAWN AEROBIC AND ANAEROBIC Blood Culture adequate volume   Culture   Final    NO GROWTH 5 DAYS Performed at [facility], [address] [place], [place] [address]    Report Status 05/03/2019 FINAL  Final  Blood Culture (routine x 2)     Status: None   Collection Time: 04/28/19  9:23 PM   Specimen: BLOOD  Result Value Ref Range Status   Specimen Description BLOOD LEFT ARM  Final   Special Requests   Final    BOTTLES DRAWN AEROBIC AND ANAEROBIC Blood Culture results may not be optimal due to an inadequate volume of blood received in culture bottles   Culture   Final    NO GROWTH 5 DAYS Performed at [facility], [address] [place], [place] [address]    Report Status 05/03/2019 FINAL  Final    Radiology Reports DG Chest Port 1  View  Result Date: 04/28/2019 CLINICAL DATA:  COVID-19 positive, short of breath, hypoxia EXAM: PORTABLE CHEST 1 VIEW COMPARISON:  None. FINDINGS: Single frontal view of the chest demonstrates an unremarkable cardiac silhouette. Diffuse interstitial prominence with patchy bilateral ground-glass opacities greatest at the lung bases. No effusion or pneumothorax. No acute bony abnormalities. IMPRESSION: 1. Multifocal pneumonia compatible with COVID-19.  The Electronically Signed   By: Michael  Brown M.D.   On: 04/28/2019 16:47     

## 2019-05-04 LAB — CBC WITH DIFFERENTIAL/PLATELET
Abs Immature Granulocytes: 0.87 10*3/uL — ABNORMAL HIGH (ref 0.00–0.07)
Basophils Absolute: 0.1 10*3/uL (ref 0.0–0.1)
Basophils Relative: 1 %
Eosinophils Absolute: 0.1 10*3/uL (ref 0.0–0.5)
Eosinophils Relative: 1 %
HCT: 47.4 % — ABNORMAL HIGH (ref 36.0–46.0)
Hemoglobin: 15.5 g/dL — ABNORMAL HIGH (ref 12.0–15.0)
Immature Granulocytes: 4 %
Lymphocytes Relative: 6 %
Lymphs Abs: 1.2 10*3/uL (ref 0.7–4.0)
MCH: 30.2 pg (ref 26.0–34.0)
MCHC: 32.7 g/dL (ref 30.0–36.0)
MCV: 92.4 fL (ref 80.0–100.0)
Monocytes Absolute: 0.5 10*3/uL (ref 0.1–1.0)
Monocytes Relative: 3 %
Neutro Abs: 18.7 10*3/uL — ABNORMAL HIGH (ref 1.7–7.7)
Neutrophils Relative %: 85 %
Platelets: 393 10*3/uL (ref 150–400)
RBC: 5.13 MIL/uL — ABNORMAL HIGH (ref 3.87–5.11)
RDW: 13.9 % (ref 11.5–15.5)
WBC: 21.5 10*3/uL — ABNORMAL HIGH (ref 4.0–10.5)
nRBC: 0.2 % (ref 0.0–0.2)

## 2019-05-04 LAB — COMPREHENSIVE METABOLIC PANEL
ALT: 66 U/L — ABNORMAL HIGH (ref 0–44)
AST: 44 U/L — ABNORMAL HIGH (ref 15–41)
Albumin: 2.9 g/dL — ABNORMAL LOW (ref 3.5–5.0)
Alkaline Phosphatase: 73 U/L (ref 38–126)
Anion gap: 13 (ref 5–15)
BUN: 17 mg/dL (ref 6–20)
CO2: 27 mmol/L (ref 22–32)
Calcium: 8.4 mg/dL — ABNORMAL LOW (ref 8.9–10.3)
Chloride: 99 mmol/L (ref 98–111)
Creatinine, Ser: 1.14 mg/dL — ABNORMAL HIGH (ref 0.44–1.00)
GFR calc Af Amer: >60 mL/min (ref >60)
GFR calc non Af Amer: 56 mL/min — ABNORMAL LOW (ref >60)
Glucose, Bld: 230 mg/dL — ABNORMAL HIGH (ref 70–99)
Potassium: 4.2 mmol/L (ref 3.5–5.1)
Sodium: 139 mmol/L (ref 135–145)
Total Bilirubin: 0.9 mg/dL (ref 0.3–1.2)
Total Protein: 5.9 g/dL — ABNORMAL LOW (ref 6.5–8.1)

## 2019-05-04 LAB — PROCALCITONIN: Procalcitonin: 0.22 ng/mL

## 2019-05-04 LAB — GLUCOSE, CAPILLARY
Glucose-Capillary: 183 mg/dL — ABNORMAL HIGH (ref 70–99)
Glucose-Capillary: 212 mg/dL — ABNORMAL HIGH (ref 70–99)
Glucose-Capillary: 195 mg/dL — ABNORMAL HIGH (ref 70–99)
Glucose-Capillary: 192 mg/dL — ABNORMAL HIGH (ref 70–99)

## 2019-05-04 LAB — C-REACTIVE PROTEIN: CRP: 0.5 mg/dL (ref <1.0)

## 2019-05-04 LAB — D-DIMER, QUANTITATIVE: D-Dimer, Quant: 2.47 ug/mL-FEU — ABNORMAL HIGH (ref 0.00–0.50)

## 2019-05-04 MED ORDER — METHYLPREDNISOLONE SODIUM SUCC 40 MG IJ SOLR: 40.0000 mg | Freq: Every day | INTRAMUSCULAR | Status: DC

## 2019-05-04 MED ORDER — APIXABAN 2.5 MG PO TABS
2.5000 mg | ORAL_TABLET | Freq: Two times a day (BID) | ORAL | 0 refills | Status: AC
Start: 2019-05-04 — End: ?

## 2019-05-04 MED FILL — ELIQUIS 2.5 MG TABLET: 2.5 | 30 days supply | Qty: 60 | Fill #0

## 2019-05-04 NOTE — Progress Notes (Signed)
SATURATION QUALIFICATIONS: (This note is used to comply with regulatory documentation for home oxygen)  Patient Saturations on Room Air at Rest = 87%  Patient Saturations on Room Air while standing marching in place= 86% Recovers with seated rest less than 1 minute on 2L HFNC  Patient Saturations on 6 Liters of oxygen HFNC while Ambulating 85ft = 89% or better  Please briefly explain why patient needs home oxygen: Patient desats at rest on room air and with mobility.   Birdie Hopes, PT, DPT Acute Rehab [phone] office

## 2019-05-04 NOTE — Progress Notes (Signed)
Physical Therapy Treatment Patient Details Name: Erica [name] MRN: [id number] DOB: 1970-06-01 Today's Date: 05/04/2019    History of Present Illness 49 year old female admitted 04/28/19 with increasing SOB. Patient's symptoms began 04/20/19 with feeling tired and tested positive for COVID on 04/21/19. She started having cough, loss of taste, muscle aches, intermittent low-grade fevers. Oxygen saturation 81% at [facility] infusion clinic on 04/28/19. CXR: bilateral multifocal infiltrates. Patient admitted to the hospital with acute hypoxic respiratory failure due to COVID PNA requiring 6L [place]. IV Actemra given 04/29/19 as well as steroids and Remdesivir. Borderline elevated D-dimer. Started on Lovenox.  No significant PMH.    PT Comments    Patient presents with improvement in mobility and need for less supplemental oxygen compared to prior PT session but still is below her PLOF and with decreased activity tolerance. Patient desats on rom air and requires 2L HFNC at rest, 6L HFNC for short distance ambulation in room. Anticipate patient will be able to further wean down on suppl oxygen and improve her mobility in order to discharge home with home PT.  At start of session on 2L HFNC:  90% At rest on room air: 87%  Standing marching in place on room air: 86%, HR 100 bpm Recovers in less than a minute seated rest on 2L HFNC to >/=90%  4L HFNC at rest: 95%  Ambulation on 4L HFNC" 84% Ambulation 1ft on 6L HFNC: 89% post or better  At end of session on 2L HFNC: 92%     Follow Up Recommendations  Home health PT;Supervision - Intermittent     Equipment Recommendations  None recommended by PT(patient denies needing BSC)       Precautions / Restrictions Precautions Precautions: Fall;Other (comment) Precaution Comments: monitor oxygen saturation Restrictions Weight Bearing Restrictions: No    Mobility  Bed Mobility General bed mobility comments: Patient OOB in recliner chair upon PT  entrance.  Transfers Overall transfer level: Needs assistance Equipment used: None Transfers: Sit to/from Stand Sit to Stand: Supervision;Modified independent (Device/Increase time)  General transfer comment: sit<>stand from chair x 2 trials, transfer on/off BSC modI  Ambulation/Gait Ambulation/Gait assistance: Supervision(close supervision) Gait Distance (Feet): 24 Feet(5 x 2) Assistive device: None Gait Pattern/deviations: Step-through pattern;Decreased step length - right;Decreased step length - left Gait velocity: decreased   General Gait Details: Patient placed on 4L HFNC for ambulation trial in room but desatted to 84% so increased to 6LHFNC for ambulation back from sink in room (approx 64ft) with oxygen saturation 89% or better.      Balance Overall balance assessment: Needs assistance Sitting-balance support: Feet supported Sitting balance-Leahy Scale: Good     Standing balance support: No upper extremity supported Standing balance-Leahy Scale: (Good-) Standing balance comment: Able to complete pericare in semi squat position without LOB, able to turn directions.     Cognition Arousal/Alertness: Awake/alert Behavior During Therapy: WFL for tasks assessed/performed Overall Cognitive Status: Within Functional Limits for tasks assessed        General Comments General comments (skin integrity, edema, etc.): Patient on 2L HFNC at rest in recliner chair, desats on 4L HFNC with short distance mobility and use of BSC, increased to 6L HFNC for ambulation in room with oxygen saturation 89% or better. Patient reports mild SOB with mobiltiy in room. Back on 2L HFNC at end of session. Education and demonstration of oxygen tube management and home O2. Education on energy conservation techniques and pacing strategies.      Pertinent Vitals/Pain Pain Assessment:  No/denies pain(No complaints of pain)           PT Goals (current goals can now be found in the care plan section)  Progress towards PT goals: Progressing toward goals    Frequency    Min 3X/week      PT Plan Current plan remains appropriate       AM-PAC PT "6 Clicks" Mobility   Outcome Measure  Help needed turning from your back to your side while in a flat bed without using bedrails?: None Help needed moving from lying on your back to sitting on the side of a flat bed without using bedrails?: None Help needed moving to and from a bed to a chair (including a wheelchair)?: A Little Help needed standing up from a chair using your arms (e.g., wheelchair or bedside chair)?: None Help needed to walk in hospital room?: A Little Help needed climbing 3-5 steps with a railing? : A Lot 6 Click Score: 20    End of Session Equipment Utilized During Treatment: Oxygen Activity Tolerance: Patient tolerated treatment well;Patient limited by fatigue Patient left: in chair;with call bell/phone within reach Nurse Communication: Mobility status;Other (comment)(oxygen need) PT Visit Diagnosis: Other abnormalities of gait and mobility (R26.89)     Time: 1501-1530 PT Time Calculation (min) (ACUTE ONLY): 29 min  Charges:  $Gait Training: 23-37 mins                    Birdie Hopes, PT, DPT Acute Rehab [phone] office    Birdie Hopes 05/04/2019, 3:54 PM

## 2019-05-04 NOTE — Progress Notes (Signed)
PROGRESS NOTE                                                                                                                                                                                                             Patient Demographics:    Erica [name], is a 49 y.o. female, DOB - 10-24-1970, FMB:[id number]  Outpatient Primary MD for the patient is Alroy Dust, L.Marlou Sa, MD    LOS - 6  Admit date - 04/28/2019    Chief Complaint  Patient presents with  . COVID  . Shortness of Breath       Brief Narrative  Erica [name] is a 49 y.o. female with no significant past medical history presents the emergency department today with increasing short of breath.  Her symptoms started on 04/20/2019, initially was only feeling tired but no short of breath.  She had recently tested positive on 04/21/2019 for COVID-19 pneumonia and was placed on oral steroid treatment without much benefit, came to the ER with shortness of breath and was diagnosed with acute hypoxic respiratory failure due to COVID-19 pneumonia and admitted to the hospital with 6 L nasal cannula oxygen requirement.   Subjective:   Patient in bed, appears comfortable, denies any headache, no fever, no chest pain or pressure, improved shortness of breath , no abdominal pain. No focal weakness.   Assessment  & Plan :     1. Acute Hypoxic Resp. Failure due to Acute Covid 19 Viral Pneumonitis during the ongoing 2020 Covid 19 Pandemic - she has severe disease and was treated promptly after appropriate consents with IV Actemra on 04/29/2019 along with steroids and Remdesivir, she has shown slow but definite improvement, currently down to 4 L nasal cannula oxygen down from 15, will continue to titrate down oxygen and advance activity.  If stable most likely discharge home on 05/05/2019 on home oxygen which has been ordered.  Encouraged the patient to sit up in chair in the daytime use I-S and  flutter valve for pulmonary toiletry and then prone in bed when at night.  Will advance activity and titrate down oxygen as possible.   Recent Labs  Lab 04/28/19 1620 04/28/19 1641 04/29/19 0737 04/29/19 0739 04/30/19 0347 04/30/19 0734 05/01/19 0700 05/02/19 0625 05/02/19 1139 05/03/19 0344 05/04/19 0502  CRP  --    < >   < >  --  3.5*  --  1.2* 0.7  --  0.6 0.5  DDIMER 2.11*   < >   < >  --   --  1.10* 1.28* 1.62*  --  2.23* 2.47*  BNP  --   --   --  20.2 19.8  --  31.2 126.0*  --  45.8  --   PROCALCITON <0.10  --   --   --   --   --   --   --  <0.10 0.11 0.22   < > = values in this interval not displayed.    Hepatic Function Latest Ref Rng & Units 05/04/2019 05/03/2019 05/02/2019  Total Protein 6.5 - 8.1 g/dL 5.9(L) 6.1(L) 5.8(L)  Albumin 3.5 - 5.0 g/dL 2.9(L) 2.8(L) 2.7(L)  AST 15 - 41 U/L 44(H) 49(H) 54(H)  ALT 0 - 44 U/L 66(H) 69(H) 71(H)  Alk Phosphatase 38 - 126 U/L 73 66 58  Total Bilirubin 0.3 - 1.2 mg/dL 0.9 0.9 0.8  Bilirubin, Direct 0.0 - 0.2 mg/dL - - -      2.  History of shingles.  Currently stable does not take Valtrex anymore at home on a scheduled basis.  3.  Borderline elevated D-dimer.  She has elevated D-dimer despite being on moderately high dose of Lovenox, lower extremity ultrasound negative, patient does have exposure to hormonal contraceptive for which I will place her on 1 month of prophylactic dose Eliquis upon discharge.  High risk for developing a clot remains very high.  4.  Few bibasilar crackles on 04/30/2019.  Resolved after IV Lasix 2 doses.   5. Obesity.  BMI 37.  Follow with PCP.    Condition - Extremely Guarded  Family Communication  : Mother Deneise Lever [phone] 04/29/2019, 05/03/2019  Code Status : Full  Diet :   Diet Order            Diet regular Room service appropriate? Yes; Fluid consistency: Thin  Diet effective now               Disposition Plan  : Stay inpatient in the hospital for treatment of severe acute hypoxic  respiratory failure due to COVID-19 pneumonia.  Finish treatment course, thereafter discharge home likely 05/05/19.  Consults  :  None  Procedures  :  None  PUD Prophylaxis : None  DVT Prophylaxis  :  Lovenox    Lab Results  Component Value Date   PLT 393 05/04/2019    Inpatient Medications  Scheduled Meds: . albuterol  2 puff Inhalation Q6H  . enoxaparin (LOVENOX) injection  50 mg Subcutaneous Q12H  . insulin aspart  0-15 Units Subcutaneous TID WC  . insulin aspart  0-5 Units Subcutaneous QHS  . insulin glargine  15 Units Subcutaneous Daily  . mouth rinse  15 mL Mouth Rinse BID  . [START ON 05/05/2019] methylPREDNISolone (SOLU-MEDROL) injection  40 mg Intravenous Daily  . multivitamin with minerals  1 tablet Oral Daily   Continuous Infusions:  PRN Meds:.acetaminophen, guaiFENesin-dextromethorphan, menthol-cetylpyridinium, valACYclovir  Antibiotics  :    Anti-infectives (From admission, onward)   Start     Dose/Rate Route Frequency Ordered Stop   04/30/19 1000  remdesivir 100 mg in sodium chloride 0.9 % 100 mL IVPB     100 mg 200 mL/hr over 30 Minutes Intravenous Daily 04/29/19 0018 05/03/19 0832   04/29/19 1034  valACYclovir (VALTREX) tablet 500 mg    Note to Pharmacy: Patient taking differently: Take one tablet (500 mg) by mouth twice daily  for one week - as needed for breakouts     500 mg Oral 2 times daily between meals and at bedtime PRN 04/29/19 0950     04/29/19 1000  remdesivir 100 mg in sodium chloride 0.9 % 100 mL IVPB  Status:  Discontinued     100 mg 200 mL/hr over 30 Minutes Intravenous Daily 04/28/19 1736 04/29/19 0018   04/29/19 0100  remdesivir 100 mg in sodium chloride 0.9 % 100 mL IVPB     100 mg 200 mL/hr over 30 Minutes Intravenous Every 30 min 04/29/19 0018 04/29/19 0700   04/28/19 1745  remdesivir 200 mg in sodium chloride 0.9% 250 mL IVPB  Status:  Discontinued     200 mg 580 mL/hr over 30 Minutes Intravenous Once 04/28/19 1736 04/29/19 0018        Time Spent in minutes  30   Lala Lund M.D on 05/04/2019 at 11:23 AM  To page go to [email or web address] - password [facility]  [facility] -  Office  [phone]    See all Orders from today for further details    Objective:   Vitals:   05/03/19 2229 05/04/19 0428 05/04/19 0430 05/04/19 0840  BP: 116/80 133/74  122/79  Pulse: 93 88  94  Resp: (!) 31 (!) 36  (!) 29  Temp: 97.7 F (36.5 C) 97.8 F (36.6 C) 97.8 F (36.6 C) 98 F (36.7 C)  TempSrc: Oral Oral  Oral  SpO2: (!) 86% 95%  95%  Weight:      Height:        Wt Readings from Last 3 Encounters:  05/02/19 108 kg  01/24/19 106.1 kg  04/29/11 96.6 kg    No intake or output data in the 24 hours ending 05/04/19 1123   Physical Exam  Awake Alert, No new F.N deficits, Normal affect [place].AT,PERRAL Supple Neck,No JVD, No cervical lymphadenopathy appriciated.  Symmetrical Chest wall movement, Good air movement bilaterally, CTAB RRR,No Gallops, Rubs or new Murmurs, No Parasternal Heave +ve B.Sounds, Abd Soft, No tenderness, No organomegaly appriciated, No rebound - guarding or rigidity. No Cyanosis, Clubbing or edema, No new Rash or bruise    Data Review:    CBC Recent Labs  Lab 04/30/19 0347 05/01/19 0700 05/02/19 0625 05/03/19 0344 05/04/19 0502  WBC 4.8 10.6* 19.8* 18.2* 21.5*  HGB 14.8 14.6 14.8 15.2* 15.5*  HCT 45.6 44.8 44.7 46.3* 47.4*  PLT 265 394 409* 435* 393  MCV 91.0 91.4 91.2 90.4 92.4  MCH 29.5 29.8 30.2 29.7 30.2  MCHC 32.5 32.6 33.1 32.8 32.7  RDW 14.1 13.9 13.7 13.7 13.9  LYMPHSABS 1.4 1.1 0.8 0.9 1.2  MONOABS 0.5 1.1* 0.8 0.4 0.5  EOSABS 0.0 0.0 0.0 0.1 0.1  BASOSABS 0.0 0.0 0.1 0.1 0.1    Chemistries  Recent Labs  Lab 04/28/19 1041 04/28/19 1620 04/30/19 0347 05/01/19 0700 05/02/19 0625 05/03/19 0344 05/04/19 0502  NA   < >  --  138 137 138 140 139  K   < >  --  5.1 4.0 4.3 4.0 4.2  CL   < >  --  105 104 103 100 99  CO2   < >  --  19* _0 GLUCOSE   < >   --  160* 218* 211* 250* 230*  BUN   < >  --  _1 CREATININE   < >  --  0.94 1.02* 0.95 1.19* 1.14*  CALCIUM   < >  --  8.2* 8.4* 8.6* 8.3* 8.4*  AST   < > 157* 101* 72* 54* 49* 44*  ALT   < > 103* 72* 79* 71* 69* 66*  ALKPHOS   < > 55 52 54 58 66 73  BILITOT   < > 0.8 1.2 0.6 0.8 0.9 0.9  MG  --   --  2.6* 2.4 2.2 2.2  --   HGBA1C  --  6.2*  --   --   --   --   --    < > = values in this interval not displayed.     ------------------------------------------------------------------------------------------------------------------ No results for input(s): CHOL, HDL, LDLCALC, TRIG, CHOLHDL, LDLDIRECT in the last 72 hours.  Lab Results  Component Value Date   HGBA1C 6.2 (H) 04/28/2019   ------------------------------------------------------------------------------------------------------------------ No results for input(s): TSH, T4TOTAL, T3FREE, THYROIDAB in the last 72 hours.  Invalid input(s): FREET3  Cardiac Enzymes No results for input(s): CKMB, TROPONINI, MYOGLOBIN in the last 168 hours.  Invalid input(s): CK ------------------------------------------------------------------------------------------------------------------    Component Value Date/Time   BNP 45.8 05/03/2019 0344    Micro Results Recent Results (from the past 240 hour(s))  Blood Culture (routine x 2)     Status: None   Collection Time: 04/28/19  4:20 PM   Specimen: BLOOD  Result Value Ref Range Status   Specimen Description BLOOD LEFT ANTECUBITAL  Final   Special Requests   Final    BOTTLES DRAWN AEROBIC AND ANAEROBIC Blood Culture adequate volume   Culture   Final    NO GROWTH 5 DAYS Performed at [facility], [address], [place], [place] [address]    Report Status 05/03/2019 FINAL  Final  Blood Culture (routine x 2)     Status: None   Collection Time: 04/28/19  9:23 PM   Specimen: BLOOD  Result Value Ref Range Status   Specimen Description BLOOD LEFT ARM  Final   Special  Requests   Final    BOTTLES DRAWN AEROBIC AND ANAEROBIC Blood Culture results may not be optimal due to an inadequate volume of blood received in culture bottles   Culture   Final    NO GROWTH 5 DAYS Performed at [facility], [place] [address] [place], [place] [address]    Report Status 05/03/2019 FINAL  Final    Radiology Reports DG Chest Port 1 View  Result Date: 05/03/2019 CLINICAL DATA:  Shortness of breath, COVID EXAM: PORTABLE CHEST 1 VIEW COMPARISON:  04/28/2019 FINDINGS: Cardiomegaly. There is interval increase in heterogeneous bilateral, bibasilar predominant airspace opacity. IMPRESSION: Interval increase in heterogeneous bilateral, bibasilar predominant airspace opacity, consistent with worsened multifocal infection and in keeping with COVID airspace disease. Electronically Signed   By: Eddie Candle M.D.   On: 05/03/2019 10:51   DG Chest Port 1 View  Result Date: 04/28/2019 CLINICAL DATA:  COVID-19 positive, short of breath, hypoxia EXAM: PORTABLE CHEST 1 VIEW COMPARISON:  None. FINDINGS: Single frontal view of the chest demonstrates an unremarkable cardiac silhouette. Diffuse interstitial prominence with patchy bilateral ground-glass opacities greatest at the lung bases. No effusion or pneumothorax. No acute bony abnormalities. IMPRESSION: 1. Multifocal pneumonia compatible with COVID-19.  The Electronically Signed   By: Randa Ngo M.D.   On: 04/28/2019 16:47   VAS [place] LOWER EXTREMITY VENOUS (DVT)  Result Date: 05/03/2019  Lower Venous DVTStudy Indications: Edema, and Swelling.  Comparison Study: no prior Performing Technologist: Abram Sander RVS  Examination Guidelines: A complete evaluation includes B-mode imaging, spectral  Doppler, color Doppler, and power Doppler as needed of all accessible portions of each vessel. Bilateral testing is considered an integral part of a complete examination. Limited examinations for reoccurring indications may be performed as noted. The  reflux portion of the exam is performed with the patient in reverse Trendelenburg.  +---------+---------------+---------+-----------+----------+--------------+ RIGHT    CompressibilityPhasicitySpontaneityPropertiesThrombus Aging +---------+---------------+---------+-----------+----------+--------------+ CFV      Full           Yes      Yes                                 +---------+---------------+---------+-----------+----------+--------------+ SFJ      Full                                                        +---------+---------------+---------+-----------+----------+--------------+ FV Prox  Full                                                        +---------+---------------+---------+-----------+----------+--------------+ FV Mid   Full                                                        +---------+---------------+---------+-----------+----------+--------------+ FV DistalFull                                                        +---------+---------------+---------+-----------+----------+--------------+ PFV      Full                                                        +---------+---------------+---------+-----------+----------+--------------+ POP      Full           Yes      Yes                                 +---------+---------------+---------+-----------+----------+--------------+ PTV      Full                                                        +---------+---------------+---------+-----------+----------+--------------+ PERO                                                  Not visualized +---------+---------------+---------+-----------+----------+--------------+   +---------+---------------+---------+-----------+----------+--------------+ LEFT  CompressibilityPhasicitySpontaneityPropertiesThrombus Aging +---------+---------------+---------+-----------+----------+--------------+ CFV      Full           Yes       Yes                                 +---------+---------------+---------+-----------+----------+--------------+ SFJ      Full                                                        +---------+---------------+---------+-----------+----------+--------------+ FV Prox  Full                                                        +---------+---------------+---------+-----------+----------+--------------+ FV Mid   Full                                                        +---------+---------------+---------+-----------+----------+--------------+ FV DistalFull                                                        +---------+---------------+---------+-----------+----------+--------------+ PFV      Full                                                        +---------+---------------+---------+-----------+----------+--------------+ POP      Full           Yes      Yes                                 +---------+---------------+---------+-----------+----------+--------------+ PTV      Full                                                        +---------+---------------+---------+-----------+----------+--------------+ PERO                                                  Not visualized +---------+---------------+---------+-----------+----------+--------------+     Summary: BILATERAL: - No evidence of deep vein thrombosis seen in the lower extremities, bilaterally.   *See table(s) above for measurements and observations. Electronically signed by Curt Jews MD on 05/03/2019 at 4:26:06 PM.    Final

## 2019-05-04 NOTE — TOC Initial Note (Addendum)
Transition of Care [facility]) - Initial/Assessment Note    Patient Details  Name: Erica [name] MRN: [id number] Date of Birth: 02/17/1970  [facility] CM/SW Contact:    Pollie Friar, RN Phone Number: 05/04/2019, 11:19 AM  Clinical Narrative:                 CM spoke with patient over the phone about [facility] services once she returns home. She was in agreement with using a [place] agency in network with her [place]. CM called [employer] and [facility] is in network. CM called Tommi Rumps with [facility] and he accepted the referral. MD please place [facility] orders prior to d/c.  CM tubed up the 30 day free coupon and $10 copay coupon for Eliquis and they are to give these to the patient. She is aware they are to provide them to her. TOC following for further d/c needs.   Expected Discharge Plan: [place] Barriers to Discharge: Continued Medical Work up   Patient Goals and CMS Choice     Choice offered to / list presented to : Patient  Expected Discharge Plan and Services Expected Discharge Plan: [place]   Discharge Planning Services: CM Consult Post Acute Care Choice: Home Health                             HH Arranged: PT [place]: [place] Date [facility] Agency Contacted: 05/04/19   Representative spoke with at [place]: Tommi Rumps  Prior Living Arrangements/Services   Lives with:: Parents Patient language and need for interpreter reviewed:: Yes Do you feel safe going back to the place where you live?: Yes      Need for Family Participation in Patient Care: Yes (Comment)(intermittent)     Criminal Activity/Legal Involvement Pertinent to Current Situation/Hospitalization: No - Comment as needed  Activities of Daily Living Home Assistive Devices/Equipment: None ADL Screening (condition at time of admission) Patient's cognitive ability adequate to safely complete daily activities?: Yes Is the patient deaf or have difficulty hearing?: No Does the  patient have difficulty seeing, even when wearing glasses/contacts?: No Does the patient have difficulty concentrating, remembering, or making decisions?: No Patient able to express need for assistance with ADLs?: No Does the patient have difficulty dressing or bathing?: Yes(desats) Independently performs ADLs?: Yes (appropriate for developmental age) Does the patient have difficulty walking or climbing stairs?: Yes(desats) Weakness of Legs: Both Weakness of Arms/Hands: None  Permission Sought/Granted                  Emotional Assessment   Attitude/Demeanor/Rapport: Engaged Affect (typically observed): Accepting Orientation: : Oriented to Self, Oriented to Place, Oriented to  Time, Oriented to Situation   Psych Involvement: No (comment)  Admission diagnosis:  Acute respiratory failure with hypoxia (HCC) [J96.01] Multifocal pneumonia [J18.9] COVID-19 [U07.1] Patient Active Problem List   Diagnosis Date Noted  . COVID-19 virus infection 04/28/2019  . COVID-19 04/28/2019  . Possible exposure to STD 04/29/2011  . 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

## 2019-05-05 ENCOUNTER — Inpatient Hospital Stay (HOSPITAL_COMMUNITY): Payer: Managed Care, Other (non HMO)

## 2019-05-05 LAB — CBC WITH DIFFERENTIAL/PLATELET
Abs Immature Granulocytes: 0.84 10*3/uL — ABNORMAL HIGH (ref 0.00–0.07)
Basophils Absolute: 0.1 10*3/uL (ref 0.0–0.1)
Basophils Relative: 0 %
Eosinophils Absolute: 0.2 10*3/uL (ref 0.0–0.5)
Eosinophils Relative: 1 %
HCT: 45.5 % (ref 36.0–46.0)
Hemoglobin: 14.9 g/dL (ref 12.0–15.0)
Immature Granulocytes: 4 %
Lymphocytes Relative: 8 %
Lymphs Abs: 1.8 10*3/uL (ref 0.7–4.0)
MCH: 29.5 pg (ref 26.0–34.0)
MCHC: 32.7 g/dL (ref 30.0–36.0)
MCV: 90.1 fL (ref 80.0–100.0)
Monocytes Absolute: 0.6 10*3/uL (ref 0.1–1.0)
Monocytes Relative: 3 %
Neutro Abs: 20.3 10*3/uL — ABNORMAL HIGH (ref 1.7–7.7)
Neutrophils Relative %: 84 %
Platelets: 406 10*3/uL — ABNORMAL HIGH (ref 150–400)
RBC: 5.05 MIL/uL (ref 3.87–5.11)
RDW: 14.1 % (ref 11.5–15.5)
WBC: 24 10*3/uL — ABNORMAL HIGH (ref 4.0–10.5)
nRBC: 0.2 % (ref 0.0–0.2)

## 2019-05-05 LAB — COMPREHENSIVE METABOLIC PANEL
ALT: 79 U/L — ABNORMAL HIGH (ref 0–44)
AST: 68 U/L — ABNORMAL HIGH (ref 15–41)
Albumin: 2.8 g/dL — ABNORMAL LOW (ref 3.5–5.0)
Alkaline Phosphatase: 80 U/L (ref 38–126)
Anion gap: 13 (ref 5–15)
BUN: 17 mg/dL (ref 6–20)
CO2: 24 mmol/L (ref 22–32)
Calcium: 8.2 mg/dL — ABNORMAL LOW (ref 8.9–10.3)
Chloride: 102 mmol/L (ref 98–111)
Creatinine, Ser: 1.08 mg/dL — ABNORMAL HIGH (ref 0.44–1.00)
GFR calc Af Amer: >60 mL/min (ref >60)
GFR calc non Af Amer: 59 mL/min — ABNORMAL LOW (ref >60)
Glucose, Bld: 132 mg/dL — ABNORMAL HIGH (ref 70–99)
Potassium: 4 mmol/L (ref 3.5–5.1)
Sodium: 139 mmol/L (ref 135–145)
Total Bilirubin: 0.9 mg/dL (ref 0.3–1.2)
Total Protein: 5.7 g/dL — ABNORMAL LOW (ref 6.5–8.1)

## 2019-05-05 LAB — D-DIMER, QUANTITATIVE: D-Dimer, Quant: 2.88 ug/mL-FEU — ABNORMAL HIGH (ref 0.00–0.50)

## 2019-05-05 LAB — GLUCOSE, CAPILLARY
Glucose-Capillary: 123 mg/dL — ABNORMAL HIGH (ref 70–99)
Glucose-Capillary: 186 mg/dL — ABNORMAL HIGH (ref 70–99)
Glucose-Capillary: 142 mg/dL — ABNORMAL HIGH (ref 70–99)
Glucose-Capillary: 130 mg/dL — ABNORMAL HIGH (ref 70–99)

## 2019-05-05 LAB — PROCALCITONIN: Procalcitonin: 0.16 ng/mL

## 2019-05-05 LAB — C-REACTIVE PROTEIN: CRP: <0.5 mg/dL (ref <1.0)

## 2019-05-05 NOTE — Progress Notes (Signed)
PROGRESS NOTE                                                                                                                                                                                                             Patient Demographics:    Erica [name], is a 49 y.o. female, DOB - 1970/11/24, JYN:[id number]  Outpatient Primary MD for the patient is Alroy Dust, L.Marlou Sa, MD    LOS - 7  Admit date - 04/28/2019    Chief Complaint  Patient presents with   COVID   Shortness of Breath       Brief Narrative  Erica [name] is a 49 y.o. female with no significant past medical history presents the emergency department today with increasing short of breath.  Her symptoms started on 04/20/2019, initially was only feeling tired but no short of breath.  She had recently tested positive on 04/21/2019 for COVID-19 pneumonia and was placed on oral steroid treatment without much benefit, came to the ER with shortness of breath and was diagnosed with acute hypoxic respiratory failure due to COVID-19 pneumonia and admitted to the hospital with 6 L nasal cannula oxygen requirement.   Subjective:   Patient in bed, appears comfortable, denies any headache, no fever, no chest pain or pressure, improved shortness of breath , no abdominal pain. No focal weakness.    Assessment  & Plan :     1. Acute Hypoxic Resp. Failure due to Acute Covid 19 Viral Pneumonitis during the ongoing 2020 Covid 19 Pandemic - she has severe disease and was treated promptly after appropriate consents with IV Actemra on 04/29/2019 along with steroids and Remdesivir, she has shown slow but definite improvement, currently down to 4 L nasal cannula oxygen down from 15, will continue to titrate down oxygen and advance activity.  If stable most likely discharge home on 05/05/2019 on home oxygen which has been ordered.  Encouraged the patient to sit up in chair in the daytime use I-S and  flutter valve for pulmonary toiletry and then prone in bed when at night.  Will advance activity and titrate down oxygen as possible.   Recent Labs  Lab 04/28/19 1620 04/28/19 1641 04/29/19 0737 04/29/19 0739 04/30/19 0347 04/30/19 0734 05/01/19 0700 05/02/19 0625 05/02/19 1139 05/03/19 0344 05/04/19 0502 05/05/19 0719  CRP  --    < >   < >  --  3.5*   < > 1.2* 0.7  --  0.6 0.5 <0.5  DDIMER 2.11*   < >  --   --   --    < > 1.28* 1.62*  --  2.23* 2.47* 2.88*  BNP  --   --   --  20.2 19.8  --  31.2 126.0*  --  45.8  --   --   PROCALCITON <0.10  --   --   --   --   --   --   --  <0.10 0.11 0.22 0.16   < > = values in this interval not displayed.    Hepatic Function Latest Ref Rng & Units 05/05/2019 05/04/2019 05/03/2019  Total Protein 6.5 - 8.1 g/dL 5.7(L) 5.9(L) 6.1(L)  Albumin 3.5 - 5.0 g/dL 2.8(L) 2.9(L) 2.8(L)  AST 15 - 41 U/L 68(H) 44(H) 49(H)  ALT 0 - 44 U/L 79(H) 66(H) 69(H)  Alk Phosphatase 38 - 126 U/L 80 73 66  Total Bilirubin 0.3 - 1.2 mg/dL 0.9 0.9 0.9  Bilirubin, Direct 0.0 - 0.2 mg/dL - - -      2.  History of shingles.  Currently stable does not take Valtrex anymore at home on a scheduled basis.  3.  Borderline elevated D-dimer.  She has elevated D-dimer despite being on moderately high dose of Lovenox, lower extremity ultrasound negative, patient does have exposure to hormonal contraceptive for which I will place her on 1 month of prophylactic dose Eliquis upon discharge.  Her risk for developing a clot remains very high.  4.  Few bibasilar crackles on 04/30/2019.  Resolved after IV Lasix 2 doses.  She now has a productive cough will check a  chest x-ray.  5. Obesity.  BMI 37.  Follow with PCP.    Condition - Extremely Guarded  Family Communication  : Mother Deneise Lever [phone] 04/29/2019, 05/03/2019  Code Status : Full  Diet :   Diet Order            Diet regular Room service appropriate? Yes; Fluid consistency: Thin  Diet effective now                Disposition Plan  : Stay inpatient in the hospital for treatment of severe acute hypoxic respiratory failure due to COVID-19 pneumonia.  Finish treatment course, thereafter discharge home likely 05/06/19.  Consults  :  None  Procedures  :  None  PUD Prophylaxis : None  DVT Prophylaxis  :  Lovenox    Lab Results  Component Value Date   PLT 406 (H) 05/05/2019    Inpatient Medications  Scheduled Meds:  albuterol  2 puff Inhalation Q6H   enoxaparin (LOVENOX) injection  50 mg Subcutaneous Q12H   insulin aspart  0-15 Units Subcutaneous TID WC   insulin aspart  0-5 Units Subcutaneous QHS   insulin glargine  15 Units Subcutaneous Daily   mouth rinse  15 mL Mouth Rinse BID   multivitamin with minerals  1 tablet Oral Daily   Continuous Infusions:  PRN Meds:.acetaminophen, guaiFENesin-dextromethorphan, menthol-cetylpyridinium, valACYclovir  Antibiotics  :    Anti-infectives (From admission, onward)   Start     Dose/Rate Route Frequency Ordered Stop   04/30/19 1000  remdesivir 100 mg in sodium chloride 0.9 % 100 mL IVPB     100 mg 200 mL/hr over 30 Minutes Intravenous Daily 04/29/19 0018 05/03/19 0832   04/29/19 1034  valACYclovir (VALTREX) tablet 500 mg    Note to Pharmacy: Patient taking differently:  Take one tablet (500 mg) by mouth twice daily for one week - as needed for breakouts     500 mg Oral 2 times daily between meals and at bedtime PRN 04/29/19 0950     04/29/19 1000  remdesivir 100 mg in sodium chloride 0.9 % 100 mL IVPB  Status:  Discontinued     100 mg 200 mL/hr over 30 Minutes Intravenous Daily 04/28/19 1736 04/29/19 0018   04/29/19 0100  remdesivir 100 mg in sodium chloride 0.9 % 100 mL IVPB     100 mg 200 mL/hr over 30 Minutes Intravenous Every 30 min 04/29/19 0018 04/29/19 0700   04/28/19 1745  remdesivir 200 mg in sodium chloride 0.9% 250 mL IVPB  Status:  Discontinued     200 mg 580 mL/hr over 30 Minutes Intravenous Once 04/28/19 1736 04/29/19 0018        Time Spent in minutes  30   Lala Lund M.D on 05/05/2019 at 11:40 AM  To page go to [email or web address] - password [email or web address]  [facility] -  Office  [phone]    See all Orders from today for further details    Objective:   Vitals:   05/04/19 1240 05/04/19 1625 05/04/19 2126 05/05/19 0527  BP: 107/76 114/87 101/78 104/66  Pulse: 84 97 93 96  Resp: (!) 22 (!) 24 20 20   Temp: 98.2 F (36.8 C) 98 F (36.7 C) 98.6 F (37 C) 98.9 F (37.2 C)  TempSrc: Oral Oral Oral Oral  SpO2: 94% 94% 94% 95%  Weight:      Height:        Wt Readings from Last 3 Encounters:  05/02/19 108 kg  01/24/19 106.1 kg  04/29/11 96.6 kg     Intake/Output Summary (Last 24 hours) at 05/05/2019 1140 Last data filed at 05/05/2019 0900 Gross per 24 hour  Intake 240 ml  Output --  Net 240 ml     Physical Exam  Awake Alert, No new F.N deficits, Normal affect .AT,PERRAL Supple Neck,No JVD, No cervical lymphadenopathy appriciated.  Symmetrical Chest wall movement, Good air movement bilaterally, CTAB RRR,No Gallops, Rubs or new Murmurs, No Parasternal Heave +ve B.Sounds, Abd Soft, No tenderness, No organomegaly appriciated, No rebound - guarding or rigidity. No Cyanosis, Clubbing or edema, No new Rash or bruise    Data Review:    CBC Recent Labs  Lab 05/01/19 0700 05/02/19 0625 05/03/19 0344 05/04/19 0502 05/05/19 0719  WBC 10.6* 19.8* 18.2* 21.5* 24.0*  HGB 14.6 14.8 15.2* 15.5* 14.9  HCT 44.8 44.7 46.3* 47.4* 45.5  PLT 394 409* 435* 393 406*  MCV 91.4 91.2 90.4 92.4 90.1  MCH 29.8 30.2 29.7 30.2 29.5  MCHC 32.6 33.1 32.8 32.7 32.7  RDW 13.9 13.7 13.7 13.9 14.1  LYMPHSABS 1.1 0.8 0.9 1.2 1.8  MONOABS 1.1* 0.8 0.4 0.5 0.6  EOSABS 0.0 0.0 0.1 0.1 0.2  BASOSABS 0.0 0.1 0.1 0.1 0.1    Chemistries  Recent Labs  Lab 04/28/19 1620 04/28/19 1620 04/30/19 0347 04/30/19 0347 05/01/19 0700 05/02/19 0625 05/03/19 0344 05/04/19 0502 05/05/19 0719  NA  --   --  138   < >  137 138 140 139 139  K  --   --  5.1   < > 4.0 4.3 4.0 4.2 4.0  CL  --   --  105   < > 104 103 100 99 102  CO2  --   --  19*   < > 24 22  26 27 24   GLUCOSE  --   --  160*   < > 218* 211* 250* 230* 132*  BUN  --   --  15   < > 17 16 19 17 17   CREATININE  --   --  0.94   < > 1.02* 0.95 1.19* 1.14* 1.08*  CALCIUM  --   --  8.2*   < > 8.4* 8.6* 8.3* 8.4* 8.2*  AST 157*   < > 101*   < > 72* 54* 49* 44* 68*  ALT 103*   < > 72*   < > 79* 71* 69* 66* 79*  ALKPHOS 55   < > 52   < > 54 58 66 73 80  BILITOT 0.8   < > 1.2   < > 0.6 0.8 0.9 0.9 0.9  MG  --   --  2.6*  --  2.4 2.2 2.2  --   --   HGBA1C 6.2*  --   --   --   --   --   --   --   --    < > = values in this interval not displayed.     ------------------------------------------------------------------------------------------------------------------ No results for input(s): CHOL, HDL, LDLCALC, TRIG, CHOLHDL, LDLDIRECT in the last 72 hours.  Lab Results  Component Value Date   HGBA1C 6.2 (H) 04/28/2019   ------------------------------------------------------------------------------------------------------------------ No results for input(s): TSH, T4TOTAL, T3FREE, THYROIDAB in the last 72 hours.  Invalid input(s): FREET3  Cardiac Enzymes No results for input(s): CKMB, TROPONINI, MYOGLOBIN in the last 168 hours.  Invalid input(s): CK ------------------------------------------------------------------------------------------------------------------    Component Value Date/Time   BNP 45.8 05/03/2019 0344    Micro Results Recent Results (from the past 240 hour(s))  Blood Culture (routine x 2)     Status: None   Collection Time: 04/28/19  4:20 PM   Specimen: BLOOD  Result Value Ref Range Status   Specimen Description BLOOD LEFT ANTECUBITAL  Final   Special Requests   Final    BOTTLES DRAWN AEROBIC AND ANAEROBIC Blood Culture adequate volume   Culture   Final    NO GROWTH 5 DAYS Performed at [facility], [address] [place], [place] [address]    Report Status 05/03/2019 FINAL  Final  Blood Culture (routine x 2)     Status: None   Collection Time: 04/28/19  9:23 PM   Specimen: BLOOD  Result Value Ref Range Status   Specimen Description BLOOD LEFT ARM  Final   Special Requests   Final    BOTTLES DRAWN AEROBIC AND ANAEROBIC Blood Culture results may not be optimal due to an inadequate volume of blood received in culture bottles   Culture   Final    NO GROWTH 5 DAYS Performed at [facility], [place] [address], [place], [place] [address]    Report Status 05/03/2019 FINAL  Final    Radiology Reports DG Chest Port 1 View  Result Date: 05/03/2019 CLINICAL DATA:  Shortness of breath, COVID EXAM: PORTABLE CHEST 1 VIEW COMPARISON:  04/28/2019 FINDINGS: Cardiomegaly. There is interval increase in heterogeneous bilateral, bibasilar predominant airspace opacity. IMPRESSION: Interval increase in heterogeneous bilateral, bibasilar predominant airspace opacity, consistent with worsened multifocal infection and in keeping with COVID airspace disease. Electronically Signed   By: Eddie Candle M.D.   On: 05/03/2019 10:51   DG Chest Port 1 View  Result Date: 04/28/2019 CLINICAL DATA:  COVID-19 positive, short of breath, hypoxia EXAM: PORTABLE  CHEST 1 VIEW COMPARISON:  None. FINDINGS: Single frontal view of the chest demonstrates an unremarkable cardiac silhouette. Diffuse interstitial prominence with patchy bilateral ground-glass opacities greatest at the lung bases. No effusion or pneumothorax. No acute bony abnormalities. IMPRESSION: 1. Multifocal pneumonia compatible with COVID-19.  The Electronically Signed   By: Randa Ngo M.D.   On: 04/28/2019 16:47   VAS [place] LOWER EXTREMITY VENOUS (DVT)  Result Date: 05/03/2019  Lower Venous DVTStudy Indications: Edema, and Swelling.  Comparison Study: no prior Performing Technologist: Abram Sander RVS  Examination Guidelines: A complete evaluation includes B-mode imaging,  spectral Doppler, color Doppler, and power Doppler as needed of all accessible portions of each vessel. Bilateral testing is considered an integral part of a complete examination. Limited examinations for reoccurring indications may be performed as noted. The reflux portion of the exam is performed with the patient in reverse Trendelenburg.  +---------+---------------+---------+-----------+----------+--------------+  RIGHT     Compressibility Phasicity Spontaneity Properties Thrombus Aging  +---------+---------------+---------+-----------+----------+--------------+  CFV       Full            Yes       Yes                                    +---------+---------------+---------+-----------+----------+--------------+  SFJ       Full                                                             +---------+---------------+---------+-----------+----------+--------------+  FV Prox   Full                                                             +---------+---------------+---------+-----------+----------+--------------+  FV Mid    Full                                                             +---------+---------------+---------+-----------+----------+--------------+  FV Distal Full                                                             +---------+---------------+---------+-----------+----------+--------------+  PFV       Full                                                             +---------+---------------+---------+-----------+----------+--------------+  POP       Full            Yes  Yes                                    +---------+---------------+---------+-----------+----------+--------------+  PTV       Full                                                             +---------+---------------+---------+-----------+----------+--------------+  PERO                                                       Not visualized  +---------+---------------+---------+-----------+----------+--------------+    +---------+---------------+---------+-----------+----------+--------------+  LEFT      Compressibility Phasicity Spontaneity Properties Thrombus Aging  +---------+---------------+---------+-----------+----------+--------------+  CFV       Full            Yes       Yes                                    +---------+---------------+---------+-----------+----------+--------------+  SFJ       Full                                                             +---------+---------------+---------+-----------+----------+--------------+  FV Prox   Full                                                             +---------+---------------+---------+-----------+----------+--------------+  FV Mid    Full                                                             +---------+---------------+---------+-----------+----------+--------------+  FV Distal Full                                                             +---------+---------------+---------+-----------+----------+--------------+  PFV       Full                                                             +---------+---------------+---------+-----------+----------+--------------+  POP       Full  Yes       Yes                                    +---------+---------------+---------+-----------+----------+--------------+  PTV       Full                                                             +---------+---------------+---------+-----------+----------+--------------+  PERO                                                       Not visualized  +---------+---------------+---------+-----------+----------+--------------+     Summary: BILATERAL: - No evidence of deep vein thrombosis seen in the lower extremities, bilaterally.   *See table(s) above for measurements and observations. Electronically signed by Curt Jews MD on 05/03/2019 at 4:26:06 PM.    Final

## 2019-05-06 ENCOUNTER — Inpatient Hospital Stay (HOSPITAL_COMMUNITY): Payer: Managed Care, Other (non HMO)

## 2019-05-06 LAB — PROCALCITONIN: Procalcitonin: 0.24 ng/mL

## 2019-05-06 LAB — CBC WITH DIFFERENTIAL/PLATELET
Abs Immature Granulocytes: 0.91 10*3/uL — ABNORMAL HIGH (ref 0.00–0.07)
Basophils Absolute: 0.1 10*3/uL (ref 0.0–0.1)
Basophils Relative: 0 %
Eosinophils Absolute: 0.2 10*3/uL (ref 0.0–0.5)
Eosinophils Relative: 1 %
HCT: 46 % (ref 36.0–46.0)
Hemoglobin: 15.1 g/dL — ABNORMAL HIGH (ref 12.0–15.0)
Immature Granulocytes: 4 %
Lymphocytes Relative: 9 %
Lymphs Abs: 2 10*3/uL (ref 0.7–4.0)
MCH: 29.7 pg (ref 26.0–34.0)
MCHC: 32.8 g/dL (ref 30.0–36.0)
MCV: 90.4 fL (ref 80.0–100.0)
Monocytes Absolute: 0.8 10*3/uL (ref 0.1–1.0)
Monocytes Relative: 3 %
Neutro Abs: 19.5 10*3/uL — ABNORMAL HIGH (ref 1.7–7.7)
Neutrophils Relative %: 83 %
Platelets: 332 10*3/uL (ref 150–400)
RBC: 5.09 MIL/uL (ref 3.87–5.11)
RDW: 14 % (ref 11.5–15.5)
WBC: 23.4 10*3/uL — ABNORMAL HIGH (ref 4.0–10.5)
nRBC: 0.1 % (ref 0.0–0.2)

## 2019-05-06 LAB — EXPECTORATED SPUTUM ASSESSMENT W GRAM STAIN, RFLX TO RESP C: Special Requests: NORMAL

## 2019-05-06 LAB — BASIC METABOLIC PANEL
Anion gap: 11 (ref 5–15)
BUN: 14 mg/dL (ref 6–20)
CO2: 24 mmol/L (ref 22–32)
Calcium: 8.2 mg/dL — ABNORMAL LOW (ref 8.9–10.3)
Chloride: 104 mmol/L (ref 98–111)
Creatinine, Ser: 1.07 mg/dL — ABNORMAL HIGH (ref 0.44–1.00)
GFR calc Af Amer: >60 mL/min (ref >60)
GFR calc non Af Amer: >60 mL/min (ref >60)
Glucose, Bld: 121 mg/dL — ABNORMAL HIGH (ref 70–99)
Potassium: 3.9 mmol/L (ref 3.5–5.1)
Sodium: 139 mmol/L (ref 135–145)

## 2019-05-06 LAB — C-REACTIVE PROTEIN: CRP: <0.5 mg/dL (ref <1.0)

## 2019-05-06 LAB — D-DIMER, QUANTITATIVE: D-Dimer, Quant: 3.42 ug/mL-FEU — ABNORMAL HIGH (ref 0.00–0.50)

## 2019-05-06 LAB — GLUCOSE, CAPILLARY
Glucose-Capillary: 113 mg/dL — ABNORMAL HIGH (ref 70–99)
Glucose-Capillary: 147 mg/dL — ABNORMAL HIGH (ref 70–99)
Glucose-Capillary: 140 mg/dL — ABNORMAL HIGH (ref 70–99)
Glucose-Capillary: 123 mg/dL — ABNORMAL HIGH (ref 70–99)

## 2019-05-06 LAB — MRSA PCR SCREENING: MRSA by PCR: POSITIVE — AB

## 2019-05-06 MED ORDER — SODIUM CHLORIDE 0.9 % IV SOLN
3.0000 g | Freq: Three times a day (TID) | INTRAVENOUS | Status: DC
  Administered 2019-05-06 – 2019-05-07 (×4): 3 g via INTRAVENOUS
  Filled 2019-05-06 (×5): qty 8
  Filled 2019-05-06: qty 3

## 2019-05-06 MED ORDER — LACTATED RINGERS IV SOLN: INTRAVENOUS | Status: DC

## 2019-05-06 NOTE — Progress Notes (Signed)
Patient up from bed and in the chair for most of the day. Patient instructed to get up and have her meals siting up in the chair. Patient also educated on the  Use of Flutter valve and Inceptive spirometry.

## 2019-05-06 NOTE — Progress Notes (Addendum)
                                  PROGRESS NOTE                                                                                                                                                                                                             Patient Demographics:    Erica [name], is a 49 y.o. female, DOB - 12/20/1970, MRN:[id number]  Outpatient Primary MD for the patient is Mitchell, L.Dean, MD    LOS - 8  Admit date - 04/28/2019    Chief Complaint  Patient presents with  . COVID  . Shortness of Breath       Brief Narrative  Erica [name] is a 49 y.o. female with no significant past medical history presents the emergency department today with increasing short of breath.  Her symptoms started on 04/20/2019, initially was only feeling tired but no short of breath.  She had recently tested positive on 04/21/2019 for COVID-19 pneumonia and was placed on oral steroid treatment without much benefit, came to the ER with shortness of breath and was diagnosed with acute hypoxic respiratory failure due to COVID-19 pneumonia and admitted to the hospital with 6 L nasal cannula oxygen requirement.   Subjective:   Patient in bed, appears comfortable, denies any headache, no fever, no chest pain or pressure, mild productive cough but much improved shortness of breath , no abdominal pain. No focal weakness.   Assessment  & Plan :     1. Acute Hypoxic Resp. Failure due to Acute Covid 19 Viral Pneumonitis during the ongoing 2020 Covid 19 Pandemic - she has severe disease and was treated promptly after appropriate consents with IV Actemra on 04/29/2019 along with steroids and Remdesivir, she has shown slow but definite improvement, currently down to 1 L nasal cannula oxygen down from 15, will continue to titrate down oxygen and advance activity.  If stable most likely discharge home on 05/05/2019 on home oxygen which has been ordered.  Fortunately she has developed a small  pneumothorax and pneumomediastinum which will be monitored, also signs of possible aspiration pneumonia for which she will be placed on Unasyn, she does have a productive cough, MRSA nasal screen will be done, procalcitonin is stable, sputum Gram stain culture also added, encouraged to sit up in chair and have all meals in the chair multiple times.  We also discussed the CT results   with pulmonary on 05/07/2019.  Encouraged the patient to sit up in chair in the daytime use I-S and flutter valve for pulmonary toiletry and then prone in bed when at night.  Will advance activity and titrate down oxygen as possible.  SpO2: 90 % O2 Flow Rate (L/min): 1 L/min   Recent Labs  Lab 04/30/19 0347 04/30/19 0734 05/01/19 0700 05/01/19 0700 05/02/19 0625 05/02/19 1139 05/03/19 0344 05/04/19 0502 05/05/19 0719 05/06/19 0655  CRP 3.5*   < > 1.2*   < > 0.7  --  0.6 0.5 <0.5 <0.5  DDIMER  --    < > 1.28*   < > 1.62*  --  2.23* 2.47* 2.88* 3.42*  BNP 19.8  --  31.2  --  126.0*  --  45.8  --   --   --   PROCALCITON  --   --   --   --   --  <0.10 0.11 0.22 0.16 0.24   < > = values in this interval not displayed.    Hepatic Function Latest Ref Rng & Units 05/05/2019 05/04/2019 05/03/2019  Total Protein 6.5 - 8.1 g/dL 5.7(L) 5.9(L) 6.1(L)  Albumin 3.5 - 5.0 g/dL 2.8(L) 2.9(L) 2.8(L)  AST 15 - 41 U/L 68(H) 44(H) 49(H)  ALT 0 - 44 U/L 79(H) 66(H) 69(H)  Alk Phosphatase 38 - 126 U/L 80 73 66  Total Bilirubin 0.3 - 1.2 mg/dL 0.9 0.9 0.9  Bilirubin, Direct 0.0 - 0.2 mg/dL - - -      2.  History of shingles.  Currently stable does not take Valtrex anymore at home on a scheduled basis.  3.  Borderline elevated D-dimer.  She has elevated D-dimer despite being on moderately high dose of Lovenox, lower extremity ultrasound negative, patient does have exposure to hormonal contraceptive for which I will place her on 1 month of prophylactic dose Eliquis upon discharge.  Her risk for developing a clot remains very  high.  4.  Few bibasilar crackles on 04/30/2019.  Resolved after IV Lasix 2 doses.  She now has a productive cough will check a  chest x-ray.  5. Obesity.  BMI 37.  Follow with PCP.  6.  Superimposed aspiration bacterial pneumonia.  Unasyn, sputum Gram stain culture, BC 2 sets, TTE,  MRSA screen, currently afebrile with stable procalcitonin, all meals in chair, speech eval.    Condition - Extremely Guarded  Family Communication  : Mother Annie [phone] 04/29/2019, 05/03/2019  Code Status : Full  Diet :   Diet Order            Diet clear liquid Room service appropriate? Yes; Fluid consistency: Thin  Diet effective now               Disposition Plan  : Stay inpatient in the hospital for treatment of severe acute hypoxic respiratory failure due to COVID-19 pneumonia and bacterial aspiration pneumonia.  Finish treatment course, thereafter discharge home.  Consults  : CT will be discussed with pulmonary on 05/06/2019.  None  Procedures  :    TTE  Leg US - No DVT  CT - Extensive BILATERAL patchy airspace infiltrates consistent with multifocal pneumonia. Multiple cavitary foci are identified in both lungs, greater in the lower lobes; this is uncommon with COVID-19 alone, suggesting a superimposed cavitary pneumonia or sequela of aspiration pneumonia. Pneumomediastinum with gas extending into the cervical regions and less into the axilla as above; this is of uncertain etiology, could be related to barotrauma,   coughing, mechanical ventilation.    PUD Prophylaxis : None  DVT Prophylaxis  :  Lovenox    Lab Results  Component Value Date   PLT 332 05/06/2019    Inpatient Medications  Scheduled Meds: . albuterol  2 puff Inhalation Q6H  . enoxaparin (LOVENOX) injection  50 mg Subcutaneous Q12H  . insulin aspart  0-15 Units Subcutaneous TID WC  . insulin aspart  0-5 Units Subcutaneous QHS  . insulin glargine  15 Units Subcutaneous Daily  . mouth rinse  15 mL Mouth Rinse BID   . multivitamin with minerals  1 tablet Oral Daily   Continuous Infusions: . lactated ringers 75 mL/hr at 05/06/19 0735   PRN Meds:.acetaminophen, guaiFENesin-dextromethorphan, menthol-cetylpyridinium, valACYclovir  Antibiotics  :    Anti-infectives (From admission, onward)   Start     Dose/Rate Route Frequency Ordered Stop   04/30/19 1000  remdesivir 100 mg in sodium chloride 0.9 % 100 mL IVPB     100 mg 200 mL/hr over 30 Minutes Intravenous Daily 04/29/19 0018 05/03/19 0832   04/29/19 1034  valACYclovir (VALTREX) tablet 500 mg    Note to Pharmacy: Patient taking differently: Take one tablet (500 mg) by mouth twice daily for one week - as needed for breakouts     500 mg Oral 2 times daily between meals and at bedtime PRN 04/29/19 0950     04/29/19 1000  remdesivir 100 mg in sodium chloride 0.9 % 100 mL IVPB  Status:  Discontinued     100 mg 200 mL/hr over 30 Minutes Intravenous Daily 04/28/19 1736 04/29/19 0018   04/29/19 0100  remdesivir 100 mg in sodium chloride 0.9 % 100 mL IVPB     100 mg 200 mL/hr over 30 Minutes Intravenous Every 30 min 04/29/19 0018 04/29/19 0700   04/28/19 1745  remdesivir 200 mg in sodium chloride 0.9% 250 mL IVPB  Status:  Discontinued     200 mg 580 mL/hr over 30 Minutes Intravenous Once 04/28/19 1736 04/29/19 0018       Time Spent in minutes  30   Lala Lund M.D on 05/06/2019 at 10:17 AM  To page go to [email or web address] - password [email or web address]  [facility] -  Office  [phone]    See all Orders from today for further details    Objective:   Vitals:   05/05/19 2049 05/06/19 0000 05/06/19 0400 05/06/19 0701  BP: (!) 98/53 103/60 109/64   Pulse: 98 88 94   Resp: 20 (!) 22 18   Temp: (!) 100.6 F (38.1 C) 100 F (37.8 C) 99.7 F (37.6 C)   TempSrc: Oral Oral Oral   SpO2: 92% 90% 99% 90%  Weight:      Height:        Wt Readings from Last 3 Encounters:  05/02/19 108 kg  01/24/19 106.1 kg  04/29/11 96.6 kg     Intake/Output  Summary (Last 24 hours) at 05/06/2019 1017 Last data filed at 05/06/2019 0422 Gross per 24 hour  Intake 480 ml  Output 600 ml  Net -120 ml     Physical Exam  Awake Alert, No new F.N deficits, Normal affect [place].AT,PERRAL Supple Neck,No JVD, No cervical lymphadenopathy appriciated.  Symmetrical Chest wall movement, Good air movement bilaterally, CTAB RRR,No Gallops, Rubs or new Murmurs, No Parasternal Heave +ve B.Sounds, Abd Soft, No tenderness, No organomegaly appriciated, No rebound - guarding or rigidity. No Cyanosis, Clubbing or edema, No new Rash or bruise     Data Review:  CBC Recent Labs  Lab 05/02/19 0625 05/03/19 0344 05/04/19 0502 05/05/19 0719 05/06/19 0655  WBC 19.8* 18.2* 21.5* 24.0* 23.4*  HGB 14.8 15.2* 15.5* 14.9 15.1*  HCT 44.7 46.3* 47.4* 45.5 46.0  PLT 409* 435* 393 406* 332  MCV 91.2 90.4 92.4 90.1 90.4  MCH 30.2 29.7 30.2 29.5 29.7  MCHC 33.1 32.8 32.7 32.7 32.8  RDW 13.7 13.7 13.9 14.1 14.0  LYMPHSABS 0.8 0.9 1.2 1.8 2.0  MONOABS 0.8 0.4 0.5 0.6 0.8  EOSABS 0.0 0.1 0.1 0.2 0.2  BASOSABS 0.1 0.1 0.1 0.1 0.1    Chemistries  Recent Labs  Lab 04/30/19 0347 04/30/19 0347 05/01/19 0700 05/01/19 0700 05/02/19 0625 05/03/19 0344 05/04/19 0502 05/05/19 0719 05/06/19 0655  NA 138   < > 137   < > 138 140 139 139 139  K 5.1   < > 4.0   < > 4.3 4.0 4.2 4.0 3.9  CL 105   < > 104   < > 103 100 99 102 104  CO2 19*   < > 24   < > 22 26 27 24 24  GLUCOSE 160*   < > 218*   < > 211* 250* 230* 132* 121*  BUN 15   < > 17   < > 16 19 17 17 14  CREATININE 0.94   < > 1.02*   < > 0.95 1.19* 1.14* 1.08* 1.07*  CALCIUM 8.2*   < > 8.4*   < > 8.6* 8.3* 8.4* 8.2* 8.2*  AST 101*   < > 72*  --  54* 49* 44* 68*  --   ALT 72*   < > 79*  --  71* 69* 66* 79*  --   ALKPHOS 52   < > 54  --  58 66 73 80  --   BILITOT 1.2   < > 0.6  --  0.8 0.9 0.9 0.9  --   MG 2.6*  --  2.4  --  2.2 2.2  --   --   --    < > = values in this interval not displayed.      ------------------------------------------------------------------------------------------------------------------ No results for input(s): CHOL, HDL, LDLCALC, TRIG, CHOLHDL, LDLDIRECT in the last 72 hours.  Lab Results  Component Value Date   HGBA1C 6.2 (H) 04/28/2019   ------------------------------------------------------------------------------------------------------------------ No results for input(s): TSH, T4TOTAL, T3FREE, THYROIDAB in the last 72 hours.  Invalid input(s): FREET3  Cardiac Enzymes No results for input(s): CKMB, TROPONINI, MYOGLOBIN in the last 168 hours.  Invalid input(s): CK ------------------------------------------------------------------------------------------------------------------    Component Value Date/Time   BNP 45.8 05/03/2019 0344    Micro Results Recent Results (from the past 240 hour(s))  Blood Culture (routine x 2)     Status: None   Collection Time: 04/28/19  4:20 PM   Specimen: BLOOD  Result Value Ref Range Status   Specimen Description BLOOD LEFT ANTECUBITAL  Final   Special Requests   Final    BOTTLES DRAWN AEROBIC AND ANAEROBIC Blood Culture adequate volume   Culture   Final    NO GROWTH 5 DAYS Performed at [facility], [address] [place], [place] [address]    Report Status 05/03/2019 FINAL  Final  Blood Culture (routine x 2)     Status: None   Collection Time: 04/28/19  9:23 PM   Specimen: BLOOD  Result Value Ref Range Status   Specimen Description BLOOD LEFT ARM  Final   Special Requests     Final    BOTTLES DRAWN AEROBIC AND ANAEROBIC Blood Culture results may not be optimal due to an inadequate volume of blood received in culture bottles   Culture   Final    NO GROWTH 5 DAYS Performed at [facility], [address] [place], [place] [address]    Report Status 05/03/2019 FINAL  Final    Radiology Reports CT CHEST WO CONTRAST  Result Date: 05/06/2019 CLINICAL DATA:  Chest pain, shortness of breath,  pneumomediastinum, acute hypoxic respiratory failure due to COVID-19 viral pneumonia EXAM: CT CHEST WITHOUT CONTRAST TECHNIQUE: Multidetector CT imaging of the chest was performed following the standard protocol without IV contrast. Sagittal and coronal MPR images reconstructed from axial data set. COMPARISON:  None FINDINGS: Cardiovascular: Aorta normal caliber. Heart size normal. No pericardial effusion. Mediastinum/Nodes: Small hiatal hernia. Scattered pneumomediastinum few normal size mediastinal lymph nodes without thoracic adenopathy. Additional soft tissue gas is seen extending into the axilla and cervical regions bilaterally. Lungs/Pleura: Patchy airspace infiltrates throughout both lungs consistent with multifocal pneumonia and history of COVID-19. Multiple small cavitary foci are identified within the infiltrates in both lungs, greater in lower lobes. No pleural effusion or pneumothorax. No discrete pulmonary mass/nodule. Upper Abdomen: Visualized upper abdomen unremarkable Musculoskeletal: Osseous structures unremarkable. IMPRESSION: Extensive BILATERAL patchy airspace infiltrates consistent with multifocal pneumonia. Multiple cavitary foci are identified in both lungs, greater in the lower lobes; this is uncommon with COVID-19 alone, suggesting a superimposed cavitary pneumonia or sequela of aspiration pneumonia. Pneumomediastinum with gas extending into the cervical regions and less into the axilla as above; this is of uncertain etiology, could be related to barotrauma, coughing, mechanical ventilation. Electronically Signed   By: Mark  Boles M.D.   On: 05/06/2019 10:11   DG Chest Port 1 View  Result Date: 05/05/2019 CLINICAL DATA:  Increasing shortness of breath. EXAM: PORTABLE CHEST 1 VIEW COMPARISON:  Chest radiograph 05/03/2019 FINDINGS: There is pneumomediastinum and subcutaneous emphysema throughout the soft tissues of the visualized neck base. There are worsening bilateral diffuse airspace  opacities. There is more focal consolidation in the right peripheral lung. No significant pleural effusion. Query tiny right apical pneumothorax. IMPRESSION: 1. Pneumomediastinum and subcutaneous emphysema throughout the soft tissues of the visualized neck base. 2. Interval worsening of bilateral diffuse airspace opacities. 3. Query tiny right apical pneumothorax. Electronically Signed   By: Nancy  Ballantyne M.D.   On: 05/05/2019 16:31   DG Chest Port 1 View  Result Date: 05/03/2019 CLINICAL DATA:  Shortness of breath, COVID EXAM: PORTABLE CHEST 1 VIEW COMPARISON:  04/28/2019 FINDINGS: Cardiomegaly. There is interval increase in heterogeneous bilateral, bibasilar predominant airspace opacity. IMPRESSION: Interval increase in heterogeneous bilateral, bibasilar predominant airspace opacity, consistent with worsened multifocal infection and in keeping with COVID airspace disease. Electronically Signed   By: Alex  Bibbey M.D.   On: 05/03/2019 10:51   DG Chest Port 1 View  Result Date: 04/28/2019 CLINICAL DATA:  COVID-19 positive, short of breath, hypoxia EXAM: PORTABLE CHEST 1 VIEW COMPARISON:  None. FINDINGS: Single frontal view of the chest demonstrates an unremarkable cardiac silhouette. Diffuse interstitial prominence with patchy bilateral ground-glass opacities greatest at the lung bases. No effusion or pneumothorax. No acute bony abnormalities. IMPRESSION: 1. Multifocal pneumonia compatible with COVID-19.  The Electronically Signed   By: Michael  Brown M.D.   On: 04/28/2019 16:47   VAS US LOWER EXTREMITY VENOUS (DVT)  Result Date: 05/03/2019  Lower Venous DVTStudy Indications: Edema, and Swelling.  Comparison Study: no prior Performing Technologist: Megan   Riddle RVS  Examination Guidelines: A complete evaluation includes B-mode imaging, spectral Doppler, color Doppler, and power Doppler as needed of all accessible portions of each vessel. Bilateral testing is considered an integral part of a complete  examination. Limited examinations for reoccurring indications may be performed as noted. The reflux portion of the exam is performed with the patient in reverse Trendelenburg.  +---------+---------------+---------+-----------+----------+--------------+ RIGHT    CompressibilityPhasicitySpontaneityPropertiesThrombus Aging +---------+---------------+---------+-----------+----------+--------------+ CFV      Full           Yes      Yes                                 +---------+---------------+---------+-----------+----------+--------------+ SFJ      Full                                                        +---------+---------------+---------+-----------+----------+--------------+ FV Prox  Full                                                        +---------+---------------+---------+-----------+----------+--------------+ FV Mid   Full                                                        +---------+---------------+---------+-----------+----------+--------------+ FV DistalFull                                                        +---------+---------------+---------+-----------+----------+--------------+ PFV      Full                                                        +---------+---------------+---------+-----------+----------+--------------+ POP      Full           Yes      Yes                                 +---------+---------------+---------+-----------+----------+--------------+ PTV      Full                                                        +---------+---------------+---------+-----------+----------+--------------+ PERO                                                    Not visualized +---------+---------------+---------+-----------+----------+--------------+   +---------+---------------+---------+-----------+----------+--------------+ LEFT     CompressibilityPhasicitySpontaneityPropertiesThrombus Aging  +---------+---------------+---------+-----------+----------+--------------+ CFV      Full           Yes      Yes                                 +---------+---------------+---------+-----------+----------+--------------+ SFJ      Full                                                        +---------+---------------+---------+-----------+----------+--------------+ FV Prox  Full                                                        +---------+---------------+---------+-----------+----------+--------------+ FV Mid   Full                                                        +---------+---------------+---------+-----------+----------+--------------+ FV DistalFull                                                        +---------+---------------+---------+-----------+----------+--------------+ PFV      Full                                                        +---------+---------------+---------+-----------+----------+--------------+ POP      Full           Yes      Yes                                 +---------+---------------+---------+-----------+----------+--------------+ PTV      Full                                                        +---------+---------------+---------+-----------+----------+--------------+ PERO                                                  Not visualized +---------+---------------+---------+-----------+----------+--------------+     Summary: BILATERAL: - No evidence of deep vein thrombosis seen in the lower extremities, bilaterally.   *See table(s) above for measurements and observations. Electronically signed by Todd Early MD on 05/03/2019 at 4:26:06 PM.    Final      

## 2019-05-07 ENCOUNTER — Inpatient Hospital Stay (HOSPITAL_COMMUNITY): Payer: Managed Care, Other (non HMO)

## 2019-05-07 DIAGNOSIS — R7881 Bacteremia: Secondary | ICD-10-CM | POA: Diagnosis not present

## 2019-05-07 DIAGNOSIS — I5030 Unspecified diastolic (congestive) heart failure: Secondary | ICD-10-CM

## 2019-05-07 DIAGNOSIS — B9561 Methicillin susceptible Staphylococcus aureus infection as the cause of diseases classified elsewhere: Secondary | ICD-10-CM | POA: Diagnosis not present

## 2019-05-07 LAB — ECHOCARDIOGRAM LIMITED
Height: 65 in
Weight: 3809.55 oz

## 2019-05-07 LAB — COMPREHENSIVE METABOLIC PANEL
ALT: 51 U/L — ABNORMAL HIGH (ref 0–44)
AST: 38 U/L (ref 15–41)
Albumin: 2.5 g/dL — ABNORMAL LOW (ref 3.5–5.0)
Alkaline Phosphatase: 82 U/L (ref 38–126)
Anion gap: 10 (ref 5–15)
BUN: 9 mg/dL (ref 6–20)
CO2: 25 mmol/L (ref 22–32)
Calcium: 7.8 mg/dL — ABNORMAL LOW (ref 8.9–10.3)
Chloride: 106 mmol/L (ref 98–111)
Creatinine, Ser: 1.04 mg/dL — ABNORMAL HIGH (ref 0.44–1.00)
GFR calc Af Amer: >60 mL/min (ref >60)
GFR calc non Af Amer: >60 mL/min (ref >60)
Glucose, Bld: 109 mg/dL — ABNORMAL HIGH (ref 70–99)
Potassium: 3.8 mmol/L (ref 3.5–5.1)
Sodium: 141 mmol/L (ref 135–145)
Total Bilirubin: 1 mg/dL (ref 0.3–1.2)
Total Protein: 5 g/dL — ABNORMAL LOW (ref 6.5–8.1)

## 2019-05-07 LAB — BLOOD CULTURE ID PANEL (REFLEXED)

## 2019-05-07 LAB — GLUCOSE, CAPILLARY
Glucose-Capillary: 117 mg/dL — ABNORMAL HIGH (ref 70–99)
Glucose-Capillary: 133 mg/dL — ABNORMAL HIGH (ref 70–99)
Glucose-Capillary: 120 mg/dL — ABNORMAL HIGH (ref 70–99)
Glucose-Capillary: 123 mg/dL — ABNORMAL HIGH (ref 70–99)

## 2019-05-07 LAB — CBC WITH DIFFERENTIAL/PLATELET
Abs Immature Granulocytes: 0 10*3/uL (ref 0.00–0.07)
Basophils Absolute: 0 10*3/uL (ref 0.0–0.1)
Basophils Relative: 0 %
Eosinophils Absolute: 0.3 10*3/uL (ref 0.0–0.5)
Eosinophils Relative: 1 %
HCT: 42.3 % (ref 36.0–46.0)
Hemoglobin: 13.8 g/dL (ref 12.0–15.0)
Lymphocytes Relative: 11 %
Lymphs Abs: 2.8 10*3/uL (ref 0.7–4.0)
MCH: 30.1 pg (ref 26.0–34.0)
MCHC: 32.6 g/dL (ref 30.0–36.0)
MCV: 92.2 fL (ref 80.0–100.0)
Monocytes Absolute: 1 10*3/uL (ref 0.1–1.0)
Monocytes Relative: 4 %
Neutro Abs: 21.6 10*3/uL — ABNORMAL HIGH (ref 1.7–7.7)
Neutrophils Relative %: 84 %
Platelets: 264 10*3/uL (ref 150–400)
RBC: 4.59 MIL/uL (ref 3.87–5.11)
RDW: 14.2 % (ref 11.5–15.5)
WBC: 25.7 10*3/uL — ABNORMAL HIGH (ref 4.0–10.5)
nRBC: 0 /100 WBC
nRBC: 0.2 % (ref 0.0–0.2)

## 2019-05-07 LAB — C-REACTIVE PROTEIN: CRP: 0.5 mg/dL (ref <1.0)

## 2019-05-07 LAB — PROCALCITONIN: Procalcitonin: 0.5 ng/mL

## 2019-05-07 LAB — MAGNESIUM: Magnesium: 2.2 mg/dL (ref 1.7–2.4)

## 2019-05-07 LAB — D-DIMER, QUANTITATIVE: D-Dimer, Quant: 3.75 ug/mL-FEU — ABNORMAL HIGH (ref 0.00–0.50)

## 2019-05-07 MED ORDER — CEFAZOLIN SODIUM-DEXTROSE 2-4 GM/100ML-% IV SOLN
2.0000 g | Freq: Three times a day (TID) | INTRAVENOUS | Status: AC
  Administered 2019-05-07 – 2019-05-13 (×18): 2 g via INTRAVENOUS
  Filled 2019-05-07 (×20): qty 100

## 2019-05-07 NOTE — Progress Notes (Signed)
Physical Therapy Treatment Patient Details Name: Erica [name] MRN: [id number] DOB: [birth year] Today's Date: 05/07/2019    History of Present Illness 49 year old female admitted 04/28/19 with increasing SOB. Patient's symptoms began 04/20/19 with feeling tired and tested positive for COVID on 04/21/19. She started having cough, loss of taste, muscle aches, intermittent low-grade fevers. Oxygen saturation 81% at [facility] infusion clinic on 04/28/19. CXR: bilateral multifocal infiltrates. Patient admitted to the hospital with acute hypoxic respiratory failure due to COVID PNA requiring 6L [place]. IV Actemra given 04/29/19 as well as steroids and Remdesivir. Borderline elevated D-dimer. Started on Lovenox.  No significant PMH.    PT Comments    Patient progressing her mobility with skilled PT services in the hospital setting but still demonstrates decreased activity tolerance, impaired balance, and cues and assistance for line/tube mgmt to ensure safety. Patient is below her PLOF of independent with mobility. Recommend continued skilled PT services and discharge home with home PT.    2L HFNC at rest at start of session: O2 sat100% 3L HFNC for transfer to [facility]: O2 saturation down to 87%  3L HFNC for ambulation in room: O2 saturation down to 85% briefly during seated recovery but up to 90% in less than 30 seconds  Room air for ambulation in room: O2 saturation 87% but again patient recovered within 30 seconds seated rest to 90% or better  Room air at rest at end of session: O2 saturation 95%      Follow Up Recommendations  Home health PT;Supervision - Intermittent     Equipment Recommendations  None recommended by PT       Precautions / Restrictions Precautions Precautions: Fall;Other (comment) Precaution Comments: monitor oxygen saturation Restrictions Weight Bearing Restrictions: No    Mobility  Bed Mobility Overal bed mobility: Modified Independent Bed Mobility: Supine to Sit      Supine to sit: Modified independent (Device/Increase time);HOB elevated     General bed mobility comments: HOB approx 25 degrees  Transfers Overall transfer level: Needs assistance Equipment used: None Transfers: Sit to/from [employer] Transfers Sit to Stand: Supervision;Modified independent (Device/Increase time) Stand pivot transfers: Supervision       General transfer comment: stand-step transfer EOB>BSC, sit<>stand from chair x 2 trials  Ambulation/Gait Ambulation/Gait assistance: Supervision((close supervision)) Gait Distance (Feet): 40 Feet(x 2) Assistive device: None Gait Pattern/deviations: Step-through pattern(increased lateral sway) Gait velocity: decreased   General Gait Details: 3L HFNC gait trial 1, room air gait trial 2     Balance   Sitting-balance support: Feet supported Sitting balance-Leahy Scale: Good     Standing balance support: No upper extremity supported Standing balance-Leahy Scale: Good Standing balance comment: Patient able to complete pericaer standing after use of BSC with close supervision to ensure safety     Cognition Arousal/Alertness: Awake/alert Behavior During Therapy: WFL for tasks assessed/performed Overall Cognitive Status: No family/caregiver present to determine baseline cognitive functioning   General Comments: Patient at times requires clarification of explanation or instructions.    Exercises Other Exercises Other Exercises: flutter x 10 Other Exercises: incentive spirometer x 10, max 452mL     General Comments General comments (skin integrity, edema, etc.): Patient on 2L HFNC at rest at start of session satting 100%. Patient placed on 3L HFNC for transfer to [facility] and desatted to 87%. Ambulation on 3L HFNC with oxygen saturation down to 85% briefly during seated recovery but up to 90% in less than 30 seconds. Patient rated DOE for gait trial 1 as 4-5/10.  Gait trial 2 on room air with oxygen saturation down to 87% but  again patient recovered within 30 seconds seated rest to 90% or better. She rated DOE 5-6/10 for gait trial 2. Patient left on room air at rest at end of session with nurse's clearance, oxygen saturation 95% at rest on room air.       Pertinent Vitals/Pain Pain Assessment: No/denies pain           PT Goals (current goals can now be found in the care plan section) Progress towards PT goals: Progressing toward goals    Frequency    Min 3X/week      PT Plan Current plan remains appropriate       AM-PAC PT "6 Clicks" Mobility   Outcome Measure  Help needed turning from your back to your side while in a flat bed without using bedrails?: None Help needed moving from lying on your back to sitting on the side of a flat bed without using bedrails?: None Help needed moving to and from a bed to a chair (including a wheelchair)?: A Little Help needed standing up from a chair using your arms (e.g., wheelchair or bedside chair)?: None Help needed to walk in hospital room?: A Little Help needed climbing 3-5 steps with a railing? : A Little 6 Click Score: 21    End of Session Equipment Utilized During Treatment: Oxygen Activity Tolerance: Patient tolerated treatment well Patient left: in chair;with call bell/phone within reach Nurse Communication: Mobility status;Other (comment)(oxygen saturation response) PT Visit Diagnosis: Other abnormalities of gait and mobility (R26.89)     Time: [id number] PT Time Calculation (min) (ACUTE ONLY): 37 min  Charges:  $Gait Training: 23-37 mins                     Birdie Hopes, PT, DPT Acute Rehab [phone] office     Birdie Hopes 05/07/2019, 3:51 PM

## 2019-05-07 NOTE — Consult Note (Signed)
Erica [name] for Infectious Disease    Date of Admission:  04/28/2019     Total days of antibiotics 2               Reason for Consult: MSSA Bacteremia   Referring Provider: Tivis Ringer / [employer] Primary Care Provider: Alroy Dust, L.Marlou Sa, MD   ASSESSMENT:  Erica [name] is a 49 y/o female with acute respiratory failure secondary to COVID-19 pneumonia s/p treatment with Remdesivir, steroid, and tocilizumab with course complicated by development of MSSA bacteremia with likely source being subsequent bacterial pneumonia. Will discontinue Unasyn and continue Cefazolin given known organism. Recheck blood cultures. Echocardiogram ordered and is pending. No central lines present and does not appear to have any satellite sources of infection. Continue COVID management per primary team.   PLAN:  1. Discontinue Unasyn and continue Ancef 2. Recheck blood cultures 3. Await TTE results to rule out endocarditis. 4. Monitor repeat culture for clearance of bacteremia.  5. COVID management per primary team.    Principal Problem:   MSSA bacteremia Active Problems:   COVID-19 virus infection   . albuterol  2 puff Inhalation Q6H  . enoxaparin (LOVENOX) injection  50 mg Subcutaneous Q12H  . insulin aspart  0-15 Units Subcutaneous TID WC  . insulin aspart  0-5 Units Subcutaneous QHS  . insulin glargine  15 Units Subcutaneous Daily  . mouth rinse  15 mL Mouth Rinse BID  . multivitamin with minerals  1 tablet Oral Daily     HPI: Erica [name] is a 49 y.o. female with no significant previous medical history admitted to the hospital with about 1 week history of increasing shortness of breath. She tested positive for COVID on 4/17 with mild initial symptoms and intermittent low grade fevers. Was directed to infusion center at [facility] and was noted to be hypoxic.   Erica [name] was started on Remdesivir and steroids for acute respiratory failure secondary to COVID 19 pneumonia. Erica [name] course  was progressing slowly weaning down to 1 L nasal cannula with possibility of discharge. On 5/1/ developed an elevated temperature of 100.  Chest x-ray showed a small pneumothorax and pneumomediastinum with concerns for possible aspiration pneumonia. CT chest with extensive bilateral patchy airspace infiltrates consistent with multifocal pneumonia. Multiple cavitary foci were noted in both lungs inconsistent with COVID-19. Placed on Unasyn for aspiration pneumonia.  Blood cultures drawn on 5/2 with 1/4 bottle positive for gram positive cocci. MRSA PCR screen was positive. Respiratory specimen with gram positive cocci in pairs, gram negative rods, and gram positive rods.   Review of Systems: Review of Systems  Constitutional: Positive for malaise/fatigue. Negative for chills, fever and weight loss.  Respiratory: Positive for cough and sputum production. Negative for shortness of breath and wheezing.   Cardiovascular: Negative for chest pain and leg swelling.  Gastrointestinal: Negative for abdominal pain, constipation, diarrhea, nausea and vomiting.  Skin: Negative for rash.     Past Medical History:  Diagnosis Date  . BV (bacterial vaginosis) 2001  . COVID-19   . H/O gonorrhea 1995  . History of elevated lipids 03/18/2008  . HSV-2 infection 1995    Social History   Tobacco Use  . Smoking status: Never Smoker  . Smokeless tobacco: Never Used  Substance Use Topics  . Alcohol use: Yes    Comment: socially  . Drug use: No    Family History  Problem Relation Age of Onset  . Hypertension Maternal Grandmother   . Hypertension  Maternal Grandfather   . Asthma Cousin     No Known Allergies  OBJECTIVE: Blood pressure 112/61, pulse 84, temperature 98.8 F (37.1 C), temperature source Oral, resp. rate 18, height 5\' 5"  (1.651 m), weight 108 kg, last menstrual period 04/06/2019, SpO2 95 %.  Physical Exam Constitutional:      General: She is not in acute distress.    Appearance: She  is well-developed.     Comments: Seated in the chair; pleasant.   Cardiovascular:     Rate and Rhythm: Normal rate and regular rhythm.     Heart sounds: Normal heart sounds.  Pulmonary:     Breath sounds: Decreased breath sounds present.     Comments: Bibasilar breath sounds.  Skin:    General: Skin is warm and dry.  Neurological:     Mental Status: She is alert and oriented to person, place, and time.  Psychiatric:        Behavior: Behavior normal.        Thought Content: Thought content normal.        Judgment: Judgment normal.     Lab Results Lab Results  Component Value Date   WBC 25.7 (H) 05/07/2019   HGB 13.8 05/07/2019   HCT 42.3 05/07/2019   MCV 92.2 05/07/2019   PLT 264 05/07/2019    Lab Results  Component Value Date   CREATININE 1.04 (H) 05/07/2019   BUN 9 05/07/2019   NA 141 05/07/2019   K 3.8 05/07/2019   CL 106 05/07/2019   CO2 25 05/07/2019    Lab Results  Component Value Date   ALT 51 (H) 05/07/2019   AST 38 05/07/2019   ALKPHOS 82 05/07/2019   BILITOT 1.0 05/07/2019     Microbiology: Recent Results (from the past 240 hour(s))  Blood Culture (routine x 2)     Status: None   Collection Time: 04/28/19  4:20 PM   Specimen: BLOOD  Result Value Ref Range Status   Specimen Description BLOOD LEFT ANTECUBITAL  Final   Special Requests   Final    BOTTLES DRAWN AEROBIC AND ANAEROBIC Blood Culture adequate volume   Culture   Final    NO GROWTH 5 DAYS Performed at [facility], [address] [place], [place] [address]    Report Status 05/03/2019 FINAL  Final  Blood Culture (routine x 2)     Status: None   Collection Time: 04/28/19  9:23 PM   Specimen: BLOOD  Result Value Ref Range Status   Specimen Description BLOOD LEFT ARM  Final   Special Requests   Final    BOTTLES DRAWN AEROBIC AND ANAEROBIC Blood Culture results may not be optimal due to an inadequate volume of blood received in culture bottles   Culture   Final    NO GROWTH 5  DAYS Performed at [facility], [place] [address], [place], [place] [address]    Report Status 05/03/2019 FINAL  Final  Culture, blood (routine x 2)     Status: None (Preliminary result)   Collection Time: 05/06/19 10:44 AM   Specimen: BLOOD  Result Value Ref Range Status   Specimen Description BLOOD SITE NOT SPECIFIED  Final   Special Requests   Final    BOTTLES DRAWN AEROBIC ONLY Blood Culture results may not be optimal due to an inadequate volume of blood received in culture bottles   Culture   Final    NO GROWTH < 24 HOURS Performed at [facility], [place] [address] [place], [place] [address]    Report Status PENDING  Incomplete  Culture, blood (routine x 2)     Status: None (Preliminary result)   Collection Time: 05/06/19 10:47 AM   Specimen: BLOOD RIGHT ARM  Result Value Ref Range Status   Specimen Description BLOOD RIGHT ARM  Final   Special Requests   Final    BOTTLES DRAWN AEROBIC AND ANAEROBIC Blood Culture adequate volume   Culture  Setup Time   Final    GRAM POSITIVE COCCI IN CLUSTERS ANAEROBIC BOTTLE ONLY Organism ID to follow CRITICAL RESULT CALLED TO, READ BACK BY AND VERIFIED WITH: TJaneice Robinson PHARMD, AT 1059 05/07/19 BY D. VANHOOK Performed at [facility], [place] [address] [place], [place] [address]    Culture GRAM POSITIVE COCCI  Final   Report Status PENDING  Incomplete  Blood Culture ID Panel (Reflexed)     Status: Abnormal   Collection Time: 05/06/19 10:47 AM  Result Value Ref Range Status   Enterococcus species NOT DETECTED NOT DETECTED Final   Listeria monocytogenes NOT DETECTED NOT DETECTED Final   Staphylococcus species DETECTED (A) NOT DETECTED Final    Comment: CRITICAL RESULT CALLED TO, READ BACK BY AND VERIFIED WITH: T. BAUMEISTER PHARMD, AT 1059 05/07/19 BY D. VANHOOK    Staphylococcus aureus (BCID) DETECTED (A) NOT DETECTED Final    Comment: Methicillin (oxacillin) susceptible Staphylococcus aureus (MSSA). Preferred therapy is anti  staphylococcal beta lactam antibiotic (Cefazolin or Nafcillin), unless clinically contraindicated. CRITICAL RESULT CALLED TO, READ BACK BY AND VERIFIED WITH: T. BAUMEISTER PHARMD, AT 1059 05/07/19 BY D. VANHOOK    Methicillin resistance NOT DETECTED NOT DETECTED Final   Streptococcus species NOT DETECTED NOT DETECTED Final   Streptococcus agalactiae NOT DETECTED NOT DETECTED Final   Streptococcus pneumoniae NOT DETECTED NOT DETECTED Final   Streptococcus pyogenes NOT DETECTED NOT DETECTED Final   Acinetobacter baumannii NOT DETECTED NOT DETECTED Final   Enterobacteriaceae species NOT DETECTED NOT DETECTED Final   Enterobacter cloacae complex NOT DETECTED NOT DETECTED Final   Escherichia coli NOT DETECTED NOT DETECTED Final   Klebsiella oxytoca NOT DETECTED NOT DETECTED Final   Klebsiella pneumoniae NOT DETECTED NOT DETECTED Final   Proteus species NOT DETECTED NOT DETECTED Final   Serratia marcescens NOT DETECTED NOT DETECTED Final   Haemophilus influenzae NOT DETECTED NOT DETECTED Final   Neisseria meningitidis NOT DETECTED NOT DETECTED Final   Pseudomonas aeruginosa NOT DETECTED NOT DETECTED Final   Candida albicans NOT DETECTED NOT DETECTED Final   Candida glabrata NOT DETECTED NOT DETECTED Final   Candida krusei NOT DETECTED NOT DETECTED Final   Candida parapsilosis NOT DETECTED NOT DETECTED Final   Candida tropicalis NOT DETECTED NOT DETECTED Final    Comment: Performed at [facility], [place]. [address] [place], [place] [address]  MRSA PCR Screening     Status: Abnormal   Collection Time: 05/06/19 10:59 AM   Specimen: Nasal Mucosa; Nasopharyngeal  Result Value Ref Range Status   MRSA by PCR POSITIVE (A) NEGATIVE Final    Comment:        The GeneXpert MRSA Assay (FDA approved for NASAL specimens only), is one component of a comprehensive MRSA colonization surveillance program. It is not intended to diagnose MRSA infection nor to guide or monitor treatment for MRSA  infections. CRITICAL RESULT CALLED TO, READ BACK BY AND VERIFIED WITH: RN Salley Slaughter [id number] AT 1414 BY CM Performed at [facility], [address] [place], [place] [address]   Expectorated sputum assessment w rflx to resp cult     Status: None   Collection Time: 05/06/19  1:00 PM   Specimen: Expectorated Sputum  Result Value Ref Range Status   Specimen Description Expect. Sput  Final   Special Requests Normal  Final   Sputum evaluation   Final    THIS SPECIMEN IS ACCEPTABLE FOR SPUTUM CULTURE Performed at [facility], [place] [address] [place], [place] [address]    Report Status 05/06/2019 FINAL  Final  Culture, respiratory     Status: None (Preliminary result)   Collection Time: 05/06/19  1:00 PM  Result Value Ref Range Status   Specimen Description Expect. Sput  Final   Special Requests Normal Reflexed from [id number]  Final   Gram Stain   Final    RARE WBC PRESENT, PREDOMINANTLY PMN FEW GRAM POSITIVE COCCI IN PAIRS FEW GRAM NEGATIVE RODS FEW GRAM POSITIVE RODS    Culture   Final    CULTURE REINCUBATED FOR BETTER GROWTH Performed at [facility], [address] [place],  [address]    Report Status PENDING  Incomplete     Terri Piedra, [place] [facility] [place] [facility]  05/07/2019  2:48 PM

## 2019-05-07 NOTE — Progress Notes (Signed)
Pharmacy Antibiotic Note  Erica [name] is a 49 y.o. female admitted on 04/28/2019 with MSSA bacteremia.  Pharmacy has been consulted for cefazolin dosing.  Plan: Cefazolin 2gm IV q8h F/u ID recommendations in regard to unasyn therapy Monitor for clinical course, LOT, and deescalation  Height: 5\' 5"  (165.1 cm) Weight: 108 kg (238 lb 1.6 oz) IBW/kg (Calculated) : 57  Temp (24hrs), Avg:99 F (37.2 C), Min:98.6 F (37 C), Max:99.7 F (37.6 C)  Recent Labs  Lab 05/03/19 0344 05/04/19 0502 05/05/19 0719 05/06/19 0655 05/07/19 0517  WBC 18.2* 21.5* 24.0* 23.4* 25.7*  CREATININE 1.19* 1.14* 1.08* 1.07* 1.04*    Estimated Creatinine Clearance: 78.2 mL/min (A) (by C-G formula based on SCr of 1.04 mg/dL (H)).    No Known Allergies  Antimicrobials this admission: 5/2 Unasyn >>  5/3 Cefazolin >>    Microbiology results: 5/2 BCx: MSSA   Samari Bittinger A. Levada Dy, PharmD, BCPS, FNKF Clinical Pharmacist [place] Please utilize Amion for appropriate phone number to reach the unit pharmacist ([place])   05/07/2019 12:41 PM

## 2019-05-07 NOTE — Progress Notes (Signed)
  Echocardiogram 2D Echocardiogram has been performed.  Erica [name] 05/07/2019, 3:00 PM

## 2019-05-07 NOTE — Evaluation (Signed)
Clinical/Bedside Swallow Evaluation Patient Details  Name: Erica [name] MRN: [id number] Date of Birth: 1970/07/18  Today's Date: 05/07/2019 Time: SLP Start Time (ACUTE ONLY): 20 SLP Stop Time (ACUTE ONLY): 1116 SLP Time Calculation (min) (ACUTE ONLY): 13 min  Past Medical History:  Past Medical History:  Diagnosis Date  . BV (bacterial vaginosis) 2001  . COVID-19   . H/O gonorrhea 1995  . History of elevated lipids 03/18/2008  . HSV-2 infection 1995   Past Surgical History: History reviewed. No pertinent surgical history. HPI:  49 year old female admitted 04/28/19 with increasing SOB. Patient's symptoms began 04/20/19 with feeling tired and tested positive for COVID on 04/21/19. She started having cough, loss of taste, muscle aches, intermittent low-grade fevers. Oxygen saturation 81% at [facility] infusion clinic on 04/28/19. CXR: bilateral multifocal infiltrates. Patient admitted to the hospital with acute hypoxic respiratory failure due to COVID PNA requiring 6L [place]. No significant PMH.    Assessment / Plan / Recommendation Clinical Impression  Pt was seen for a bedside swallow evaluation and she presents with suspected functional oropharyngeal swallowing abilities.  Pt was encountered awake/alert and she was noted to have hoarse vocal quality upon SLP arrival.  Pt reported that the vocal change occurred in the past few days as she has begun to expectorate sputum.  She was also observed to have mild tachypnea throughout this evaluation.  She denied coughing/choking with PO intake and stated that she consumes regular foods and drinks without difficulty at baseline.  Pt is currently on a clear liquid (thin) diet.  Spoke with RN prior to this evaluation and she stated that it was okay for pt to consume solids at this time.  Oral mechanism exam was unremarkable.  Pt consumed thin liquid, puree, and regular solids.  Mastication of regular solids was timely and AP transport/swallow initiation  appeared timely with all trials.  No overt s/sx of aspiration were observed with any trials despite challenging.  Recommend regular solids and thin liquids with medication administered whole with liquid or in puree (per pt preference).  Pt was educated regarding compensatory strategies (listed below) and she verbalized understanding.  Due to recent Chest CT that was concerning for aspiration PNA, SLP will briefly f/u to monitor diet tolerance.    SLP Visit Diagnosis: Dysphagia, unspecified (R13.10)    Aspiration Risk  Mild aspiration risk    Diet Recommendation Regular;Thin liquid   Liquid Administration via: Cup;Straw Medication Administration: Whole meds with liquid Supervision: Patient able to self feed Compensations: Slow rate;Small sips/bites(Take a break if SOB ) Postural Changes: Seated upright at 90 degrees    Other  Recommendations Oral Care Recommendations: Oral care BID   Follow up Recommendations None      Frequency and Duration min 1 x/week  2 weeks       Prognosis Prognosis for Safe Diet Advancement: Good      Swallow Study   General HPI: 49 year old female admitted 04/28/19 with increasing SOB. Patient's symptoms began 04/20/19 with feeling tired and tested positive for COVID on 04/21/19. She started having cough, loss of taste, muscle aches, intermittent low-grade fevers. Oxygen saturation 81% at [facility] infusion clinic on 04/28/19. CXR: bilateral multifocal infiltrates. Patient admitted to the hospital with acute hypoxic respiratory failure due to COVID PNA requiring 6L [place]. No significant PMH.  Type of Study: Bedside Swallow Evaluation Previous Swallow Assessment: None  Diet Prior to this Study: Thin liquids Temperature Spikes Noted: Yes Respiratory Status: Nasal cannula History  of Recent Intubation: No Behavior/Cognition: Alert;Cooperative;Pleasant mood Oral Cavity Assessment: Within Functional Limits Oral Care Completed by SLP: No Oral Cavity - Dentition:  Adequate natural dentition Vision: Functional for self-feeding Self-Feeding Abilities: Able to feed self Patient Positioning: Upright in chair Baseline Vocal Quality: Hoarse Volitional Swallow: Able to elicit    Oral/Motor/Sensory Function Overall Oral Motor/Sensory Function: Within functional limits   Ice Chips Ice chips: Not tested   Thin Liquid Thin Liquid: Within functional limits    Nectar Thick Nectar Thick Liquid: Not tested   Honey Thick Honey Thick Liquid: Not tested   Puree Puree: Within functional limits   Solid     Solid: Within functional limits Presentation: [place]., M.S., [place] Office: [phone]  [place] 05/07/2019,11:29 AM

## 2019-05-07 NOTE — Progress Notes (Signed)
PHARMACY - PHYSICIAN COMMUNICATION CRITICAL VALUE ALERT - BLOOD CULTURE IDENTIFICATION (BCID)  Erica [name] is an 49 y.o. female who presented to [facility] on 04/28/2019 with a chief complaint of shortness of breath secondary to COVID-19. She developed a productive cough and was started on Unasyn for possible PNA.  Assessment:  1/3 bottles positive for MSSA  Name of physician (or Provider) Contacted: Dr. Candiss Norse, ID will be auto consulted  Current antibiotics: Unasyn  Changes to prescribed antibiotics recommended:  Recommendations accepted by provider  ID to see continue current therapy for now  Results for orders placed or performed during the hospital encounter of 04/28/19  Blood Culture ID Panel (Reflexed) (Collected: 05/06/2019 10:47 AM)  Result Value Ref Range   Enterococcus species NOT DETECTED NOT DETECTED   Listeria monocytogenes NOT DETECTED NOT DETECTED   Staphylococcus species DETECTED (A) NOT DETECTED   Staphylococcus aureus (BCID) DETECTED (A) NOT DETECTED   Methicillin resistance NOT DETECTED NOT DETECTED   Streptococcus species NOT DETECTED NOT DETECTED   Streptococcus agalactiae NOT DETECTED NOT DETECTED   Streptococcus pneumoniae NOT DETECTED NOT DETECTED   Streptococcus pyogenes NOT DETECTED NOT DETECTED   Acinetobacter baumannii NOT DETECTED NOT DETECTED   Enterobacteriaceae species NOT DETECTED NOT DETECTED   Enterobacter cloacae complex NOT DETECTED NOT DETECTED   Escherichia coli NOT DETECTED NOT DETECTED   Klebsiella oxytoca NOT DETECTED NOT DETECTED   Klebsiella pneumoniae NOT DETECTED NOT DETECTED   Proteus species NOT DETECTED NOT DETECTED   Serratia marcescens NOT DETECTED NOT DETECTED   Haemophilus influenzae NOT DETECTED NOT DETECTED   Neisseria meningitidis NOT DETECTED NOT DETECTED   Pseudomonas aeruginosa NOT DETECTED NOT DETECTED   Candida albicans NOT DETECTED NOT DETECTED   Candida glabrata NOT DETECTED NOT DETECTED   Candida krusei NOT DETECTED  NOT DETECTED   Candida parapsilosis NOT DETECTED NOT DETECTED   Candida tropicalis NOT DETECTED NOT DETECTED    Phillis Haggis 05/07/2019  11:03 AM

## 2019-05-07 NOTE — Progress Notes (Signed)
Pt slept for most of the night on 2L nasal cannula.  She continues to have a strong, productive cough with thick tan sputum. No acute events overnight.

## 2019-05-07 NOTE — Progress Notes (Signed)
{  PT ASATURATION QUALIFICATIONS: (This note is used to comply with regulatory documentation for home oxygen)  Patient Saturations on Room Air at Rest = 94%  Patient Saturations on Room Air while Ambulating = 87%  Patient Saturations on 3 Liters of oxygen while Ambulating = 85% (but not sustained, recovers within 30 seconds seated rest to 90% or better)  Please briefly explain why patient needs home oxygen: Patient still desaturates with mobility. She rates DOE as 5-6/10 when ambulating on room air.  Birdie Hopes, PT, DPT Acute Rehab [phone] office

## 2019-05-07 NOTE — TOC Progression Note (Addendum)
Transition of Care [facility]) - Progression Note    Patient Details  Name: Erica [name] MRN: [id number] Date of Birth: 10-20-1970  [facility]) CM/SW Contact  Maryclare Labrador, RN Phone Number: 05/07/2019, 4:18 PM  Clinical Narrative:   CM acknowledges home oxygen order.  CM asked bedside nurse to perform/document ambulatory oxygen test in order for home oxygen to be arranged.  Ambulatory note entered into epic that qualifies pts for home oxygen.  CM offered agency choice - pt chose [employer] - agency accepts oxygen referral    Expected Discharge Plan: [place] Barriers to Discharge: Continued Medical Work up  Expected Discharge Plan and Services Expected Discharge Plan: [place]   Discharge Planning Services: CM Consult Post Acute Care Choice: Durable Medical Equipment                   DME Arranged: Oxygen DME Agency: [employer] Date DME Agency Contacted: 05/07/19 Time DME Agency Contacted: [phone] Representative spoke with at DME Agency: [place]: PT [place]: [place] Date [place]: 05/04/19   Representative spoke with at [place]: [place] ([place]) Interventions    Readmission Risk Interventions No flowsheet data found.

## 2019-05-07 NOTE — Plan of Care (Signed)

## 2019-05-07 NOTE — Progress Notes (Signed)
PROGRESS NOTE                                                                                                                                                                                                             Patient Demographics:    Erica [name], is a 49 y.o. female, DOB - 13-Aug-1970, FXJ:[id number]  Outpatient Primary MD for the patient is Alroy Dust, L.Marlou Sa, MD    LOS - 9  Admit date - 04/28/2019    Chief Complaint  Patient presents with  . COVID  . Shortness of Breath       Brief Narrative  Erica [name] is a 49 y.o. female with no significant past medical history presents the emergency department today with increasing short of breath.  Her symptoms started on 04/20/2019, initially was only feeling tired but no short of breath.  She had recently tested positive on 04/21/2019 for COVID-19 pneumonia and was placed on oral steroid treatment without much benefit, came to the ER with shortness of breath and was diagnosed with acute hypoxic respiratory failure due to COVID-19 pneumonia and admitted to the hospital with 6 L nasal cannula oxygen requirement.   Subjective:   Patient in bed, appears comfortable, denies any headache, no fever, no chest pain or pressure, positive productive cough but much improved shortness of breath , no abdominal pain. No focal weakness..   Assessment  & Plan :     1. Acute Hypoxic Resp. Failure due to Acute Covid 19 Viral Pneumonitis during the ongoing 2020 Covid 19 Pandemic - she has severe disease and was treated promptly after appropriate consents with IV Actemra on 04/29/2019 along with steroids and Remdesivir, she has shown slow but definite improvement, currently down to 1 L nasal cannula oxygen down from 15, will continue to titrate down oxygen and advance activity.  If stable most likely discharge home on 05/05/2019 on home oxygen which has been ordered.  Fortunately she has developed a  small pneumothorax and pneumomediastinum which will be monitored, also signs of possible aspiration pneumonia for which she will be placed on Unasyn, she does have a productive cough, MRSA nasal screen will be done, procalcitonin is stable, sputum Gram stain culture also added, encouraged to sit up in chair and have all meals in the chair multiple times.  We also discussed the CT results  with pulmonary on 05/07/2019.  Encouraged the patient to sit up in chair in the daytime use I-S and flutter valve for pulmonary toiletry and then prone in bed when at night.  Will advance activity and titrate down oxygen as possible.  SpO2: 95 % O2 Flow Rate (L/min): 2 L/min   Recent Labs  Lab 05/01/19 0700 05/01/19 0700 05/02/19 0625 05/02/19 1139 05/03/19 0344 05/04/19 0502 05/05/19 0719 05/06/19 0655 05/07/19 0517  CRP 1.2*   < > 0.7  --  0.6 0.5 <0.5 <0.5 0.5  DDIMER 1.28*   < > 1.62*  --  2.23* 2.47* 2.88* 3.42* 3.75*  BNP 31.2  --  126.0*  --  45.8  --   --   --   --   PROCALCITON  --   --   --    < > 0.11 0.22 0.16 0.24 0.50   < > = values in this interval not displayed.    Hepatic Function Latest Ref Rng & Units 05/07/2019 05/05/2019 05/04/2019  Total Protein 6.5 - 8.1 g/dL 5.0(L) 5.7(L) 5.9(L)  Albumin 3.5 - 5.0 g/dL 2.5(L) 2.8(L) 2.9(L)  AST 15 - 41 U/L 38 68(H) 44(H)  ALT 0 - 44 U/L 51(H) 79(H) 66(H)  Alk Phosphatase 38 - 126 U/L 82 80 73  Total Bilirubin 0.3 - 1.2 mg/dL 1.0 0.9 0.9  Bilirubin, Direct 0.0 - 0.2 mg/dL - - -      2.  History of shingles.  Currently stable does not take Valtrex anymore at home on a scheduled basis.  3.  Borderline elevated D-dimer.  She has elevated D-dimer despite being on moderately high dose of Lovenox, lower extremity ultrasound negative, patient does have exposure to hormonal contraceptive for which I will place her on 1 month of prophylactic dose Eliquis upon discharge.  Her risk for developing a clot remains very high.  4.  Few bibasilar crackles on  04/30/2019.  Resolved after IV Lasix 2 doses.  She now has a productive cough will check a  chest x-ray.  5. Obesity.  BMI 37.  Follow with PCP.  6.  Superimposed aspiration bacterial pneumonia.  Patient strictly counseled multiple times to have all meals in the chair, she tends to eat in bed laying flat, cleared by speech, 1/4 bottles growing MSSA, currently on Unasyn will place on cefazolin for now as well, echo is pending, procalcitonin is stable, CT was discussed with pulmonary, ID to see as well.  Note patient also received Actemra for her severe COVID-19 infection which does predispose her to bacterial infections.    Condition - Extremely Guarded  Family Communication  : Mother Deneise Lever [phone] 04/29/2019, 05/03/2019  Code Status : Full  Diet :   Diet Order            Diet regular Room service appropriate? Yes with Assist; Fluid consistency: Thin  Diet effective now               Disposition Plan  : Stay inpatient in the hospital for treatment of severe acute hypoxic respiratory failure due to COVID-19 pneumonia and bacterial aspiration pneumonia.  Finish treatment course, thereafter discharge home.  Consults  : CT  discussed with pulmonary on 05/06/2019. ID  Procedures  :    TTE -   Leg [place] - No DVT  CT - Extensive BILATERAL patchy airspace infiltrates consistent with multifocal pneumonia. Multiple cavitary foci are identified in both lungs, greater in the lower lobes; this is uncommon with COVID-19 alone,  suggesting a superimposed cavitary pneumonia or sequela of aspiration pneumonia. Pneumomediastinum with gas extending into the cervical regions and less into the axilla as above; this is of uncertain etiology, could be related to barotrauma, coughing, mechanical ventilation.    PUD Prophylaxis : None  DVT Prophylaxis  :  Lovenox    Lab Results  Component Value Date   PLT 264 05/07/2019    Inpatient Medications  Scheduled Meds: . albuterol  2 puff Inhalation Q6H   . enoxaparin (LOVENOX) injection  50 mg Subcutaneous Q12H  . insulin aspart  0-15 Units Subcutaneous TID WC  . insulin aspart  0-5 Units Subcutaneous QHS  . insulin glargine  15 Units Subcutaneous Daily  . mouth rinse  15 mL Mouth Rinse BID  . multivitamin with minerals  1 tablet Oral Daily   Continuous Infusions: . ampicillin-sulbactam (UNASYN) IV 3 g (05/07/19 1117)   PRN Meds:.acetaminophen, guaiFENesin-dextromethorphan, menthol-cetylpyridinium, valACYclovir  Antibiotics  :    Anti-infectives (From admission, onward)   Start     Dose/Rate Route Frequency Ordered Stop   05/06/19 1200  Ampicillin-Sulbactam (UNASYN) 3 g in sodium chloride 0.9 % 100 mL IVPB     3 g 200 mL/hr over 30 Minutes Intravenous Every 8 hours 05/06/19 1029     04/30/19 1000  remdesivir 100 mg in sodium chloride 0.9 % 100 mL IVPB     100 mg 200 mL/hr over 30 Minutes Intravenous Daily 04/29/19 0018 05/03/19 0832   04/29/19 1034  valACYclovir (VALTREX) tablet 500 mg    Note to Pharmacy: Patient taking differently: Take one tablet (500 mg) by mouth twice daily for one week - as needed for breakouts     500 mg Oral 2 times daily between meals and at bedtime PRN 04/29/19 0950     04/29/19 1000  remdesivir 100 mg in sodium chloride 0.9 % 100 mL IVPB  Status:  Discontinued     100 mg 200 mL/hr over 30 Minutes Intravenous Daily 04/28/19 1736 04/29/19 0018   04/29/19 0100  remdesivir 100 mg in sodium chloride 0.9 % 100 mL IVPB     100 mg 200 mL/hr over 30 Minutes Intravenous Every 30 min 04/29/19 0018 04/29/19 0700   04/28/19 1745  remdesivir 200 mg in sodium chloride 0.9% 250 mL IVPB  Status:  Discontinued     200 mg 580 mL/hr over 30 Minutes Intravenous Once 04/28/19 1736 04/29/19 0018       Time Spent in minutes  30   Lala Lund M.D on 05/07/2019 at 12:15 PM  To page go to [email or web address] - password [facility]  [facility] -  Office  [phone]    See all Orders from today for further details     Objective:   Vitals:   05/07/19 0952 05/07/19 0957 05/07/19 1100 05/07/19 1114  BP:      Pulse:      Resp:      Temp:      TempSrc:      SpO2: (!) 86% 92% 100% 95%  Weight:      Height:        Wt Readings from Last 3 Encounters:  05/02/19 108 kg  01/24/19 106.1 kg  04/29/11 96.6 kg     Intake/Output Summary (Last 24 hours) at 05/07/2019 1215 Last data filed at 05/07/2019 0900 Gross per 24 hour  Intake 1698.23 ml  Output 1250 ml  Net 448.23 ml     Physical Exam  Awake Alert, No new F.N deficits,  Normal affect [place].AT,PERRAL Supple Neck,No JVD, No cervical lymphadenopathy appriciated.  Symmetrical Chest wall movement, Good air movement bilaterally, few RLL rales RRR,No Gallops, Rubs or new Murmurs, No Parasternal Heave +ve B.Sounds, Abd Soft, No tenderness, No organomegaly appriciated, No rebound - guarding or rigidity. No Cyanosis, Clubbing or edema, No new Rash or bruise    Data Review:    CBC Recent Labs  Lab 05/03/19 0344 05/04/19 0502 05/05/19 0719 05/06/19 0655 05/07/19 0517  WBC 18.2* 21.5* 24.0* 23.4* 25.7*  HGB 15.2* 15.5* 14.9 15.1* 13.8  HCT 46.3* 47.4* 45.5 46.0 42.3  PLT 435* 393 406* 332 264  MCV 90.4 92.4 90.1 90.4 92.2  MCH 29.7 30.2 29.5 29.7 30.1  MCHC 32.8 32.7 32.7 32.8 32.6  RDW 13.7 13.9 14.1 14.0 14.2  LYMPHSABS 0.9 1.2 1.8 2.0 2.8  MONOABS 0.4 0.5 0.6 0.8 1.0  EOSABS 0.1 0.1 0.2 0.2 0.3  BASOSABS 0.1 0.1 0.1 0.1 0.0    Chemistries  Recent Labs  Lab 05/01/19 0700 05/01/19 0700 05/02/19 0625 05/02/19 0625 05/03/19 0344 05/04/19 0502 05/05/19 0719 05/06/19 0655 05/07/19 0517  NA 137   < > 138   < > 140 139 139 139 141  K 4.0   < > 4.3   < > 4.0 4.2 4.0 3.9 3.8  CL 104   < > 103   < > 100 99 102 104 106  CO2 24   < > 22   < > _0 GLUCOSE 218*   < > 211*   < > 250* 230* 132* 121* 109*  BUN 17   < > 16   < > _1 CREATININE 1.02*   < > 0.95   < > 1.19* 1.14* 1.08* 1.07* 1.04*  CALCIUM 8.4*   < > 8.6*    < > 8.3* 8.4* 8.2* 8.2* 7.8*  AST 72*   < > 54*  --  49* 44* 68*  --  38  ALT 79*   < > 71*  --  69* 66* 79*  --  51*  ALKPHOS 54   < > 58  --  66 73 80  --  82  BILITOT 0.6   < > 0.8  --  0.9 0.9 0.9  --  1.0  MG 2.4  --  2.2  --  2.2  --   --   --  2.2   < > = values in this interval not displayed.     ------------------------------------------------------------------------------------------------------------------ No results for input(s): CHOL, HDL, LDLCALC, TRIG, CHOLHDL, LDLDIRECT in the last 72 hours.  Lab Results  Component Value Date   HGBA1C 6.2 (H) 04/28/2019   ------------------------------------------------------------------------------------------------------------------ No results for input(s): TSH, T4TOTAL, T3FREE, THYROIDAB in the last 72 hours.  Invalid input(s): FREET3  Cardiac Enzymes No results for input(s): CKMB, TROPONINI, MYOGLOBIN in the last 168 hours.  Invalid input(s): CK ------------------------------------------------------------------------------------------------------------------    Component Value Date/Time   BNP 45.8 05/03/2019 0344    Micro Results Recent Results (from the past 240 hour(s))  Blood Culture (routine x 2)     Status: None   Collection Time: 04/28/19  4:20 PM   Specimen: BLOOD  Result Value Ref Range Status   Specimen Description BLOOD LEFT ANTECUBITAL  Final   Special Requests   Final    BOTTLES DRAWN AEROBIC AND ANAEROBIC Blood Culture adequate volume   Culture   Final    NO GROWTH 5 DAYS Performed  at [facility], [place] [address] [place], [place] [address]    Report Status 05/03/2019 FINAL  Final  Blood Culture (routine x 2)     Status: None   Collection Time: 04/28/19  9:23 PM   Specimen: BLOOD  Result Value Ref Range Status   Specimen Description BLOOD LEFT ARM  Final   Special Requests   Final    BOTTLES DRAWN AEROBIC AND ANAEROBIC Blood Culture results may not be optimal due to an inadequate volume of  blood received in culture bottles   Culture   Final    NO GROWTH 5 DAYS Performed at [facility], [place] [address] [place], [place] [address]    Report Status 05/03/2019 FINAL  Final  Culture, blood (routine x 2)     Status: None (Preliminary result)   Collection Time: 05/06/19 10:44 AM   Specimen: BLOOD  Result Value Ref Range Status   Specimen Description BLOOD SITE NOT SPECIFIED  Final   Special Requests   Final    BOTTLES DRAWN AEROBIC ONLY Blood Culture results may not be optimal due to an inadequate volume of blood received in culture bottles   Culture   Final    NO GROWTH < 24 HOURS Performed at [facility], [place] [address] [place], [place] [address]    Report Status PENDING  Incomplete  Culture, blood (routine x 2)     Status: None (Preliminary result)   Collection Time: 05/06/19 10:47 AM   Specimen: BLOOD RIGHT ARM  Result Value Ref Range Status   Specimen Description BLOOD RIGHT ARM  Final   Special Requests   Final    BOTTLES DRAWN AEROBIC AND ANAEROBIC Blood Culture adequate volume   Culture  Setup Time   Final    GRAM POSITIVE COCCI IN CLUSTERS ANAEROBIC BOTTLE ONLY Organism ID to follow CRITICAL RESULT CALLED TO, READ BACK BY AND VERIFIED WITH: TJaneice Robinson PHARMD, AT 1059 05/07/19 BY D. VANHOOK Performed at [facility], [place] [address] [place],  [address]    Culture GRAM POSITIVE COCCI  Final   Report Status PENDING  Incomplete  Blood Culture ID Panel (Reflexed)     Status: Abnormal   Collection Time: 05/06/19 10:47 AM  Result Value Ref Range Status   Enterococcus species NOT DETECTED NOT DETECTED Final   Listeria monocytogenes NOT DETECTED NOT DETECTED Final   Staphylococcus species DETECTED (A) NOT DETECTED Final    Comment: CRITICAL RESULT CALLED TO, READ BACK BY AND VERIFIED WITH: T. BAUMEISTER PHARMD, AT 1059 05/07/19 BY D. VANHOOK    Staphylococcus aureus (BCID) DETECTED (A) NOT DETECTED Final    Comment: Methicillin (oxacillin)  susceptible Staphylococcus aureus (MSSA). Preferred therapy is anti staphylococcal beta lactam antibiotic (Cefazolin or Nafcillin), unless clinically contraindicated. CRITICAL RESULT CALLED TO, READ BACK BY AND VERIFIED WITH: T. BAUMEISTER PHARMD, AT 1059 05/07/19 BY D. VANHOOK    Methicillin resistance NOT DETECTED NOT DETECTED Final   Streptococcus species NOT DETECTED NOT DETECTED Final   Streptococcus agalactiae NOT DETECTED NOT DETECTED Final   Streptococcus pneumoniae NOT DETECTED NOT DETECTED Final   Streptococcus pyogenes NOT DETECTED NOT DETECTED Final   Acinetobacter baumannii NOT DETECTED NOT DETECTED Final   Enterobacteriaceae species NOT DETECTED NOT DETECTED Final   Enterobacter cloacae complex NOT DETECTED NOT DETECTED Final   Escherichia coli NOT DETECTED NOT DETECTED Final   Klebsiella oxytoca NOT DETECTED NOT DETECTED Final   Klebsiella pneumoniae NOT DETECTED NOT DETECTED  Final   Proteus species NOT DETECTED NOT DETECTED Final   Serratia marcescens NOT DETECTED NOT DETECTED Final   Haemophilus influenzae NOT DETECTED NOT DETECTED Final   Neisseria meningitidis NOT DETECTED NOT DETECTED Final   Pseudomonas aeruginosa NOT DETECTED NOT DETECTED Final   Candida albicans NOT DETECTED NOT DETECTED Final   Candida glabrata NOT DETECTED NOT DETECTED Final   Candida krusei NOT DETECTED NOT DETECTED Final   Candida parapsilosis NOT DETECTED NOT DETECTED Final   Candida tropicalis NOT DETECTED NOT DETECTED Final    Comment: Performed at [facility], [place] [address], [place], [place] [address]  MRSA PCR Screening     Status: Abnormal   Collection Time: 05/06/19 10:59 AM   Specimen: Nasal Mucosa; Nasopharyngeal  Result Value Ref Range Status   MRSA by PCR POSITIVE (A) NEGATIVE Final    Comment:        The GeneXpert MRSA Assay (FDA approved for NASAL specimens only), is one component of a comprehensive MRSA colonization surveillance program. It is not intended to  diagnose MRSA infection nor to guide or monitor treatment for MRSA infections. CRITICAL RESULT CALLED TO, READ BACK BY AND VERIFIED WITH: RN Salley Slaughter [id number] AT 1414 BY CM Performed at [facility], [place] [address] [place], [place] [address]   Expectorated sputum assessment w rflx to resp cult     Status: None   Collection Time: 05/06/19  1:00 PM   Specimen: Expectorated Sputum  Result Value Ref Range Status   Specimen Description Expect. Sput  Final   Special Requests Normal  Final   Sputum evaluation   Final    THIS SPECIMEN IS ACCEPTABLE FOR SPUTUM CULTURE Performed at [facility], [place] [address], [place], [place] [address]    Report Status 05/06/2019 FINAL  Final  Culture, respiratory     Status: None (Preliminary result)   Collection Time: 05/06/19  1:00 PM  Result Value Ref Range Status   Specimen Description Expect. Sput  Final   Special Requests Normal Reflexed from [id number]  Final   Gram Stain   Final    RARE WBC PRESENT, PREDOMINANTLY PMN FEW GRAM POSITIVE COCCI IN PAIRS FEW GRAM NEGATIVE RODS FEW GRAM POSITIVE RODS    Culture   Final    CULTURE REINCUBATED FOR BETTER GROWTH Performed at [facility], [address], [place], [place] [address]    Report Status PENDING  Incomplete    Radiology Reports CT CHEST WO CONTRAST  Result Date: 05/06/2019 CLINICAL DATA:  Chest pain, shortness of breath, pneumomediastinum, acute hypoxic respiratory failure due to COVID-19 viral pneumonia EXAM: CT CHEST WITHOUT CONTRAST TECHNIQUE: Multidetector CT imaging of the chest was performed following the standard protocol without IV contrast. Sagittal and coronal MPR images reconstructed from axial data set. COMPARISON:  None FINDINGS: Cardiovascular: Aorta normal caliber. Heart size normal. No pericardial effusion. Mediastinum/Nodes: Small hiatal hernia. Scattered pneumomediastinum few normal size mediastinal lymph nodes without thoracic adenopathy. Additional soft  tissue gas is seen extending into the axilla and cervical regions bilaterally. Lungs/Pleura: Patchy airspace infiltrates throughout both lungs consistent with multifocal pneumonia and history of COVID-19. Multiple small cavitary foci are identified within the infiltrates in both lungs, greater in lower lobes. No pleural effusion or pneumothorax. No discrete pulmonary mass/nodule. Upper Abdomen: Visualized upper abdomen unremarkable Musculoskeletal: Osseous structures unremarkable. IMPRESSION: Extensive BILATERAL patchy airspace infiltrates consistent with multifocal pneumonia. Multiple cavitary foci are identified in both lungs, greater in the  lower lobes; this is uncommon with COVID-19 alone, suggesting a superimposed cavitary pneumonia or sequela of aspiration pneumonia. Pneumomediastinum with gas extending into the cervical regions and less into the axilla as above; this is of uncertain etiology, could be related to barotrauma, coughing, mechanical ventilation. Electronically Signed   By: Lavonia Dana M.D.   On: 05/06/2019 10:11   DG Chest Port 1 View  Result Date: 05/05/2019 CLINICAL DATA:  Increasing shortness of breath. EXAM: PORTABLE CHEST 1 VIEW COMPARISON:  Chest radiograph 05/03/2019 FINDINGS: There is pneumomediastinum and subcutaneous emphysema throughout the soft tissues of the visualized neck base. There are worsening bilateral diffuse airspace opacities. There is more focal consolidation in the right peripheral lung. No significant pleural effusion. Query tiny right apical pneumothorax. IMPRESSION: 1. Pneumomediastinum and subcutaneous emphysema throughout the soft tissues of the visualized neck base. 2. Interval worsening of bilateral diffuse airspace opacities. 3. Query tiny right apical pneumothorax. Electronically Signed   By: Audie Pinto M.D.   On: 05/05/2019 16:31   DG Chest Port 1 View  Result Date: 05/03/2019 CLINICAL DATA:  Shortness of breath, COVID EXAM: PORTABLE CHEST 1 VIEW  COMPARISON:  04/28/2019 FINDINGS: Cardiomegaly. There is interval increase in heterogeneous bilateral, bibasilar predominant airspace opacity. IMPRESSION: Interval increase in heterogeneous bilateral, bibasilar predominant airspace opacity, consistent with worsened multifocal infection and in keeping with COVID airspace disease. Electronically Signed   By: Eddie Candle M.D.   On: 05/03/2019 10:51   DG Chest Port 1 View  Result Date: 04/28/2019 CLINICAL DATA:  COVID-19 positive, short of breath, hypoxia EXAM: PORTABLE CHEST 1 VIEW COMPARISON:  None. FINDINGS: Single frontal view of the chest demonstrates an unremarkable cardiac silhouette. Diffuse interstitial prominence with patchy bilateral ground-glass opacities greatest at the lung bases. No effusion or pneumothorax. No acute bony abnormalities. IMPRESSION: 1. Multifocal pneumonia compatible with COVID-19.  The Electronically Signed   By: Randa Ngo M.D.   On: 04/28/2019 16:47   VAS [place] LOWER EXTREMITY VENOUS (DVT)  Result Date: 05/03/2019  Lower Venous DVTStudy Indications: Edema, and Swelling.  Comparison Study: no prior Performing Technologist: Abram Sander RVS  Examination Guidelines: A complete evaluation includes B-mode imaging, spectral Doppler, color Doppler, and power Doppler as needed of all accessible portions of each vessel. Bilateral testing is considered an integral part of a complete examination. Limited examinations for reoccurring indications may be performed as noted. The reflux portion of the exam is performed with the patient in reverse Trendelenburg.  +---------+---------------+---------+-----------+----------+--------------+ RIGHT    CompressibilityPhasicitySpontaneityPropertiesThrombus Aging +---------+---------------+---------+-----------+----------+--------------+ CFV      Full           Yes      Yes                                 +---------+---------------+---------+-----------+----------+--------------+ SFJ       Full                                                        +---------+---------------+---------+-----------+----------+--------------+ FV Prox  Full                                                        +---------+---------------+---------+-----------+----------+--------------+  FV Mid   Full                                                        +---------+---------------+---------+-----------+----------+--------------+ FV DistalFull                                                        +---------+---------------+---------+-----------+----------+--------------+ PFV      Full                                                        +---------+---------------+---------+-----------+----------+--------------+ POP      Full           Yes      Yes                                 +---------+---------------+---------+-----------+----------+--------------+ PTV      Full                                                        +---------+---------------+---------+-----------+----------+--------------+ PERO                                                  Not visualized +---------+---------------+---------+-----------+----------+--------------+   +---------+---------------+---------+-----------+----------+--------------+ LEFT     CompressibilityPhasicitySpontaneityPropertiesThrombus Aging +---------+---------------+---------+-----------+----------+--------------+ CFV      Full           Yes      Yes                                 +---------+---------------+---------+-----------+----------+--------------+ SFJ      Full                                                        +---------+---------------+---------+-----------+----------+--------------+ FV Prox  Full                                                        +---------+---------------+---------+-----------+----------+--------------+ FV Mid   Full                                                         +---------+---------------+---------+-----------+----------+--------------+  FV DistalFull                                                        +---------+---------------+---------+-----------+----------+--------------+ PFV      Full                                                        +---------+---------------+---------+-----------+----------+--------------+ POP      Full           Yes      Yes                                 +---------+---------------+---------+-----------+----------+--------------+ PTV      Full                                                        +---------+---------------+---------+-----------+----------+--------------+ PERO                                                  Not visualized +---------+---------------+---------+-----------+----------+--------------+     Summary: BILATERAL: - No evidence of deep vein thrombosis seen in the lower extremities, bilaterally.   *See table(s) above for measurements and observations. Electronically signed by Curt Jews MD on 05/03/2019 at 4:26:06 PM.    Final

## 2019-05-08 LAB — CBC WITH DIFFERENTIAL/PLATELET
Abs Immature Granulocytes: 0.37 10*3/uL — ABNORMAL HIGH (ref 0.00–0.07)
Basophils Absolute: 0.1 10*3/uL (ref 0.0–0.1)
Basophils Relative: 1 %
Eosinophils Absolute: 0.1 10*3/uL (ref 0.0–0.5)
Eosinophils Relative: 1 %
HCT: 39.8 % (ref 36.0–46.0)
Hemoglobin: 13.3 g/dL (ref 12.0–15.0)
Immature Granulocytes: 2 %
Lymphocytes Relative: 17 %
Lymphs Abs: 2.8 10*3/uL (ref 0.7–4.0)
MCH: 30.6 pg (ref 26.0–34.0)
MCHC: 33.4 g/dL (ref 30.0–36.0)
MCV: 91.5 fL (ref 80.0–100.0)
Monocytes Absolute: 0.8 10*3/uL (ref 0.1–1.0)
Monocytes Relative: 5 %
Neutro Abs: 12.1 10*3/uL — ABNORMAL HIGH (ref 1.7–7.7)
Neutrophils Relative %: 74 %
Platelets: 234 10*3/uL (ref 150–400)
RBC: 4.35 MIL/uL (ref 3.87–5.11)
RDW: 14.2 % (ref 11.5–15.5)
WBC: 16.3 10*3/uL — ABNORMAL HIGH (ref 4.0–10.5)
nRBC: 0.2 % (ref 0.0–0.2)

## 2019-05-08 LAB — CULTURE, RESPIRATORY W GRAM STAIN
Culture: NORMAL
Special Requests: NORMAL

## 2019-05-08 LAB — COMPREHENSIVE METABOLIC PANEL
ALT: 45 U/L — ABNORMAL HIGH (ref 0–44)
AST: 32 U/L (ref 15–41)
Albumin: 2.5 g/dL — ABNORMAL LOW (ref 3.5–5.0)
Alkaline Phosphatase: 70 U/L (ref 38–126)
Anion gap: 11 (ref 5–15)
BUN: 9 mg/dL (ref 6–20)
CO2: 24 mmol/L (ref 22–32)
Calcium: 8.1 mg/dL — ABNORMAL LOW (ref 8.9–10.3)
Chloride: 106 mmol/L (ref 98–111)
Creatinine, Ser: 0.96 mg/dL (ref 0.44–1.00)
GFR calc Af Amer: >60 mL/min (ref >60)
GFR calc non Af Amer: >60 mL/min (ref >60)
Glucose, Bld: 113 mg/dL — ABNORMAL HIGH (ref 70–99)
Potassium: 3.6 mmol/L (ref 3.5–5.1)
Sodium: 141 mmol/L (ref 135–145)
Total Bilirubin: 0.9 mg/dL (ref 0.3–1.2)
Total Protein: 5 g/dL — ABNORMAL LOW (ref 6.5–8.1)

## 2019-05-08 LAB — PROCALCITONIN: Procalcitonin: 0.23 ng/mL

## 2019-05-08 LAB — D-DIMER, QUANTITATIVE: D-Dimer, Quant: 5.3 ug/mL-FEU — ABNORMAL HIGH (ref 0.00–0.50)

## 2019-05-08 LAB — MAGNESIUM: Magnesium: 2.2 mg/dL (ref 1.7–2.4)

## 2019-05-08 LAB — C-REACTIVE PROTEIN: CRP: 0.6 mg/dL (ref <1.0)

## 2019-05-08 LAB — GLUCOSE, CAPILLARY
Glucose-Capillary: 141 mg/dL — ABNORMAL HIGH (ref 70–99)
Glucose-Capillary: 116 mg/dL — ABNORMAL HIGH (ref 70–99)
Glucose-Capillary: 115 mg/dL — ABNORMAL HIGH (ref 70–99)

## 2019-05-08 MED ORDER — LACTATED RINGERS IV SOLN: INTRAVENOUS | Status: AC

## 2019-05-08 MED ORDER — MUPIROCIN 2 % EX OINT
1.0000 "application " | TOPICAL_OINTMENT | Freq: Two times a day (BID) | CUTANEOUS | Status: AC
  Administered 2019-05-08 – 2019-05-12 (×10): 1 via NASAL
  Filled 2019-05-08 (×3): qty 22

## 2019-05-08 MED ORDER — ENOXAPARIN SODIUM 60 MG/0.6ML ~~LOC~~ SOLN
60.0000 mg | Freq: Two times a day (BID) | SUBCUTANEOUS | Status: DC
  Administered 2019-05-08 – 2019-05-14 (×13): 60 mg via SUBCUTANEOUS
  Filled 2019-05-08 (×13): qty 0.6

## 2019-05-08 MED ORDER — CHLORHEXIDINE GLUCONATE CLOTH 2 % EX PADS
6.0000 | MEDICATED_PAD | Freq: Every day | CUTANEOUS | Status: AC
  Administered 2019-05-08 – 2019-05-10 (×2): 6 via TOPICAL

## 2019-05-08 NOTE — Plan of Care (Signed)

## 2019-05-08 NOTE — Progress Notes (Signed)
PROGRESS NOTE                                                                                                                                                                                                             Patient Demographics:    Erica [name], is a 49 y.o. female, DOB - Jun 19, 1970, QMV:[id number]  Outpatient Primary MD for the patient is Alroy Dust, L.Marlou Sa, MD    LOS - 10  Admit date - 04/28/2019    Chief Complaint  Patient presents with   COVID   Shortness of Breath       Brief Narrative  Erica [name] is a 49 y.o. female with no significant past medical history presents the emergency department today with increasing short of breath.  Her symptoms started on 04/20/2019, initially was only feeling tired but no short of breath.  She had recently tested positive on 04/21/2019 for COVID-19 pneumonia and was placed on oral steroid treatment without much benefit, came to the ER with shortness of breath and was diagnosed with acute hypoxic respiratory failure due to COVID-19 pneumonia and admitted to the hospital with 6 L nasal cannula oxygen requirement.  She was treated promptly with IV steroids, remdesivir and Actemra, unfortunately her course was complicated by development of pneumomediastinum and small pneumothorax which has contained, she has developed aspiration bacterial pneumonia for which ID is following the patient.  She has 1 out of 4 blood cultures positive for MSSA bacteremia.   Subjective:   Patient in bed, appears comfortable, denies any headache, no fever, no chest pain or pressure, no shortness of breath , no abdominal pain. No focal weakness.   Assessment  & Plan :     1. Acute Hypoxic Resp. Failure due to Acute Covid 19 Viral Pneumonitis during the ongoing 2020 Covid 19 Pandemic - she has severe disease and was treated promptly after appropriate consents with IV Actemra on 04/29/2019 along with steroids  and Remdesivir, she has shown slow but definite improvement, currently down to 1 L nasal cannula oxygen down from 15, will continue to titrate down oxygen and advance activity.  If stable most likely discharge home  on 05/05/2019 on home oxygen which has been ordered.  Fortunately she has developed a small pneumothorax and pneumomediastinum which will be monitored, also signs of possible aspiration pneumonia see #6 below.  Encouraged the patient to sit up in chair in the daytime use I-S and flutter valve for pulmonary toiletry and then prone in bed when at night.  Will advance activity and titrate down oxygen as possible.  SpO2: 95 % O2 Flow Rate (L/min): 2 L/min   Recent Labs  Lab 05/02/19 0625 05/02/19 1139 05/03/19 0344 05/03/19 0344 05/04/19 0502 05/05/19 0719 05/06/19 0655 05/07/19 0517 05/08/19 0530  CRP 0.7  --  0.6   < > 0.5 <0.5 <0.5 0.5 0.6  DDIMER 1.62*  --  2.23*   < > 2.47* 2.88* 3.42* 3.75* 5.30*  BNP 126.0*  --  45.8  --   --   --   --   --   --   PROCALCITON  --    < > 0.11   < > 0.22 0.16 0.24 0.50 0.23   < > = values in this interval not displayed.    Hepatic Function Latest Ref Rng & Units 05/08/2019 05/07/2019 05/05/2019  Total Protein 6.5 - 8.1 g/dL 5.0(L) 5.0(L) 5.7(L)  Albumin 3.5 - 5.0 g/dL 2.5(L) 2.5(L) 2.8(L)  AST 15 - 41 U/L 32 38 68(H)  ALT 0 - 44 U/L 45(H) 51(H) 79(H)  Alk Phosphatase 38 - 126 U/L 70 82 80  Total Bilirubin 0.3 - 1.2 mg/dL 0.9 1.0 0.9  Bilirubin, Direct 0.0 - 0.2 mg/dL - - -      2.  History of shingles.  Currently stable does not take Valtrex anymore at home on a scheduled basis.  3.  Borderline elevated D-dimer.  She has elevated D-dimer despite being on moderately high dose of Lovenox, lower extremity ultrasound negative, patient does have exposure to hormonal contraceptive for which I will place her on 1 month of prophylactic dose Eliquis upon discharge.  Her risk for developing a clot remains very high.  4.  Obesity.  BMI 37.  Follow  with PCP.  5.  Superimposed aspiration bacterial pneumonia.  Patient strictly counseled multiple times to have all meals in the chair, she tends to eat in bed laying flat, cleared by speech, 1/4 bottles growing MSSA, was initially on Unasyn but now switched to cefazolin IV, stable echocardiogram, procalcitonin is stable, CT was discussed with pulmonary, ID following.  Note patient also received Actemra for her severe COVID-19 infection which does predispose her to bacterial infections.  ID following defer course of antibiotic treatment to ID at this point.    Condition - Extremely Guarded  Family Communication  : Mother Deneise Lever [phone] 04/29/2019, 05/03/2019  Code Status : Full  Diet :   Diet Order            Diet regular Room service appropriate? Yes with Assist; Fluid consistency: Thin  Diet effective now               Disposition Plan  : Stay inpatient in the hospital for treatment of severe acute hypoxic respiratory failure due to COVID-19 pneumonia and bacterial aspiration pneumonia.  Finish treatment course, thereafter discharge home.  Consults  : CT  discussed with pulmonary on 05/06/2019. ID  Procedures  :    TTE -   1. Left ventricular ejection fraction, by estimation, is 65 to 70%. The left ventricle has normal function. The left ventricle has no regional wall motion abnormalities.  Left ventricular diastolic parameters are consistent with Grade I diastolic dysfunction (impaired relaxation).  2. Right ventricular systolic function is normal. The right ventricular size is normal.  3. The mitral valve is normal in structure. No evidence of mitral valve regurgitation. No evidence of mitral stenosis.  4. The aortic valve is normal in structure. Aortic valve regurgitation is not visualized. No aortic stenosis is present.  5. The inferior vena cava is normal in size with greater than 50% respiratory variability, suggesting right atrial pressure of 3 mmHg.  Leg [place] - No  DVT  CT - Extensive BILATERAL patchy airspace infiltrates consistent with multifocal pneumonia. Multiple cavitary foci are identified in both lungs, greater in the lower lobes; this is uncommon with COVID-19 alone, suggesting a superimposed cavitary pneumonia or sequela of aspiration pneumonia. Pneumomediastinum with gas extending into the cervical regions and less into the axilla as above; this is of uncertain etiology, could be related to barotrauma, coughing, mechanical ventilation.   PUD Prophylaxis : None  DVT Prophylaxis  :  Lovenox    Lab Results  Component Value Date   PLT 234 05/08/2019    Inpatient Medications  Scheduled Meds:  albuterol  2 puff Inhalation Q6H   Chlorhexidine Gluconate Cloth  6 each Topical Q0600   enoxaparin (LOVENOX) injection  60 mg Subcutaneous Q12H   insulin aspart  0-15 Units Subcutaneous TID WC   insulin aspart  0-5 Units Subcutaneous QHS   insulin glargine  15 Units Subcutaneous Daily   mouth rinse  15 mL Mouth Rinse BID   multivitamin with minerals  1 tablet Oral Daily   mupirocin ointment  1 application Nasal BID   Continuous Infusions:   ceFAZolin (ANCEF) IV 2 g (05/08/19 0530)   PRN Meds:.acetaminophen, guaiFENesin-dextromethorphan, menthol-cetylpyridinium, valACYclovir  Antibiotics  :    Anti-infectives (From admission, onward)   Start     Dose/Rate Route Frequency Ordered Stop   05/07/19 1400  ceFAZolin (ANCEF) IVPB 2g/100 mL premix     2 g 200 mL/hr over 30 Minutes Intravenous Every 8 hours 05/07/19 1244     05/06/19 1200  Ampicillin-Sulbactam (UNASYN) 3 g in sodium chloride 0.9 % 100 mL IVPB  Status:  Discontinued     3 g 200 mL/hr over 30 Minutes Intravenous Every 8 hours 05/06/19 1029 05/07/19 1445   04/30/19 1000  remdesivir 100 mg in sodium chloride 0.9 % 100 mL IVPB     100 mg 200 mL/hr over 30 Minutes Intravenous Daily 04/29/19 0018 05/03/19 0832   04/29/19 1034  valACYclovir (VALTREX) tablet 500 mg    Note to  Pharmacy: Patient taking differently: Take one tablet (500 mg) by mouth twice daily for one week - as needed for breakouts     500 mg Oral 2 times daily between meals and at bedtime PRN 04/29/19 0950     04/29/19 1000  remdesivir 100 mg in sodium chloride 0.9 % 100 mL IVPB  Status:  Discontinued     100 mg 200 mL/hr over 30 Minutes Intravenous Daily 04/28/19 1736 04/29/19 0018   04/29/19 0100  remdesivir 100 mg in sodium chloride 0.9 % 100 mL IVPB     100 mg 200 mL/hr over 30 Minutes Intravenous Every 30 min 04/29/19 0018 04/29/19 0700   04/28/19 1745  remdesivir 200 mg in sodium chloride 0.9% 250 mL IVPB  Status:  Discontinued     200 mg 580 mL/hr over 30 Minutes Intravenous Once 04/28/19 1736 04/29/19 0018  Time Spent in minutes  30   Lala Lund M.D on 05/08/2019 at 10:26 AM  To page go to [email or web address] - password [email or web address]  [facility] -  Office  [phone]    See all Orders from today for further details    Objective:   Vitals:   05/07/19 1555 05/07/19 2056 05/08/19 0456 05/08/19 1000  BP: 104/74 (!) 91/52 115/65 (!) 101/55  Pulse: 87 92 68 91  Resp: _0 Temp: 98.3 F (36.8 C) 98.8 F (37.1 C) 99.4 F (37.4 C) 98.3 F (36.8 C)  TempSrc: Oral Oral Oral Oral  SpO2: 99% 90% 93% 95%  Weight:      Height:        Wt Readings from Last 3 Encounters:  05/02/19 108 kg  01/24/19 106.1 kg  04/29/11 96.6 kg     Intake/Output Summary (Last 24 hours) at 05/08/2019 1026 Last data filed at 05/08/2019 0300 Gross per 24 hour  Intake 780 ml  Output 600 ml  Net 180 ml     Physical Exam  Awake Alert, No new F.N deficits, Normal affect [place].AT,PERRAL Supple Neck,No JVD, No cervical lymphadenopathy appriciated.  Symmetrical Chest wall movement, Good air movement bilaterally, CTAB RRR,No Gallops, Rubs or new Murmurs, No Parasternal Heave +ve B.Sounds, Abd Soft, No tenderness, No organomegaly appriciated, No rebound - guarding or rigidity. No Cyanosis,  Clubbing or edema, No new Rash or bruise    Data Review:    CBC Recent Labs  Lab 05/04/19 0502 05/05/19 0719 05/06/19 0655 05/07/19 0517 05/08/19 0530  WBC 21.5* 24.0* 23.4* 25.7* 16.3*  HGB 15.5* 14.9 15.1* 13.8 13.3  HCT 47.4* 45.5 46.0 42.3 39.8  PLT 393 406* 332 264 234  MCV 92.4 90.1 90.4 92.2 91.5  MCH 30.2 29.5 29.7 30.1 30.6  MCHC 32.7 32.7 32.8 32.6 33.4  RDW 13.9 14.1 14.0 14.2 14.2  LYMPHSABS 1.2 1.8 2.0 2.8 2.8  MONOABS 0.5 0.6 0.8 1.0 0.8  EOSABS 0.1 0.2 0.2 0.3 0.1  BASOSABS 0.1 0.1 0.1 0.0 0.1    Chemistries  Recent Labs  Lab 05/02/19 0625 05/02/19 0625 05/03/19 0344 05/03/19 0344 05/04/19 0502 05/05/19 0719 05/06/19 0655 05/07/19 0517 05/08/19 0530  NA 138   < > 140   < > 139 139 139 141 141  K 4.3   < > 4.0   < > 4.2 4.0 3.9 3.8 3.6  CL 103   < > 100   < > 99 102 104 106 106  CO2 22   < > 26   < > _1 GLUCOSE 211*   < > 250*   < > 230* 132* 121* 109* 113*  BUN 16   < > 19   < > _2 CREATININE 0.95   < > 1.19*   < > 1.14* 1.08* 1.07* 1.04* 0.96  CALCIUM 8.6*   < > 8.3*   < > 8.4* 8.2* 8.2* 7.8* 8.1*  AST 54*   < > 49*  --  44* 68*  --  38 32  ALT 71*   < > 69*  --  66* 79*  --  51* 45*  ALKPHOS 58   < > 66  --  73 80  --  82 70  BILITOT 0.8   < > 0.9  --  0.9 0.9  --  1.0 0.9  MG 2.2  --  2.2  --   --   --   --  2.2 2.2   < > = values in this interval not displayed.     ------------------------------------------------------------------------------------------------------------------ No results for input(s): CHOL, HDL, LDLCALC, TRIG, CHOLHDL, LDLDIRECT in the last 72 hours.  Lab Results  Component Value Date   HGBA1C 6.2 (H) 04/28/2019   ------------------------------------------------------------------------------------------------------------------ No results for input(s): TSH, T4TOTAL, T3FREE, THYROIDAB in the last 72 hours.  Invalid input(s): FREET3  Cardiac Enzymes No results for input(s): CKMB, TROPONINI,  MYOGLOBIN in the last 168 hours.  Invalid input(s): CK ------------------------------------------------------------------------------------------------------------------    Component Value Date/Time   BNP 45.8 05/03/2019 0344    Micro Results Recent Results (from the past 240 hour(s))  Blood Culture (routine x 2)     Status: None   Collection Time: 04/28/19  4:20 PM   Specimen: BLOOD  Result Value Ref Range Status   Specimen Description BLOOD LEFT ANTECUBITAL  Final   Special Requests   Final    BOTTLES DRAWN AEROBIC AND ANAEROBIC Blood Culture adequate volume   Culture   Final    NO GROWTH 5 DAYS Performed at [facility], [address], [place], [place] [address]    Report Status 05/03/2019 FINAL  Final  Blood Culture (routine x 2)     Status: None   Collection Time: 04/28/19  9:23 PM   Specimen: BLOOD  Result Value Ref Range Status   Specimen Description BLOOD LEFT ARM  Final   Special Requests   Final    BOTTLES DRAWN AEROBIC AND ANAEROBIC Blood Culture results may not be optimal due to an inadequate volume of blood received in culture bottles   Culture   Final    NO GROWTH 5 DAYS Performed at [facility], [place] [address] [place], [place] [address]    Report Status 05/03/2019 FINAL  Final  Culture, blood (routine x 2)     Status: None (Preliminary result)   Collection Time: 05/06/19 10:44 AM   Specimen: BLOOD  Result Value Ref Range Status   Specimen Description BLOOD SITE NOT SPECIFIED  Final   Special Requests   Final    BOTTLES DRAWN AEROBIC ONLY Blood Culture results may not be optimal due to an inadequate volume of blood received in culture bottles   Culture   Final    NO GROWTH 2 DAYS Performed at [facility], [place] [address] [place], [place] [address]    Report Status PENDING  Incomplete  Culture, blood (routine x 2)     Status: Abnormal (Preliminary result)   Collection Time: 05/06/19 10:47 AM   Specimen: BLOOD RIGHT ARM  Result Value  Ref Range Status   Specimen Description BLOOD RIGHT ARM  Final   Special Requests   Final    BOTTLES DRAWN AEROBIC AND ANAEROBIC Blood Culture adequate volume   Culture  Setup Time   Final    GRAM POSITIVE COCCI IN CLUSTERS ANAEROBIC BOTTLE ONLY CRITICAL RESULT CALLED TO, READ BACK BY AND VERIFIED WITH: T. BAUMEISTER PHARMD, AT 1059 05/07/19 BY D. VANHOOK    Culture (A)  Final    STAPHYLOCOCCUS AUREUS SUSCEPTIBILITIES TO FOLLOW Performed at [facility], [place] [address], [place], [place] [address]    Report Status PENDING  Incomplete  Blood Culture ID Panel (Reflexed)     Status: Abnormal   Collection Time: 05/06/19 10:47 AM  Result Value Ref Range Status   Enterococcus species NOT DETECTED NOT DETECTED Final   Listeria monocytogenes NOT DETECTED NOT DETECTED Final  Staphylococcus species DETECTED (A) NOT DETECTED Final    Comment: CRITICAL RESULT CALLED TO, READ BACK BY AND VERIFIED WITH: T. BAUMEISTER PHARMD, AT 1059 05/07/19 BY D. VANHOOK    Staphylococcus aureus (BCID) DETECTED (A) NOT DETECTED Final    Comment: Methicillin (oxacillin) susceptible Staphylococcus aureus (MSSA). Preferred therapy is anti staphylococcal beta lactam antibiotic (Cefazolin or Nafcillin), unless clinically contraindicated. CRITICAL RESULT CALLED TO, READ BACK BY AND VERIFIED WITH: T. BAUMEISTER PHARMD, AT 1059 05/07/19 BY D. VANHOOK    Methicillin resistance NOT DETECTED NOT DETECTED Final   Streptococcus species NOT DETECTED NOT DETECTED Final   Streptococcus agalactiae NOT DETECTED NOT DETECTED Final   Streptococcus pneumoniae NOT DETECTED NOT DETECTED Final   Streptococcus pyogenes NOT DETECTED NOT DETECTED Final   Acinetobacter baumannii NOT DETECTED NOT DETECTED Final   Enterobacteriaceae species NOT DETECTED NOT DETECTED Final   Enterobacter cloacae complex NOT DETECTED NOT DETECTED Final   Escherichia coli NOT DETECTED NOT DETECTED Final   Klebsiella oxytoca NOT DETECTED NOT DETECTED Final     Klebsiella pneumoniae NOT DETECTED NOT DETECTED Final   Proteus species NOT DETECTED NOT DETECTED Final   Serratia marcescens NOT DETECTED NOT DETECTED Final   Haemophilus influenzae NOT DETECTED NOT DETECTED Final   Neisseria meningitidis NOT DETECTED NOT DETECTED Final   Pseudomonas aeruginosa NOT DETECTED NOT DETECTED Final   Candida albicans NOT DETECTED NOT DETECTED Final   Candida glabrata NOT DETECTED NOT DETECTED Final   Candida krusei NOT DETECTED NOT DETECTED Final   Candida parapsilosis NOT DETECTED NOT DETECTED Final   Candida tropicalis NOT DETECTED NOT DETECTED Final    Comment: Performed at [facility], [place]. [address] [place], [place] [address]  MRSA PCR Screening     Status: Abnormal   Collection Time: 05/06/19 10:59 AM   Specimen: Nasal Mucosa; Nasopharyngeal  Result Value Ref Range Status   MRSA by PCR POSITIVE (A) NEGATIVE Final    Comment:        The GeneXpert MRSA Assay (FDA approved for NASAL specimens only), is one component of a comprehensive MRSA colonization surveillance program. It is not intended to diagnose MRSA infection nor to guide or monitor treatment for MRSA infections. CRITICAL RESULT CALLED TO, READ BACK BY AND VERIFIED WITH: RN Salley Slaughter [id number] AT 1414 BY CM Performed at [facility], [place] [address] [place], [place] [address]   Expectorated sputum assessment w rflx to resp cult     Status: None   Collection Time: 05/06/19  1:00 PM   Specimen: Expectorated Sputum  Result Value Ref Range Status   Specimen Description Expect. Sput  Final   Special Requests Normal  Final   Sputum evaluation   Final    THIS SPECIMEN IS ACCEPTABLE FOR SPUTUM CULTURE Performed at [facility], [place] [address], [place], [place] [address]    Report Status 05/06/2019 FINAL  Final  Culture, respiratory     Status: None   Collection Time: 05/06/19  1:00 PM  Result Value Ref Range Status   Specimen Description Expect. Sput  Final    Special Requests Normal Reflexed from [id number]  Final   Gram Stain   Final    RARE WBC PRESENT, PREDOMINANTLY PMN FEW GRAM POSITIVE COCCI IN PAIRS FEW GRAM NEGATIVE RODS FEW GRAM POSITIVE RODS    Culture   Final    ABUNDANT Consistent with normal respiratory flora. Performed at [facility], [place] [address] [place], [place] [address]  Report Status 05/08/2019 FINAL  Final  Culture, blood (routine x 2)     Status: None (Preliminary result)   Collection Time: 05/07/19  9:02 PM   Specimen: BLOOD RIGHT ARM  Result Value Ref Range Status   Specimen Description BLOOD RIGHT ARM  Final   Special Requests   Final    BOTTLES DRAWN AEROBIC ONLY Blood Culture results may not be optimal due to an inadequate volume of blood received in culture bottles   Culture   Final    NO GROWTH < 12 HOURS Performed at [facility], [place] [address], [place], [place] [address]    Report Status PENDING  Incomplete  Culture, blood (routine x 2)     Status: None (Preliminary result)   Collection Time: 05/07/19  9:02 PM   Specimen: BLOOD RIGHT ARM  Result Value Ref Range Status   Specimen Description BLOOD RIGHT ARM  Final   Special Requests   Final    BOTTLES DRAWN AEROBIC ONLY Blood Culture adequate volume   Culture   Final    NO GROWTH < 12 HOURS Performed at [facility], [place] [address], [place], [place] [address]    Report Status PENDING  Incomplete    Radiology Reports CT CHEST WO CONTRAST  Result Date: 05/06/2019 CLINICAL DATA:  Chest pain, shortness of breath, pneumomediastinum, acute hypoxic respiratory failure due to COVID-19 viral pneumonia EXAM: CT CHEST WITHOUT CONTRAST TECHNIQUE: Multidetector CT imaging of the chest was performed following the standard protocol without IV contrast. Sagittal and coronal MPR images reconstructed from axial data set. COMPARISON:  None FINDINGS: Cardiovascular: Aorta normal caliber. Heart size normal. No pericardial effusion. Mediastinum/Nodes:  Small hiatal hernia. Scattered pneumomediastinum few normal size mediastinal lymph nodes without thoracic adenopathy. Additional soft tissue gas is seen extending into the axilla and cervical regions bilaterally. Lungs/Pleura: Patchy airspace infiltrates throughout both lungs consistent with multifocal pneumonia and history of COVID-19. Multiple small cavitary foci are identified within the infiltrates in both lungs, greater in lower lobes. No pleural effusion or pneumothorax. No discrete pulmonary mass/nodule. Upper Abdomen: Visualized upper abdomen unremarkable Musculoskeletal: Osseous structures unremarkable. IMPRESSION: Extensive BILATERAL patchy airspace infiltrates consistent with multifocal pneumonia. Multiple cavitary foci are identified in both lungs, greater in the lower lobes; this is uncommon with COVID-19 alone, suggesting a superimposed cavitary pneumonia or sequela of aspiration pneumonia. Pneumomediastinum with gas extending into the cervical regions and less into the axilla as above; this is of uncertain etiology, could be related to barotrauma, coughing, mechanical ventilation. Electronically Signed   By: Lavonia Dana M.D.   On: 05/06/2019 10:11   DG Chest Port 1 View  Result Date: 05/05/2019 CLINICAL DATA:  Increasing shortness of breath. EXAM: PORTABLE CHEST 1 VIEW COMPARISON:  Chest radiograph 05/03/2019 FINDINGS: There is pneumomediastinum and subcutaneous emphysema throughout the soft tissues of the visualized neck base. There are worsening bilateral diffuse airspace opacities. There is more focal consolidation in the right peripheral lung. No significant pleural effusion. Query tiny right apical pneumothorax. IMPRESSION: 1. Pneumomediastinum and subcutaneous emphysema throughout the soft tissues of the visualized neck base. 2. Interval worsening of bilateral diffuse airspace opacities. 3. Query tiny right apical pneumothorax. Electronically Signed   By: Audie Pinto M.D.   On:  05/05/2019 16:31   DG Chest Port 1 View  Result Date: 05/03/2019 CLINICAL DATA:  Shortness of breath, COVID EXAM: PORTABLE CHEST 1 VIEW COMPARISON:  04/28/2019 FINDINGS: Cardiomegaly. There is interval increase in heterogeneous bilateral, bibasilar predominant  airspace opacity. IMPRESSION: Interval increase in heterogeneous bilateral, bibasilar predominant airspace opacity, consistent with worsened multifocal infection and in keeping with COVID airspace disease. Electronically Signed   By: Eddie Candle M.D.   On: 05/03/2019 10:51   DG Chest Port 1 View  Result Date: 04/28/2019 CLINICAL DATA:  COVID-19 positive, short of breath, hypoxia EXAM: PORTABLE CHEST 1 VIEW COMPARISON:  None. FINDINGS: Single frontal view of the chest demonstrates an unremarkable cardiac silhouette. Diffuse interstitial prominence with patchy bilateral ground-glass opacities greatest at the lung bases. No effusion or pneumothorax. No acute bony abnormalities. IMPRESSION: 1. Multifocal pneumonia compatible with COVID-19.  The Electronically Signed   By: Randa Ngo M.D.   On: 04/28/2019 16:47   VAS [place] LOWER EXTREMITY VENOUS (DVT)  Result Date: 05/03/2019  Lower Venous DVTStudy Indications: Edema, and Swelling.  Comparison Study: no prior Performing Technologist: Abram Sander RVS  Examination Guidelines: A complete evaluation includes B-mode imaging, spectral Doppler, color Doppler, and power Doppler as needed of all accessible portions of each vessel. Bilateral testing is considered an integral part of a complete examination. Limited examinations for reoccurring indications may be performed as noted. The reflux portion of the exam is performed with the patient in reverse Trendelenburg.  +---------+---------------+---------+-----------+----------+--------------+  RIGHT     Compressibility Phasicity Spontaneity Properties Thrombus Aging  +---------+---------------+---------+-----------+----------+--------------+  CFV       Full             Yes       Yes                                    +---------+---------------+---------+-----------+----------+--------------+  SFJ       Full                                                             +---------+---------------+---------+-----------+----------+--------------+  FV Prox   Full                                                             +---------+---------------+---------+-----------+----------+--------------+  FV Mid    Full                                                             +---------+---------------+---------+-----------+----------+--------------+  FV Distal Full                                                             +---------+---------------+---------+-----------+----------+--------------+  PFV       Full                                                             +---------+---------------+---------+-----------+----------+--------------+  POP       Full            Yes       Yes                                    +---------+---------------+---------+-----------+----------+--------------+  PTV       Full                                                             +---------+---------------+---------+-----------+----------+--------------+  PERO                                                       Not visualized  +---------+---------------+---------+-----------+----------+--------------+   +---------+---------------+---------+-----------+----------+--------------+  LEFT      Compressibility Phasicity Spontaneity Properties Thrombus Aging  +---------+---------------+---------+-----------+----------+--------------+  CFV       Full            Yes       Yes                                    +---------+---------------+---------+-----------+----------+--------------+  SFJ       Full                                                             +---------+---------------+---------+-----------+----------+--------------+  FV Prox   Full                                                              +---------+---------------+---------+-----------+----------+--------------+  FV Mid    Full                                                             +---------+---------------+---------+-----------+----------+--------------+  FV Distal Full                                                             +---------+---------------+---------+-----------+----------+--------------+  PFV       Full                                                             +---------+---------------+---------+-----------+----------+--------------+  POP       Full            Yes       Yes                                    +---------+---------------+---------+-----------+----------+--------------+  PTV       Full                                                             +---------+---------------+---------+-----------+----------+--------------+  PERO                                                       Not visualized  +---------+---------------+---------+-----------+----------+--------------+     Summary: BILATERAL: - No evidence of deep vein thrombosis seen in the lower extremities, bilaterally.   *See table(s) above for measurements and observations. Electronically signed by Curt Jews MD on 05/03/2019 at 4:26:06 PM.    Final    ECHOCARDIOGRAM LIMITED  Result Date: 05/07/2019    ECHOCARDIOGRAM LIMITED REPORT   Patient Name:   Ekaterina DANAIJA ESKRIDGE Date of Exam: 05/07/2019 Medical Rec #:  [id number]    Height:       65.0 in Accession #:    [id number]   Weight:       238.1 lb Date of Birth:  07-19-1970    BSA:          2.130 m Patient Age:    29 years     BP:           112/61 mmHg Patient Gender: F            HR:           84 bpm. Exam Location:  Inpatient Procedure: Limited Echo, Cardiac Doppler and Color Doppler Indications:    CHF  History:        Patient has no prior history of Echocardiogram examinations.                 Signs/Symptoms:Shortness of Breath. COVID POSITIVE, pneumonia,                 Obesity.  Sonographer:    Dustin Flock Referring Phys: Thurston Taylor  1. Left ventricular ejection fraction, by estimation, is 65 to 70%. The left ventricle has normal function. The left ventricle has no regional wall motion abnormalities. Left ventricular diastolic parameters are consistent with Grade I diastolic dysfunction (impaired relaxation).  2. Right ventricular systolic function is normal. The right ventricular size is normal.  3. The mitral valve is normal in structure. No evidence of mitral valve regurgitation. No evidence of mitral stenosis.  4. The aortic valve is normal in structure. Aortic valve regurgitation is not visualized. No aortic stenosis is present.  5. The inferior vena cava is normal in size with greater than 50% respiratory variability, suggesting right atrial pressure of 3 mmHg. FINDINGS  Left Ventricle: Left ventricular ejection fraction, by estimation, is 65 to 70%. The left ventricle has normal function. The left ventricle has no regional  wall motion abnormalities. The left ventricular internal cavity size was normal in size. There is  no left ventricular hypertrophy. Right Ventricle: The right ventricular size is normal. No increase in right ventricular wall thickness. Right ventricular systolic function is normal. Left Atrium: Left atrial size was normal in size. Right Atrium: Right atrial size was normal in size. Pericardium: There is no evidence of pericardial effusion. Mitral Valve: The mitral valve is normal in structure. Normal mobility of the mitral valve leaflets. No evidence of mitral valve stenosis. Tricuspid Valve: The tricuspid valve is normal in structure. Tricuspid valve regurgitation is not demonstrated. No evidence of tricuspid stenosis. Aortic Valve: The aortic valve is normal in structure. Aortic valve regurgitation is not visualized. No aortic stenosis is present. Pulmonic Valve: The pulmonic valve was normal in structure. Pulmonic valve regurgitation is not visualized. No  evidence of pulmonic stenosis. Aorta: The aortic root is normal in size and structure. Venous: The inferior vena cava is normal in size with greater than 50% respiratory variability, suggesting right atrial pressure of 3 mmHg. IAS/Shunts: No atrial level shunt detected by color flow Doppler.  LEFT VENTRICLE PLAX 2D LVIDd:         4.00 cm  Diastology LVIDs:         2.55 cm  LV e' lateral:   7.40 cm/s LV PW:         0.97 cm  LV E/e' lateral: 7.2 LV IVS:        1.03 cm  LV e' medial:    5.33 cm/s LVOT diam:     1.70 cm  LV E/e' medial:  10.0 LVOT Area:     2.27 cm  RIGHT VENTRICLE RV S prime:     5.11 cm/s LEFT ATRIUM         Index LA diam:    2.80 cm 1.31 cm/m   AORTA Ao Root diam: 2.10 cm MITRAL VALVE MV Area (PHT): 3.48 cm    SHUNTS MV Decel Time: 218 msec    Systemic Diam: 1.70 cm MV E velocity: 53.10 cm/s MV A velocity: 63.00 cm/s MV E/A ratio:  0.84 Candee Furbish MD Electronically signed by Candee Furbish MD Signature Date/Time: 05/07/2019/3:18:45 PM    Final

## 2019-05-09 DIAGNOSIS — R7881 Bacteremia: Secondary | ICD-10-CM

## 2019-05-09 DIAGNOSIS — B9561 Methicillin susceptible Staphylococcus aureus infection as the cause of diseases classified elsewhere: Secondary | ICD-10-CM

## 2019-05-09 LAB — CBC WITH DIFFERENTIAL/PLATELET
Abs Immature Granulocytes: 0.24 10*3/uL — ABNORMAL HIGH (ref 0.00–0.07)
Basophils Absolute: 0.1 10*3/uL (ref 0.0–0.1)
Basophils Relative: 1 %
Eosinophils Absolute: 0.1 10*3/uL (ref 0.0–0.5)
Eosinophils Relative: 1 %
HCT: 40.7 % (ref 36.0–46.0)
Hemoglobin: 13.5 g/dL (ref 12.0–15.0)
Immature Granulocytes: 3 %
Lymphocytes Relative: 17 %
Lymphs Abs: 1.6 10*3/uL (ref 0.7–4.0)
MCH: 30.6 pg (ref 26.0–34.0)
MCHC: 33.2 g/dL (ref 30.0–36.0)
MCV: 92.3 fL (ref 80.0–100.0)
Monocytes Absolute: 0.7 10*3/uL (ref 0.1–1.0)
Monocytes Relative: 7 %
Neutro Abs: 6.8 10*3/uL (ref 1.7–7.7)
Neutrophils Relative %: 71 %
Platelets: 200 10*3/uL (ref 150–400)
RBC: 4.41 MIL/uL (ref 3.87–5.11)
RDW: 14.1 % (ref 11.5–15.5)
WBC: 9.5 10*3/uL (ref 4.0–10.5)
nRBC: 0.3 % — ABNORMAL HIGH (ref 0.0–0.2)

## 2019-05-09 LAB — COMPREHENSIVE METABOLIC PANEL
ALT: 48 U/L — ABNORMAL HIGH (ref 0–44)
AST: 43 U/L — ABNORMAL HIGH (ref 15–41)
Albumin: 2.6 g/dL — ABNORMAL LOW (ref 3.5–5.0)
Alkaline Phosphatase: 64 U/L (ref 38–126)
Anion gap: 9 (ref 5–15)
BUN: 5 mg/dL — ABNORMAL LOW (ref 6–20)
CO2: 23 mmol/L (ref 22–32)
Calcium: 8 mg/dL — ABNORMAL LOW (ref 8.9–10.3)
Chloride: 109 mmol/L (ref 98–111)
Creatinine, Ser: 0.99 mg/dL (ref 0.44–1.00)
GFR calc Af Amer: >60 mL/min (ref >60)
GFR calc non Af Amer: >60 mL/min (ref >60)
Glucose, Bld: 112 mg/dL — ABNORMAL HIGH (ref 70–99)
Potassium: 3.5 mmol/L (ref 3.5–5.1)
Sodium: 141 mmol/L (ref 135–145)
Total Bilirubin: 0.6 mg/dL (ref 0.3–1.2)
Total Protein: 5.3 g/dL — ABNORMAL LOW (ref 6.5–8.1)

## 2019-05-09 LAB — MAGNESIUM: Magnesium: 2.1 mg/dL (ref 1.7–2.4)

## 2019-05-09 LAB — CULTURE, BLOOD (ROUTINE X 2): Special Requests: ADEQUATE

## 2019-05-09 LAB — GLUCOSE, CAPILLARY
Glucose-Capillary: 110 mg/dL — ABNORMAL HIGH (ref 70–99)
Glucose-Capillary: 98 mg/dL (ref 70–99)
Glucose-Capillary: 129 mg/dL — ABNORMAL HIGH (ref 70–99)
Glucose-Capillary: 113 mg/dL — ABNORMAL HIGH (ref 70–99)

## 2019-05-09 LAB — C-REACTIVE PROTEIN: CRP: 0.6 mg/dL (ref <1.0)

## 2019-05-09 LAB — D-DIMER, QUANTITATIVE: D-Dimer, Quant: 7.56 ug/mL-FEU — ABNORMAL HIGH (ref 0.00–0.50)

## 2019-05-09 MED ORDER — ORITAVANCIN DIPHOSPHATE 400 MG IV SOLR
1200.0000 mg | Freq: Once | INTRAVENOUS | Status: AC
  Administered 2019-05-13: 1200 mg via INTRAVENOUS
  Filled 2019-05-09: qty 120

## 2019-05-09 NOTE — Progress Notes (Signed)
Patient ID: Erica [name], female   DOB: 05-16-1970, 49 y.o.   MRN: [id number]          [facility]    Date of Admission:  04/28/2019   Total days of antibiotics 4        Day 3 cefazolin  Ms. Crivello is improving slowly on therapy for recent Covid pneumonia and superimposed MSSA bacteremia.  1 set of blood cultures was positive and repeat blood cultures the following day remain negative.  There is no evidence of endocarditis on TTE.  I think the chance that she has endocarditis is extremely low.  I recommend 3 more days of IV cefazolin and then 1 dose of long-acting oritavancin.  We will place orders for oritavancin and sign off now.  Please call if we can be of further assistance while she is here.         Michel Bickers, MD [facility] [place] [facility] [phone] pager   [phone] cell 05/09/2019, 11:27 AM

## 2019-05-09 NOTE — Progress Notes (Signed)
Physical Therapy Treatment Patient Details Name: Erica [name] MRN: [id number] DOB: [birth year] Today's Date: 05/09/2019    History of Present Illness 49 year old female admitted 04/28/19 with increasing SOB. Patient's symptoms began 04/20/19 with feeling tired and tested positive for COVID on 04/21/19. She started having cough, loss of taste, muscle aches, intermittent low-grade fevers. Oxygen saturation 81% at [facility] infusion clinic on 04/28/19. CXR: bilateral multifocal infiltrates. Patient admitted to the hospital with acute hypoxic respiratory failure due to COVID PNA requiring 6L [place]. IV Actemra given 04/29/19 as well as steroids and Remdesivir. Borderline elevated D-dimer. Started on Lovenox.  No significant PMH.    PT Comments    Patient with improvement in her mobility tolerance. She was able to ambulate 78ft trial 1 without AD and 5ft trial 2 with RW. Mildly unsteady with increased lateral sway without use of AD. Oxygen saturation >/=89% gait trial 1 until patient sat down and O2 saturation down to 82% but not sustained, quickly to 86%, and with seated recovery in reclined position approx 1 minute O2 saturation 92%. Gait trial 2 with RW approx 41ft on room air with supervision to contact guard assistance with oxygen saturation down to mid 80s% as patient ambulated back to her room, as low as 81% but recovered in less than 30 seconds seated rest (not in reclined position) to 90%. Patietn rates SOB 5/10 trial 1 and 6/10 trial 2.  Anticipate patient will be able to progress her mobility in order to discharge home with home PT. She may benefit from a RW for energy conservation and a BSC to increase safety at night and for energy conservation.   Follow Up Recommendations  Home health PT;Supervision - Intermittent     Equipment Recommendations  Rolling walker with 5" wheels;3in1 (PT)       Precautions / Restrictions Precautions Precautions: Fall;Other (comment) Precaution Comments: monitor  oxygen saturation Restrictions Weight Bearing Restrictions: No    Mobility  Bed Mobility General bed mobility comments: Patient aleady OOB in recliner chair.  Transfers Overall transfer level: Needs assistance Equipment used: None;Rolling walker (2 wheeled) Transfers: Sit to/from Stand Sit to Stand: Supervision;Min guard General transfer comment: trial 1: sit<>stand without AD, trial 2: sit<>stand with RW  Ambulation/Gait Ambulation/Gait assistance: Min guard;Min assist;Supervision Gait Distance (Feet): 80 Feet Assistive device: None;Rolling walker (2 wheeled) Gait Pattern/deviations: Step-through pattern;Staggering left;Staggering right(increased lateral sway, increased stance times) Gait velocity: decreased   General Gait Details: Patient on room air for ambulation trials, mildly unsteady and increased lateral sway without AD but no LOB. Oxygen saturation >/=89% gait trial 1 until patient sat down and O2 saturation down to 82% but not sustained, quickly to 86%, and with seated recovery in reclined position approx 1 minute O2 saturation 92%. Gait trial 2 with RW approx 53ft on room air with oxygen saturation down as patient ambulated back to her room, as low as 81% but recovered in less than 30 seconds seated rest (not in reclined position) to 90%. Patietn rates SOB 5/10 trial 1 and 6/10 trial 2.      Balance Overall balance assessment: Needs assistance  Standing balance support: No upper extremity supported Standing balance-Leahy Scale: (Good-)    Cognition Arousal/Alertness: Awake/alert Behavior During Therapy: WFL for tasks assessed/performed Overall Cognitive Status: Within Functional Limits for tasks assessed       General Comments General comments (skin integrity, edema, etc.): Patient on room air. Oxygen saturation stable at rest. She does desat with ambulation in hallway but recovers  within 30 seconds to 1 minute seated rest.      Pertinent Vitals/Pain Pain  Assessment: No/denies pain           PT Goals (current goals can now be found in the care plan section) Progress towards PT goals: Progressing toward goals    Frequency    Min 3X/week      PT Plan Current plan remains appropriate       AM-PAC PT "6 Clicks" Mobility   Outcome Measure  Help needed turning from your back to your side while in a flat bed without using bedrails?: None Help needed moving from lying on your back to sitting on the side of a flat bed without using bedrails?: None Help needed moving to and from a bed to a chair (including a wheelchair)?: A Little Help needed standing up from a chair using your arms (e.g., wheelchair or bedside chair)?: None Help needed to walk in hospital room?: A Little Help needed climbing 3-5 steps with a railing? : A Little 6 Click Score: 21    End of Session Equipment Utilized During Treatment: Gait belt Activity Tolerance: Patient tolerated treatment well;Patient limited by fatigue Patient left: in chair;with call bell/phone within reach Nurse Communication: Mobility status;Other (comment)(oxygen saturation response) PT Visit Diagnosis: Other abnormalities of gait and mobility (R26.89)     Time: [id number] PT Time Calculation (min) (ACUTE ONLY): 31 min  Charges:  $Gait Training: 23-37 mins                     Erica [name], PT, DPT Acute Rehab [phone] office     Erica [name] 05/09/2019, 11:03 AM

## 2019-05-09 NOTE — Plan of Care (Signed)

## 2019-05-09 NOTE — Progress Notes (Signed)
PROGRESS NOTE                                                                                                                                                                                                             Patient Demographics:    Erica [name], is a 49 y.o. female, DOB - 03-22-1970, RSW:[id number]  Outpatient Primary MD for the patient is Alroy Dust, L.Marlou Sa, MD    LOS - 11  Admit date - 04/28/2019    Chief Complaint  Patient presents with  . COVID  . Shortness of Breath       Brief Narrative    - Erica [name] is a 49 y.o. female with no significant past medical history presents the emergency department today with increasing short of breath.  Her symptoms started on 04/20/2019, initially was only feeling tired but no short of breath.  She had recently tested positive on 04/21/2019 for COVID-19 pneumonia and was placed on oral steroid treatment without much benefit, came to the ER with shortness of breath and was diagnosed with acute hypoxic respiratory failure due to COVID-19 pneumonia and admitted to the hospital with 6 L nasal cannula oxygen requirement.  She was treated promptly with IV steroids, remdesivir and Actemra, unfortunately her course was complicated by development of pneumomediastinum and small pneumothorax which has contained, she has developed aspiration bacterial pneumonia for which ID is following the patient.  She has 1 out of 4 blood cultures positive for MSSA bacteremia.   Subjective:   Patient in bed, appears comfortable, denies any headache, no fever, no chest pain or pressure, she still reporting cough, with productive yellow phlegm.   Assessment  & Plan :    Acute Hypoxic Resp. Failure due to Acute Covid 19 Viral Pneumonitis during the ongoing 2020 Covid 19 Pandemic  - she has severe disease and was treated promptly after appropriate consents with IV Actemra on 04/29/2019 along with steroids and  Remdesivir, she has shown slow but definite improvement, currently room air at rest, down from 15L/ minute. -Unfortunately she has developed a small pneumothorax and pneumomediastinum which will be monitored, also signs of possible aspiration pneumonia see #6 below.  Encouraged the patient to sit up in chair in the daytime use I-S and flutter valve for pulmonary toiletry and then prone in bed when at night.  Will  advance activity and titrate down oxygen as possible.  SpO2: 95 % O2 Flow Rate (L/min): 2 L/min   Recent Labs  Lab 05/03/19 0344 05/03/19 0344 05/04/19 0502 05/04/19 0502 05/05/19 0719 05/06/19 0655 05/07/19 0517 05/08/19 0530 05/09/19 0740  CRP 0.6   < > 0.5   < > <0.5 <0.5 0.5 0.6 0.6  DDIMER 2.23*   < > 2.47*   < > 2.88* 3.42* 3.75* 5.30* 7.56*  BNP 45.8  --   --   --   --   --   --   --   --   PROCALCITON 0.11   < > 0.22  --  0.16 0.24 0.50 0.23  --    < > = values in this interval not displayed.    Hepatic Function Latest Ref Rng & Units 05/09/2019 05/08/2019 05/07/2019  Total Protein 6.5 - 8.1 g/dL 5.3(L) 5.0(L) 5.0(L)  Albumin 3.5 - 5.0 g/dL 2.6(L) 2.5(L) 2.5(L)  AST 15 - 41 U/L 43(H) 32 38  ALT 0 - 44 U/L 48(H) 45(H) 51(H)  Alk Phosphatase 38 - 126 U/L 64 70 82  Total Bilirubin 0.3 - 1.2 mg/dL 0.6 0.9 1.0  Bilirubin, Direct 0.0 - 0.2 mg/dL - - -   MSSA bacteremia - 1 set of blood cultures was positive . - repeat blood cultures the following day remain negative.   - There is no evidence of endocarditis on TTE.   -Antibiotics management per ID, recommendation for 3 more days of IV cefazolin and then 1 dose of long-acting oritavancin.                                 History of shingles.   - Currently stable does not take Valtrex anymore at home on a scheduled basis.   Borderline elevated D-dimer.   -She has elevated D-dimer despite being on moderately high dose of Lovenox, lower extremity ultrasound negative, patient does have exposure to hormonal contraceptive  for which I will place her on 1 month of prophylactic dose Eliquis upon discharge.  Her risk for developing a clot remains very high.  Obesity.  BMI 37.  Follow with PCP.  Superimposed aspiration bacterial pneumonia.  -  Patient strictly counseled multiple times to have all meals in the chair, she tends to eat in bed laying flat, cleared by speech, 1/4 bottles growing MSSA, was initially on Unasyn but now switched to cefazolin IV, stable echocardiogram, procalcitonin is stable, CT was discussed with pulmonary, ID following.  Note patient also received Actemra for her severe COVID-19 infection which does predispose her to bacterial infections.  ID following defer course of antibiotic treatment to ID at this point.    Condition - Extremely Guarded  Family Communication  : D/W patient  Code Status : Full  Diet :   Diet Order            Diet regular Room service appropriate? Yes with Assist; Fluid consistency: Thin  Diet effective now               Disposition Plan  : Stay inpatient in the hospital for treatment of severe acute hypoxic respiratory failure due to COVID-19 pneumonia and bacterial aspiration pneumonia.  Finish treatment course, thereafter discharge home.  Consults  : CT  discussed with pulmonary on 05/06/2019. ID  Procedures  :    TTE -   1. Left ventricular ejection fraction, by estimation, is 65  to 70%. The left ventricle has normal function. The left ventricle has no regional wall motion abnormalities. Left ventricular diastolic parameters are consistent with Grade I diastolic dysfunction (impaired relaxation).  2. Right ventricular systolic function is normal. The right ventricular size is normal.  3. The mitral valve is normal in structure. No evidence of mitral valve regurgitation. No evidence of mitral stenosis.  4. The aortic valve is normal in structure. Aortic valve regurgitation is not visualized. No aortic stenosis is present.  5. The inferior vena cava  is normal in size with greater than 50% respiratory variability, suggesting right atrial pressure of 3 mmHg.  Leg [place] - No DVT  CT - Extensive BILATERAL patchy airspace infiltrates consistent with multifocal pneumonia. Multiple cavitary foci are identified in both lungs, greater in the lower lobes; this is uncommon with COVID-19 alone, suggesting a superimposed cavitary pneumonia or sequela of aspiration pneumonia. Pneumomediastinum with gas extending into the cervical regions and less into the axilla as above; this is of uncertain etiology, could be related to barotrauma, coughing, mechanical ventilation.   PUD Prophylaxis : None  DVT Prophylaxis  :  Lovenox    Lab Results  Component Value Date   PLT 200 05/09/2019    Inpatient Medications  Scheduled Meds: . albuterol  2 puff Inhalation Q6H  . Chlorhexidine Gluconate Cloth  6 each Topical Q0600  . enoxaparin (LOVENOX) injection  60 mg Subcutaneous Q12H  . insulin aspart  0-15 Units Subcutaneous TID WC  . insulin aspart  0-5 Units Subcutaneous QHS  . insulin glargine  15 Units Subcutaneous Daily  . mouth rinse  15 mL Mouth Rinse BID  . multivitamin with minerals  1 tablet Oral Daily  . mupirocin ointment  1 application Nasal BID   Continuous Infusions: .  ceFAZolin (ANCEF) IV 2 g (05/09/19 1537)  . [START ON 05/13/2019] oritavancin (ORBACTIV) IVPB     PRN Meds:.acetaminophen, guaiFENesin-dextromethorphan, menthol-cetylpyridinium, valACYclovir  Antibiotics  :    Anti-infectives (From admission, onward)   Start     Dose/Rate Route Frequency Ordered Stop   05/13/19 1200  Oritavancin Diphosphate (ORBACTIV) 1,200 mg in dextrose 5 % IVPB     1,200 mg 333.3 mL/hr over 180 Minutes Intravenous Once 05/09/19 1126     05/07/19 1400  ceFAZolin (ANCEF) IVPB 2g/100 mL premix     2 g 200 mL/hr over 30 Minutes Intravenous Every 8 hours 05/07/19 1244 05/13/19 2359   05/06/19 1200  Ampicillin-Sulbactam (UNASYN) 3 g in sodium chloride 0.9 %  100 mL IVPB  Status:  Discontinued     3 g 200 mL/hr over 30 Minutes Intravenous Every 8 hours 05/06/19 1029 05/07/19 1445   04/30/19 1000  remdesivir 100 mg in sodium chloride 0.9 % 100 mL IVPB     100 mg 200 mL/hr over 30 Minutes Intravenous Daily 04/29/19 0018 05/03/19 0832   04/29/19 1034  valACYclovir (VALTREX) tablet 500 mg    Note to Pharmacy: Patient taking differently: Take one tablet (500 mg) by mouth twice daily for one week - as needed for breakouts     500 mg Oral 2 times daily between meals and at bedtime PRN 04/29/19 0950     04/29/19 1000  remdesivir 100 mg in sodium chloride 0.9 % 100 mL IVPB  Status:  Discontinued     100 mg 200 mL/hr over 30 Minutes Intravenous Daily 04/28/19 1736 04/29/19 0018   04/29/19 0100  remdesivir 100 mg in sodium chloride 0.9 % 100 mL IVPB  100 mg 200 mL/hr over 30 Minutes Intravenous Every 30 min 04/29/19 0018 04/29/19 0700   04/28/19 1745  remdesivir 200 mg in sodium chloride 0.9% 250 mL IVPB  Status:  Discontinued     200 mg 580 mL/hr over 30 Minutes Intravenous Once 04/28/19 1736 04/29/19 0018       Time Spent in minutes  30   Phillips Climes M.D on 05/09/2019 at 4:28 PM  To page go to [email or web address] - password [facility]  [facility] -  Office  [phone]    See all Orders from today for further details    Objective:   Vitals:   05/08/19 1000 05/08/19 1950 05/09/19 0200 05/09/19 0202  BP: (!) 101/55   131/72  Pulse: 91   92  Resp:    (!) 21  Temp: 98.3 F (36.8 C) 98.7 F (37.1 C)  98.9 F (37.2 C)  TempSrc: Oral Oral Oral Oral  SpO2: 95% 95%  95%  Weight:      Height:        Wt Readings from Last 3 Encounters:  05/02/19 108 kg  01/24/19 106.1 kg  04/29/11 96.6 kg     Intake/Output Summary (Last 24 hours) at 05/09/2019 1628 Last data filed at 05/09/2019 0900 Gross per 24 hour  Intake 383.17 ml  Output 420 ml  Net -36.83 ml     Physical Exam  Awake Alert, Oriented X 3, No new F.N deficits, Normal  affect Symmetrical Chest wall movement, Good air movement bilaterally, CTAB RRR,No Gallops,Rubs or new Murmurs, No Parasternal Heave +ve B.Sounds, Abd Soft, No tenderness, No rebound - guarding or rigidity. No Cyanosis, Clubbing or edema, No new Rash or bruise      Data Review:    CBC Recent Labs  Lab 05/05/19 0719 05/06/19 0655 05/07/19 0517 05/08/19 0530 05/09/19 0740  WBC 24.0* 23.4* 25.7* 16.3* 9.5  HGB 14.9 15.1* 13.8 13.3 13.5  HCT 45.5 46.0 42.3 39.8 40.7  PLT 406* 332 264 234 200  MCV 90.1 90.4 92.2 91.5 92.3  MCH 29.5 29.7 30.1 30.6 30.6  MCHC 32.7 32.8 32.6 33.4 33.2  RDW 14.1 14.0 14.2 14.2 14.1  LYMPHSABS 1.8 2.0 2.8 2.8 1.6  MONOABS 0.6 0.8 1.0 0.8 0.7  EOSABS 0.2 0.2 0.3 0.1 0.1  BASOSABS 0.1 0.1 0.0 0.1 0.1    Chemistries  Recent Labs  Lab 05/03/19 0344 05/03/19 0344 05/04/19 0502 05/04/19 0502 05/05/19 0719 05/06/19 0655 05/07/19 0517 05/08/19 0530 05/09/19 0740  NA 140   < > 139   < > 139 139 141 141 141  K 4.0   < > 4.2   < > 4.0 3.9 3.8 3.6 3.5  CL 100   < > 99   < > 102 104 106 106 109  CO2 26   < > 27   < > 24 24 25 24 23   GLUCOSE 250*   < > 230*   < > 132* 121* 109* 113* 112*  BUN 19   < > 17   < > 17 14 9 9  5*  CREATININE 1.19*   < > 1.14*   < > 1.08* 1.07* 1.04* 0.96 0.99  CALCIUM 8.3*   < > 8.4*   < > 8.2* 8.2* 7.8* 8.1* 8.0*  AST 49*   < > 44*  --  68*  --  38 32 43*  ALT 69*   < > 66*  --  79*  --  51* 45* 48*  ALKPHOS  66   < > 73  --  80  --  82 70 64  BILITOT 0.9   < > 0.9  --  0.9  --  1.0 0.9 0.6  MG 2.2  --   --   --   --   --  2.2 2.2 2.1   < > = values in this interval not displayed.     ------------------------------------------------------------------------------------------------------------------ No results for input(s): CHOL, HDL, LDLCALC, TRIG, CHOLHDL, LDLDIRECT in the last 72 hours.  Lab Results  Component Value Date   HGBA1C 6.2 (H) 04/28/2019    ------------------------------------------------------------------------------------------------------------------ No results for input(s): TSH, T4TOTAL, T3FREE, THYROIDAB in the last 72 hours.  Invalid input(s): FREET3  Cardiac Enzymes No results for input(s): CKMB, TROPONINI, MYOGLOBIN in the last 168 hours.  Invalid input(s): CK ------------------------------------------------------------------------------------------------------------------    Component Value Date/Time   BNP 45.8 05/03/2019 0344    Micro Results Recent Results (from the past 240 hour(s))  Culture, blood (routine x 2)     Status: None (Preliminary result)   Collection Time: 05/06/19 10:44 AM   Specimen: BLOOD  Result Value Ref Range Status   Specimen Description BLOOD SITE NOT SPECIFIED  Final   Special Requests   Final    BOTTLES DRAWN AEROBIC ONLY Blood Culture results may not be optimal due to an inadequate volume of blood received in culture bottles   Culture   Final    NO GROWTH 3 DAYS Performed at [facility], [place] [address] [place], [place] [address]    Report Status PENDING  Incomplete  Culture, blood (routine x 2)     Status: Abnormal   Collection Time: 05/06/19 10:47 AM   Specimen: BLOOD RIGHT ARM  Result Value Ref Range Status   Specimen Description BLOOD RIGHT ARM  Final   Special Requests   Final    BOTTLES DRAWN AEROBIC AND ANAEROBIC Blood Culture adequate volume   Culture  Setup Time   Final    GRAM POSITIVE COCCI IN CLUSTERS ANAEROBIC BOTTLE ONLY CRITICAL RESULT CALLED TO, READ BACK BY AND VERIFIED WITH: TJaneice Robinson PHARMD, AT 1059 05/07/19 BY D. VANHOOK Performed at [facility], [place] [address] [place], [place] [address]    Culture STAPHYLOCOCCUS AUREUS (A)  Final   Report Status 05/09/2019 FINAL  Final   Organism ID, Bacteria STAPHYLOCOCCUS AUREUS  Final      Susceptibility   Staphylococcus aureus - MIC*    CIPROFLOXACIN <=0.5 SENSITIVE Sensitive     ERYTHROMYCIN  <=0.25 SENSITIVE Sensitive     GENTAMICIN <=0.5 SENSITIVE Sensitive     OXACILLIN <=0.25 SENSITIVE Sensitive     TETRACYCLINE <=1 SENSITIVE Sensitive     VANCOMYCIN 1 SENSITIVE Sensitive     TRIMETH/SULFA <=10 SENSITIVE Sensitive     CLINDAMYCIN <=0.25 SENSITIVE Sensitive     RIFAMPIN <=0.5 SENSITIVE Sensitive     Inducible Clindamycin NEGATIVE Sensitive     * STAPHYLOCOCCUS AUREUS  Blood Culture ID Panel (Reflexed)     Status: Abnormal   Collection Time: 05/06/19 10:47 AM  Result Value Ref Range Status   Enterococcus species NOT DETECTED NOT DETECTED Final   Listeria monocytogenes NOT DETECTED NOT DETECTED Final   Staphylococcus species DETECTED (A) NOT DETECTED Final    Comment: CRITICAL RESULT CALLED TO, READ BACK BY AND VERIFIED WITH: T. BAUMEISTER PHARMD, AT 1059 05/07/19 BY D. VANHOOK    Staphylococcus aureus (BCID) DETECTED (A) NOT DETECTED Final    Comment: Methicillin (oxacillin) susceptible  Staphylococcus aureus (MSSA). Preferred therapy is anti staphylococcal beta lactam antibiotic (Cefazolin or Nafcillin), unless clinically contraindicated. CRITICAL RESULT CALLED TO, READ BACK BY AND VERIFIED WITH: T. BAUMEISTER PHARMD, AT 1059 05/07/19 BY D. VANHOOK    Methicillin resistance NOT DETECTED NOT DETECTED Final   Streptococcus species NOT DETECTED NOT DETECTED Final   Streptococcus agalactiae NOT DETECTED NOT DETECTED Final   Streptococcus pneumoniae NOT DETECTED NOT DETECTED Final   Streptococcus pyogenes NOT DETECTED NOT DETECTED Final   Acinetobacter baumannii NOT DETECTED NOT DETECTED Final   Enterobacteriaceae species NOT DETECTED NOT DETECTED Final   Enterobacter cloacae complex NOT DETECTED NOT DETECTED Final   Escherichia coli NOT DETECTED NOT DETECTED Final   Klebsiella oxytoca NOT DETECTED NOT DETECTED Final   Klebsiella pneumoniae NOT DETECTED NOT DETECTED Final   Proteus species NOT DETECTED NOT DETECTED Final   Serratia marcescens NOT DETECTED NOT DETECTED Final     Haemophilus influenzae NOT DETECTED NOT DETECTED Final   Neisseria meningitidis NOT DETECTED NOT DETECTED Final   Pseudomonas aeruginosa NOT DETECTED NOT DETECTED Final   Candida albicans NOT DETECTED NOT DETECTED Final   Candida glabrata NOT DETECTED NOT DETECTED Final   Candida krusei NOT DETECTED NOT DETECTED Final   Candida parapsilosis NOT DETECTED NOT DETECTED Final   Candida tropicalis NOT DETECTED NOT DETECTED Final    Comment: Performed at [facility], [place]. [address] [place], [place] [address]  MRSA PCR Screening     Status: Abnormal   Collection Time: 05/06/19 10:59 AM   Specimen: Nasal Mucosa; Nasopharyngeal  Result Value Ref Range Status   MRSA by PCR POSITIVE (A) NEGATIVE Final    Comment:        The GeneXpert MRSA Assay (FDA approved for NASAL specimens only), is one component of a comprehensive MRSA colonization surveillance program. It is not intended to diagnose MRSA infection nor to guide or monitor treatment for MRSA infections. CRITICAL RESULT CALLED TO, READ BACK BY AND VERIFIED WITH: RN Salley Slaughter [id number] AT 1414 BY CM Performed at [facility], [place] [address], [place], [place] [address]   Expectorated sputum assessment w rflx to resp cult     Status: None   Collection Time: 05/06/19  1:00 PM   Specimen: Expectorated Sputum  Result Value Ref Range Status   Specimen Description Expect. Sput  Final   Special Requests Normal  Final   Sputum evaluation   Final    THIS SPECIMEN IS ACCEPTABLE FOR SPUTUM CULTURE Performed at [facility], [place] [address] [place], [place] [address]    Report Status 05/06/2019 FINAL  Final  Culture, respiratory     Status: None   Collection Time: 05/06/19  1:00 PM  Result Value Ref Range Status   Specimen Description Expect. Sput  Final   Special Requests Normal Reflexed from [id number]  Final   Gram Stain   Final    RARE WBC PRESENT, PREDOMINANTLY PMN FEW GRAM POSITIVE COCCI IN PAIRS FEW GRAM NEGATIVE  RODS FEW GRAM POSITIVE RODS    Culture   Final    ABUNDANT Consistent with normal respiratory flora. Performed at [facility], [place] [address], [place], [place] [address]    Report Status 05/08/2019 FINAL  Final  Culture, blood (routine x 2)     Status: None (Preliminary result)   Collection Time: 05/07/19  9:02 PM   Specimen: BLOOD RIGHT ARM  Result Value Ref Range Status   Specimen Description BLOOD  RIGHT ARM  Final   Special Requests   Final    BOTTLES DRAWN AEROBIC ONLY Blood Culture results may not be optimal due to an inadequate volume of blood received in culture bottles   Culture   Final    NO GROWTH 2 DAYS Performed at [facility], [place] [address], [place], [place] [address]    Report Status PENDING  Incomplete  Culture, blood (routine x 2)     Status: None (Preliminary result)   Collection Time: 05/07/19  9:02 PM   Specimen: BLOOD RIGHT ARM  Result Value Ref Range Status   Specimen Description BLOOD RIGHT ARM  Final   Special Requests   Final    BOTTLES DRAWN AEROBIC ONLY Blood Culture adequate volume   Culture   Final    NO GROWTH 2 DAYS Performed at [facility], [place] [address], [place], [place] [address]    Report Status PENDING  Incomplete    Radiology Reports CT CHEST WO CONTRAST  Result Date: 05/06/2019 CLINICAL DATA:  Chest pain, shortness of breath, pneumomediastinum, acute hypoxic respiratory failure due to COVID-19 viral pneumonia EXAM: CT CHEST WITHOUT CONTRAST TECHNIQUE: Multidetector CT imaging of the chest was performed following the standard protocol without IV contrast. Sagittal and coronal MPR images reconstructed from axial data set. COMPARISON:  None FINDINGS: Cardiovascular: Aorta normal caliber. Heart size normal. No pericardial effusion. Mediastinum/Nodes: Small hiatal hernia. Scattered pneumomediastinum few normal size mediastinal lymph nodes without thoracic adenopathy. Additional soft tissue gas is seen extending into the axilla  and cervical regions bilaterally. Lungs/Pleura: Patchy airspace infiltrates throughout both lungs consistent with multifocal pneumonia and history of COVID-19. Multiple small cavitary foci are identified within the infiltrates in both lungs, greater in lower lobes. No pleural effusion or pneumothorax. No discrete pulmonary mass/nodule. Upper Abdomen: Visualized upper abdomen unremarkable Musculoskeletal: Osseous structures unremarkable. IMPRESSION: Extensive BILATERAL patchy airspace infiltrates consistent with multifocal pneumonia. Multiple cavitary foci are identified in both lungs, greater in the lower lobes; this is uncommon with COVID-19 alone, suggesting a superimposed cavitary pneumonia or sequela of aspiration pneumonia. Pneumomediastinum with gas extending into the cervical regions and less into the axilla as above; this is of uncertain etiology, could be related to barotrauma, coughing, mechanical ventilation. Electronically Signed   By: Lavonia Dana M.D.   On: 05/06/2019 10:11   DG Chest Port 1 View  Result Date: 05/05/2019 CLINICAL DATA:  Increasing shortness of breath. EXAM: PORTABLE CHEST 1 VIEW COMPARISON:  Chest radiograph 05/03/2019 FINDINGS: There is pneumomediastinum and subcutaneous emphysema throughout the soft tissues of the visualized neck base. There are worsening bilateral diffuse airspace opacities. There is more focal consolidation in the right peripheral lung. No significant pleural effusion. Query tiny right apical pneumothorax. IMPRESSION: 1. Pneumomediastinum and subcutaneous emphysema throughout the soft tissues of the visualized neck base. 2. Interval worsening of bilateral diffuse airspace opacities. 3. Query tiny right apical pneumothorax. Electronically Signed   By: Audie Pinto M.D.   On: 05/05/2019 16:31   DG Chest Port 1 View  Result Date: 05/03/2019 CLINICAL DATA:  Shortness of breath, COVID EXAM: PORTABLE CHEST 1 VIEW COMPARISON:  04/28/2019 FINDINGS: Cardiomegaly.  There is interval increase in heterogeneous bilateral, bibasilar predominant airspace opacity. IMPRESSION: Interval increase in heterogeneous bilateral, bibasilar predominant airspace opacity, consistent with worsened multifocal infection and in keeping with COVID airspace disease. Electronically Signed   By: Eddie Candle M.D.   On: 05/03/2019 10:51   DG Chest [facility] 1 View  Result  Date: 04/28/2019 CLINICAL DATA:  COVID-19 positive, short of breath, hypoxia EXAM: PORTABLE CHEST 1 VIEW COMPARISON:  None. FINDINGS: Single frontal view of the chest demonstrates an unremarkable cardiac silhouette. Diffuse interstitial prominence with patchy bilateral ground-glass opacities greatest at the lung bases. No effusion or pneumothorax. No acute bony abnormalities. IMPRESSION: 1. Multifocal pneumonia compatible with COVID-19.  The Electronically Signed   By: Randa Ngo M.D.   On: 04/28/2019 16:47   VAS [place] LOWER EXTREMITY VENOUS (DVT)  Result Date: 05/03/2019  Lower Venous DVTStudy Indications: Edema, and Swelling.  Comparison Study: no prior Performing Technologist: Abram Sander RVS  Examination Guidelines: A complete evaluation includes B-mode imaging, spectral Doppler, color Doppler, and power Doppler as needed of all accessible portions of each vessel. Bilateral testing is considered an integral part of a complete examination. Limited examinations for reoccurring indications may be performed as noted. The reflux portion of the exam is performed with the patient in reverse Trendelenburg.  +---------+---------------+---------+-----------+----------+--------------+ RIGHT    CompressibilityPhasicitySpontaneityPropertiesThrombus Aging +---------+---------------+---------+-----------+----------+--------------+ CFV      Full           Yes      Yes                                 +---------+---------------+---------+-----------+----------+--------------+ SFJ      Full                                                         +---------+---------------+---------+-----------+----------+--------------+ FV Prox  Full                                                        +---------+---------------+---------+-----------+----------+--------------+ FV Mid   Full                                                        +---------+---------------+---------+-----------+----------+--------------+ FV DistalFull                                                        +---------+---------------+---------+-----------+----------+--------------+ PFV      Full                                                        +---------+---------------+---------+-----------+----------+--------------+ POP      Full           Yes      Yes                                 +---------+---------------+---------+-----------+----------+--------------+ PTV  Full                                                        +---------+---------------+---------+-----------+----------+--------------+ PERO                                                  Not visualized +---------+---------------+---------+-----------+----------+--------------+   +---------+---------------+---------+-----------+----------+--------------+ LEFT     CompressibilityPhasicitySpontaneityPropertiesThrombus Aging +---------+---------------+---------+-----------+----------+--------------+ CFV      Full           Yes      Yes                                 +---------+---------------+---------+-----------+----------+--------------+ SFJ      Full                                                        +---------+---------------+---------+-----------+----------+--------------+ FV Prox  Full                                                        +---------+---------------+---------+-----------+----------+--------------+ FV Mid   Full                                                         +---------+---------------+---------+-----------+----------+--------------+ FV DistalFull                                                        +---------+---------------+---------+-----------+----------+--------------+ PFV      Full                                                        +---------+---------------+---------+-----------+----------+--------------+ POP      Full           Yes      Yes                                 +---------+---------------+---------+-----------+----------+--------------+ PTV      Full                                                        +---------+---------------+---------+-----------+----------+--------------+  PERO                                                  Not visualized +---------+---------------+---------+-----------+----------+--------------+     Summary: BILATERAL: - No evidence of deep vein thrombosis seen in the lower extremities, bilaterally.   *See table(s) above for measurements and observations. Electronically signed by Curt Jews MD on 05/03/2019 at 4:26:06 PM.    Final    ECHOCARDIOGRAM LIMITED  Result Date: 05/07/2019    ECHOCARDIOGRAM LIMITED REPORT   Patient Name:   Erica [name] Date of Exam: 05/07/2019 Medical Rec #:  [id number]    Height:       65.0 in Accession #:    [id number]   Weight:       238.1 lb Date of Birth:  04/23/1970    BSA:          2.130 m Patient Age:    11 years     BP:           112/61 mmHg Patient Gender: F            HR:           84 bpm. Exam Location:  Inpatient Procedure: Limited Echo, Cardiac Doppler and Color Doppler Indications:    CHF  History:        Patient has no prior history of Echocardiogram examinations.                 Signs/Symptoms:Shortness of Breath. COVID POSITIVE, pneumonia,                 Obesity.  Sonographer:    Dustin Flock Referring Phys: Waverly Grayslake  1. Left ventricular ejection fraction, by estimation, is 65 to 70%. The left ventricle has normal  function. The left ventricle has no regional wall motion abnormalities. Left ventricular diastolic parameters are consistent with Grade I diastolic dysfunction (impaired relaxation).  2. Right ventricular systolic function is normal. The right ventricular size is normal.  3. The mitral valve is normal in structure. No evidence of mitral valve regurgitation. No evidence of mitral stenosis.  4. The aortic valve is normal in structure. Aortic valve regurgitation is not visualized. No aortic stenosis is present.  5. The inferior vena cava is normal in size with greater than 50% respiratory variability, suggesting right atrial pressure of 3 mmHg. FINDINGS  Left Ventricle: Left ventricular ejection fraction, by estimation, is 65 to 70%. The left ventricle has normal function. The left ventricle has no regional wall motion abnormalities. The left ventricular internal cavity size was normal in size. There is  no left ventricular hypertrophy. Right Ventricle: The right ventricular size is normal. No increase in right ventricular wall thickness. Right ventricular systolic function is normal. Left Atrium: Left atrial size was normal in size. Right Atrium: Right atrial size was normal in size. Pericardium: There is no evidence of pericardial effusion. Mitral Valve: The mitral valve is normal in structure. Normal mobility of the mitral valve leaflets. No evidence of mitral valve stenosis. Tricuspid Valve: The tricuspid valve is normal in structure. Tricuspid valve regurgitation is not demonstrated. No evidence of tricuspid stenosis. Aortic Valve: The aortic valve is normal in structure. Aortic valve regurgitation is not visualized. No aortic stenosis is present. Pulmonic Valve: The pulmonic valve was normal in structure. Pulmonic valve  regurgitation is not visualized. No evidence of pulmonic stenosis. Aorta: The aortic root is normal in size and structure. Venous: The inferior vena cava is normal in size with greater than 50%  respiratory variability, suggesting right atrial pressure of 3 mmHg. IAS/Shunts: No atrial level shunt detected by color flow Doppler.  LEFT VENTRICLE PLAX 2D LVIDd:         4.00 cm  Diastology LVIDs:         2.55 cm  LV e' lateral:   7.40 cm/s LV PW:         0.97 cm  LV E/e' lateral: 7.2 LV IVS:        1.03 cm  LV e' medial:    5.33 cm/s LVOT diam:     1.70 cm  LV E/e' medial:  10.0 LVOT Area:     2.27 cm  RIGHT VENTRICLE RV S prime:     5.11 cm/s LEFT ATRIUM         Index LA diam:    2.80 cm 1.31 cm/m   AORTA Ao Root diam: 2.10 cm MITRAL VALVE MV Area (PHT): 3.48 cm    SHUNTS MV Decel Time: 218 msec    Systemic Diam: 1.70 cm MV E velocity: 53.10 cm/s MV A velocity: 63.00 cm/s MV E/A ratio:  0.84 Candee Furbish MD Electronically signed by Candee Furbish MD Signature Date/Time: 05/07/2019/3:18:45 PM    Final

## 2019-05-09 NOTE — Progress Notes (Signed)
  Speech Language Pathology Treatment: Dysphagia  Patient Details Name: LENORE MOYANO MRN: [id number] DOB: January 01, 1971 Today's Date: 05/09/2019 Time: 1011-1030 SLP Time Calculation (min) (ACUTE ONLY): 19 min  Assessment / Plan / Recommendation Clinical Impression  Pt seen at bedside for skilled ST intervention targeting goals for diet tolerance and education. Pt presents with hoarse voice, and requests hot tea with honey and lemon. This was added to her diet order to facilitate receiving it with each meal. Pt was also encouraged to ask nursing for hot water and tea bags between meals if needed. Pt reports no difficulty swallowing, and does not exhibit overt s/s aspiration. ST available if needs arise. Current goals met - signing of at this time.   HPI HPI: 49 year old female admitted 04/28/19 with increasing SOB. Patient's symptoms began 04/20/19 with feeling tired and tested positive for COVID on 04/21/19. She started having cough, loss of taste, muscle aches, intermittent low-grade fevers. Oxygen saturation 81% at [facility] infusion clinic on 04/28/19. CXR: bilateral multifocal infiltrates. Patient admitted to the hospital with acute hypoxic respiratory failure due to COVID PNA requiring 6L [place]. No significant PMH.       SLP Plan  All goals met       Recommendations  Diet recommendations: Regular;Thin liquid Liquids provided via: Cup;Straw Medication Administration: Whole meds with liquid Supervision: Patient able to self feed Compensations: Slow rate;Small sips/bites Postural Changes and/or Swallow Maneuvers: Seated upright 90 degrees                Oral Care Recommendations: Oral care BID Follow up Recommendations: None SLP Visit Diagnosis: Dysphagia, unspecified (R13.10) Plan: All goals met       GO                B. Quentin Ore, [facility], [place] Speech Language Pathologist Office: [phone] Pager: [phone]  Shonna Chock 05/09/2019, 11:13 AM

## 2019-05-10 LAB — CBC WITH DIFFERENTIAL/PLATELET
Abs Immature Granulocytes: 0.13 10*3/uL — ABNORMAL HIGH (ref 0.00–0.07)
Basophils Absolute: 0.1 10*3/uL (ref 0.0–0.1)
Basophils Relative: 1 %
Eosinophils Absolute: 0.1 10*3/uL (ref 0.0–0.5)
Eosinophils Relative: 2 %
HCT: 40.5 % (ref 36.0–46.0)
Hemoglobin: 13.4 g/dL (ref 12.0–15.0)
Immature Granulocytes: 2 %
Lymphocytes Relative: 32 %
Lymphs Abs: 1.8 10*3/uL (ref 0.7–4.0)
MCH: 30.3 pg (ref 26.0–34.0)
MCHC: 33.1 g/dL (ref 30.0–36.0)
MCV: 91.6 fL (ref 80.0–100.0)
Monocytes Absolute: 0.6 10*3/uL (ref 0.1–1.0)
Monocytes Relative: 11 %
Neutro Abs: 3 10*3/uL (ref 1.7–7.7)
Neutrophils Relative %: 52 %
Platelets: 176 10*3/uL (ref 150–400)
RBC: 4.42 MIL/uL (ref 3.87–5.11)
RDW: 14.3 % (ref 11.5–15.5)
WBC: 5.8 10*3/uL (ref 4.0–10.5)
nRBC: 0.9 % — ABNORMAL HIGH (ref 0.0–0.2)

## 2019-05-10 LAB — COMPREHENSIVE METABOLIC PANEL
ALT: 51 U/L — ABNORMAL HIGH (ref 0–44)
AST: 43 U/L — ABNORMAL HIGH (ref 15–41)
Albumin: 2.6 g/dL — ABNORMAL LOW (ref 3.5–5.0)
Alkaline Phosphatase: 60 U/L (ref 38–126)
Anion gap: 10 (ref 5–15)
BUN: 6 mg/dL (ref 6–20)
CO2: 23 mmol/L (ref 22–32)
Calcium: 8.3 mg/dL — ABNORMAL LOW (ref 8.9–10.3)
Chloride: 108 mmol/L (ref 98–111)
Creatinine, Ser: 0.91 mg/dL (ref 0.44–1.00)
GFR calc Af Amer: >60 mL/min (ref >60)
GFR calc non Af Amer: >60 mL/min (ref >60)
Glucose, Bld: 102 mg/dL — ABNORMAL HIGH (ref 70–99)
Potassium: 3.8 mmol/L (ref 3.5–5.1)
Sodium: 141 mmol/L (ref 135–145)
Total Bilirubin: 0.7 mg/dL (ref 0.3–1.2)
Total Protein: 5.5 g/dL — ABNORMAL LOW (ref 6.5–8.1)

## 2019-05-10 LAB — D-DIMER, QUANTITATIVE: D-Dimer, Quant: 7.85 ug/mL-FEU — ABNORMAL HIGH (ref 0.00–0.50)

## 2019-05-10 LAB — GLUCOSE, CAPILLARY
Glucose-Capillary: 100 mg/dL — ABNORMAL HIGH (ref 70–99)
Glucose-Capillary: 104 mg/dL — ABNORMAL HIGH (ref 70–99)
Glucose-Capillary: 102 mg/dL — ABNORMAL HIGH (ref 70–99)
Glucose-Capillary: 101 mg/dL — ABNORMAL HIGH (ref 70–99)

## 2019-05-10 LAB — C-REACTIVE PROTEIN: CRP: 0.6 mg/dL (ref <1.0)

## 2019-05-10 LAB — MAGNESIUM: Magnesium: 2.2 mg/dL (ref 1.7–2.4)

## 2019-05-10 NOTE — Progress Notes (Addendum)
PROGRESS NOTE                                                                                                                                                                                                             Patient Demographics:    Erica [name], is a 49 y.o. female, DOB - 1970/12/22, VXU:[id number]  Outpatient Primary MD for the patient is Alroy Dust, L.Marlou Sa, MD    LOS - 12  Admit date - 04/28/2019    Chief Complaint  Patient presents with  . COVID  . Shortness of Breath       Brief Narrative    - Erica [name] is a 49 y.o. female with no significant past medical history presents the emergency department today with increasing short of breath.  Her symptoms started on 04/20/2019, initially was only feeling tired but no short of breath.  She had recently tested positive on 04/21/2019 for COVID-19 pneumonia and was placed on oral steroid treatment without much benefit, came to the ER with shortness of breath and was diagnosed with acute hypoxic respiratory failure due to COVID-19 pneumonia and admitted to the hospital with 6 L nasal cannula oxygen requirement.  She was treated promptly with IV steroids, remdesivir and Actemra, unfortunately her course was complicated by development of pneumomediastinum and small pneumothorax which has contained, she has developed aspiration bacterial pneumonia for which ID is following the patient.  She has 1 out of 4 blood cultures positive for MSSA bacteremia.   Subjective:   Patient in bed, appears comfortable, denies any headache, no fever, no chest pain or pressure, she still reporting cough, with productive yellow phlegm.   Assessment  & Plan :    Acute Hypoxic Resp. Failure due to Acute Covid 19 Viral Pneumonitis during the ongoing 2020 Covid 19 Pandemic  - she has severe disease and was treated promptly after appropriate consents with IV Actemra on 04/29/2019 along with steroids and  Remdesivir, she has shown slow but definite improvement, currently room air at rest, down from 15L/ minute. -Unfortunately she has developed a small pneumothorax and pneumomediastinum which will be monitored, also signs of possible aspiration pneumonia .  Encouraged the patient to sit up in chair in the daytime use I-S and flutter valve for pulmonary toiletry and then prone in bed when at night.  Will advance activity  and titrate down oxygen as possible.  SpO2: 100 % O2 Flow Rate (L/min): 2 L/min   Recent Labs  Lab 05/04/19 0502 05/04/19 0502 05/05/19 0719 05/05/19 0719 05/06/19 0655 05/07/19 0517 05/08/19 0530 05/09/19 0740 05/10/19 0306  CRP 0.5   < > <0.5   < > <0.5 0.5 0.6 0.6 0.6  DDIMER 2.47*   < > 2.88*   < > 3.42* 3.75* 5.30* 7.56* 7.85*  PROCALCITON 0.22  --  0.16  --  0.24 0.50 0.23  --   --    < > = values in this interval not displayed.    Hepatic Function Latest Ref Rng & Units 05/10/2019 05/09/2019 05/08/2019  Total Protein 6.5 - 8.1 g/dL 5.5(L) 5.3(L) 5.0(L)  Albumin 3.5 - 5.0 g/dL 2.6(L) 2.6(L) 2.5(L)  AST 15 - 41 U/L 43(H) 43(H) 32  ALT 0 - 44 U/L 51(H) 48(H) 45(H)  Alk Phosphatase 38 - 126 U/L 60 64 70  Total Bilirubin 0.3 - 1.2 mg/dL 0.7 0.6 0.9  Bilirubin, Direct 0.0 - 0.2 mg/dL - - -   MSSA bacteremia - 1 set of blood cultures was positive . - repeat blood cultures the following day remain negative.   - There is no evidence of endocarditis on TTE.   -Antibiotics management per ID, recommendation for 2 more days of IV cefazolin and then 1 dose of long-acting oritavancin.                                 History of shingles.   - Currently stable does not take Valtrex anymore at home on a scheduled basis.   Borderline elevated D-dimer.   -She has elevated D-dimer despite being on moderately high dose of Lovenox, lower extremity ultrasound negative, patient does have exposure to hormonal contraceptive for which I will place her on 1 month of prophylactic dose  Eliquis upon discharge.  Her risk for developing a clot remains very high.  D-dimers continue trending up, will continue to monitor closely, if continues to increase will consider CTA chest to R/O PE.  Obesity.  BMI 37.  Follow with PCP.  Superimposed aspiration bacterial pneumonia.  -  Patient strictly counseled multiple times to have all meals in the chair, she tends to eat in bed laying flat, cleared by speech, 1/4 bottles growing MSSA, was initially on Unasyn but now switched to cefazolin IV, stable echocardiogram, procalcitonin is stable, CT was discussed with pulmonary, ID following.  Note patient also received Actemra for her severe COVID-19 infection which does predispose her to bacterial infections.  ID following defer course of antibiotic treatment to ID at this point.    Condition - Extremely Guarded  Family Communication  : D/W patient  Code Status : Full  Diet :   Diet Order            Diet regular Room service appropriate? Yes with Assist; Fluid consistency: Thin  Diet effective now               Disposition Plan  : Stay inpatient in the hospital for treatment of severe acute hypoxic respiratory failure due to COVID-19 pneumonia and bacterial aspiration pneumonia.  Finish treatment course, thereafter discharge home.  Consults  : CT  discussed with pulmonary on 05/06/2019. ID  Procedures  :    TTE -   1. Left ventricular ejection fraction, by estimation, is 65 to 70%. The left ventricle has normal  function. The left ventricle has no regional wall motion abnormalities. Left ventricular diastolic parameters are consistent with Grade I diastolic dysfunction (impaired relaxation).  2. Right ventricular systolic function is normal. The right ventricular size is normal.  3. The mitral valve is normal in structure. No evidence of mitral valve regurgitation. No evidence of mitral stenosis.  4. The aortic valve is normal in structure. Aortic valve regurgitation is not  visualized. No aortic stenosis is present.  5. The inferior vena cava is normal in size with greater than 50% respiratory variability, suggesting right atrial pressure of 3 mmHg.  Leg [place] - No DVT  CT - Extensive BILATERAL patchy airspace infiltrates consistent with multifocal pneumonia. Multiple cavitary foci are identified in both lungs, greater in the lower lobes; this is uncommon with COVID-19 alone, suggesting a superimposed cavitary pneumonia or sequela of aspiration pneumonia. Pneumomediastinum with gas extending into the cervical regions and less into the axilla as above; this is of uncertain etiology, could be related to barotrauma, coughing, mechanical ventilation.   PUD Prophylaxis : None  DVT Prophylaxis  :  Lovenox    Lab Results  Component Value Date   PLT 176 05/10/2019    Inpatient Medications  Scheduled Meds: . albuterol  2 puff Inhalation Q6H  . Chlorhexidine Gluconate Cloth  6 each Topical Q0600  . enoxaparin (LOVENOX) injection  60 mg Subcutaneous Q12H  . insulin aspart  0-15 Units Subcutaneous TID WC  . insulin aspart  0-5 Units Subcutaneous QHS  . insulin glargine  15 Units Subcutaneous Daily  . mouth rinse  15 mL Mouth Rinse BID  . multivitamin with minerals  1 tablet Oral Daily  . mupirocin ointment  1 application Nasal BID   Continuous Infusions: .  ceFAZolin (ANCEF) IV 2 g (05/10/19 1434)  . [START ON 05/13/2019] oritavancin (ORBACTIV) IVPB     PRN Meds:.acetaminophen, guaiFENesin-dextromethorphan, menthol-cetylpyridinium, valACYclovir  Antibiotics  :    Anti-infectives (From admission, onward)   Start     Dose/Rate Route Frequency Ordered Stop   05/13/19 1200  Oritavancin Diphosphate (ORBACTIV) 1,200 mg in dextrose 5 % IVPB     1,200 mg 333.3 mL/hr over 180 Minutes Intravenous Once 05/09/19 1126     05/07/19 1400  ceFAZolin (ANCEF) IVPB 2g/100 mL premix     2 g 200 mL/hr over 30 Minutes Intravenous Every 8 hours 05/07/19 1244 05/13/19 2359    05/06/19 1200  Ampicillin-Sulbactam (UNASYN) 3 g in sodium chloride 0.9 % 100 mL IVPB  Status:  Discontinued     3 g 200 mL/hr over 30 Minutes Intravenous Every 8 hours 05/06/19 1029 05/07/19 1445   04/30/19 1000  remdesivir 100 mg in sodium chloride 0.9 % 100 mL IVPB     100 mg 200 mL/hr over 30 Minutes Intravenous Daily 04/29/19 0018 05/03/19 0832   04/29/19 1034  valACYclovir (VALTREX) tablet 500 mg    Note to Pharmacy: Patient taking differently: Take one tablet (500 mg) by mouth twice daily for one week - as needed for breakouts     500 mg Oral 2 times daily between meals and at bedtime PRN 04/29/19 0950     04/29/19 1000  remdesivir 100 mg in sodium chloride 0.9 % 100 mL IVPB  Status:  Discontinued     100 mg 200 mL/hr over 30 Minutes Intravenous Daily 04/28/19 1736 04/29/19 0018   04/29/19 0100  remdesivir 100 mg in sodium chloride 0.9 % 100 mL IVPB     100 mg 200  mL/hr over 30 Minutes Intravenous Every 30 min 04/29/19 0018 04/29/19 0700   04/28/19 1745  remdesivir 200 mg in sodium chloride 0.9% 250 mL IVPB  Status:  Discontinued     200 mg 580 mL/hr over 30 Minutes Intravenous Once 04/28/19 1736 04/29/19 0018       Dyer Klug M.D on 05/10/2019 at 2:58 PM  To page go to [email or web address] - password [facility]  [facility] -  Office  [phone]    See all Orders from today for further details    Objective:   Vitals:   05/10/19 0510 05/10/19 0603 05/10/19 0903 05/10/19 1454  BP: 109/77   125/90  Pulse: 91   88  Resp: 18   19  Temp: 99.7 F (37.6 C) 98.3 F (36.8 C)  98.7 F (37.1 C)  TempSrc: Oral Oral  Oral  SpO2: 93%  95% 100%  Weight:      Height:        Wt Readings from Last 3 Encounters:  05/02/19 108 kg  01/24/19 106.1 kg  04/29/11 96.6 kg     Intake/Output Summary (Last 24 hours) at 05/10/2019 1458 Last data filed at 05/10/2019 0512 Gross per 24 hour  Intake 240 ml  Output --  Net 240 ml     Physical Exam  Awake Alert, Oriented X 3, No new  F.N deficits, Normal affect Symmetrical Chest wall movement, Good air movement bilaterally, CTAB RRR,No Gallops,Rubs or new Murmurs, No Parasternal Heave +ve B.Sounds, Abd Soft, No tenderness, No rebound - guarding or rigidity. No Cyanosis, Clubbing or edema, No new Rash or bruise      Data Review:    CBC Recent Labs  Lab 05/06/19 0655 05/07/19 0517 05/08/19 0530 05/09/19 0740 05/10/19 0306  WBC 23.4* 25.7* 16.3* 9.5 5.8  HGB 15.1* 13.8 13.3 13.5 13.4  HCT 46.0 42.3 39.8 40.7 40.5  PLT 332 264 234 200 176  MCV 90.4 92.2 91.5 92.3 91.6  MCH 29.7 30.1 30.6 30.6 30.3  MCHC 32.8 32.6 33.4 33.2 33.1  RDW 14.0 14.2 14.2 14.1 14.3  LYMPHSABS 2.0 2.8 2.8 1.6 1.8  MONOABS 0.8 1.0 0.8 0.7 0.6  EOSABS 0.2 0.3 0.1 0.1 0.1  BASOSABS 0.1 0.0 0.1 0.1 0.1    Chemistries  Recent Labs  Lab 05/05/19 0719 05/05/19 0719 05/06/19 0655 05/07/19 0517 05/08/19 0530 05/09/19 0740 05/10/19 0306  NA 139   < > 139 141 141 141 141  K 4.0   < > 3.9 3.8 3.6 3.5 3.8  CL 102   < > 104 106 106 109 108  CO2 24   < > '24 25 24 23 23  '$ GLUCOSE 132*   < > 121* 109* 113* 112* 102*  BUN 17   < > '14 9 9 '$ 5* 6  CREATININE 1.08*   < > 1.07* 1.04* 0.96 0.99 0.91  CALCIUM 8.2*   < > 8.2* 7.8* 8.1* 8.0* 8.3*  AST 68*  --   --  38 32 43* 43*  ALT 79*  --   --  51* 45* 48* 51*  ALKPHOS 80  --   --  82 70 64 60  BILITOT 0.9  --   --  1.0 0.9 0.6 0.7  MG  --   --   --  2.2 2.2 2.1 2.2   < > = values in this interval not displayed.     ------------------------------------------------------------------------------------------------------------------ No results for input(s): CHOL, HDL, LDLCALC, TRIG, CHOLHDL, LDLDIRECT in the last  72 hours.  Lab Results  Component Value Date   HGBA1C 6.2 (H) 04/28/2019   ------------------------------------------------------------------------------------------------------------------ No results for input(s): TSH, T4TOTAL, T3FREE, THYROIDAB in the last 72 hours.  Invalid  input(s): FREET3  Cardiac Enzymes No results for input(s): CKMB, TROPONINI, MYOGLOBIN in the last 168 hours.  Invalid input(s): CK ------------------------------------------------------------------------------------------------------------------    Component Value Date/Time   BNP 45.8 05/03/2019 0344    Micro Results Recent Results (from the past 240 hour(s))  Culture, blood (routine x 2)     Status: None (Preliminary result)   Collection Time: 05/06/19 10:44 AM   Specimen: BLOOD  Result Value Ref Range Status   Specimen Description BLOOD SITE NOT SPECIFIED  Final   Special Requests   Final    BOTTLES DRAWN AEROBIC ONLY Blood Culture results may not be optimal due to an inadequate volume of blood received in culture bottles   Culture   Final    NO GROWTH 4 DAYS Performed at [facility], [place] [address], [place], [place] [address]    Report Status PENDING  Incomplete  Culture, blood (routine x 2)     Status: Abnormal   Collection Time: 05/06/19 10:47 AM   Specimen: BLOOD RIGHT ARM  Result Value Ref Range Status   Specimen Description BLOOD RIGHT ARM  Final   Special Requests   Final    BOTTLES DRAWN AEROBIC AND ANAEROBIC Blood Culture adequate volume   Culture  Setup Time   Final    GRAM POSITIVE COCCI IN CLUSTERS ANAEROBIC BOTTLE ONLY CRITICAL RESULT CALLED TO, READ BACK BY AND VERIFIED WITH: TJaneice Robinson PHARMD, AT 1059 05/07/19 BY D. VANHOOK Performed at [facility], [place] [address], [place],  [address]    Culture STAPHYLOCOCCUS AUREUS (A)  Final   Report Status 05/09/2019 FINAL  Final   Organism ID, Bacteria STAPHYLOCOCCUS AUREUS  Final      Susceptibility   Staphylococcus aureus - MIC*    CIPROFLOXACIN <=0.5 SENSITIVE Sensitive     ERYTHROMYCIN <=0.25 SENSITIVE Sensitive     GENTAMICIN <=0.5 SENSITIVE Sensitive     OXACILLIN <=0.25 SENSITIVE Sensitive     TETRACYCLINE <=1 SENSITIVE Sensitive     VANCOMYCIN 1 SENSITIVE Sensitive     TRIMETH/SULFA  <=10 SENSITIVE Sensitive     CLINDAMYCIN <=0.25 SENSITIVE Sensitive     RIFAMPIN <=0.5 SENSITIVE Sensitive     Inducible Clindamycin NEGATIVE Sensitive     * STAPHYLOCOCCUS AUREUS  Blood Culture ID Panel (Reflexed)     Status: Abnormal   Collection Time: 05/06/19 10:47 AM  Result Value Ref Range Status   Enterococcus species NOT DETECTED NOT DETECTED Final   Listeria monocytogenes NOT DETECTED NOT DETECTED Final   Staphylococcus species DETECTED (A) NOT DETECTED Final    Comment: CRITICAL RESULT CALLED TO, READ BACK BY AND VERIFIED WITH: T. BAUMEISTER PHARMD, AT 1059 05/07/19 BY D. VANHOOK    Staphylococcus aureus (BCID) DETECTED (A) NOT DETECTED Final    Comment: Methicillin (oxacillin) susceptible Staphylococcus aureus (MSSA). Preferred therapy is anti staphylococcal beta lactam antibiotic (Cefazolin or Nafcillin), unless clinically contraindicated. CRITICAL RESULT CALLED TO, READ BACK BY AND VERIFIED WITH: T. BAUMEISTER PHARMD, AT 1059 05/07/19 BY D. VANHOOK    Methicillin resistance NOT DETECTED NOT DETECTED Final   Streptococcus species NOT DETECTED NOT DETECTED Final   Streptococcus agalactiae NOT DETECTED NOT DETECTED Final   Streptococcus pneumoniae NOT DETECTED NOT DETECTED Final   Streptococcus pyogenes NOT DETECTED NOT DETECTED Final  Acinetobacter baumannii NOT DETECTED NOT DETECTED Final   Enterobacteriaceae species NOT DETECTED NOT DETECTED Final   Enterobacter cloacae complex NOT DETECTED NOT DETECTED Final   Escherichia coli NOT DETECTED NOT DETECTED Final   Klebsiella oxytoca NOT DETECTED NOT DETECTED Final   Klebsiella pneumoniae NOT DETECTED NOT DETECTED Final   Proteus species NOT DETECTED NOT DETECTED Final   Serratia marcescens NOT DETECTED NOT DETECTED Final   Haemophilus influenzae NOT DETECTED NOT DETECTED Final   Neisseria meningitidis NOT DETECTED NOT DETECTED Final   Pseudomonas aeruginosa NOT DETECTED NOT DETECTED Final   Candida albicans NOT DETECTED NOT  DETECTED Final   Candida glabrata NOT DETECTED NOT DETECTED Final   Candida krusei NOT DETECTED NOT DETECTED Final   Candida parapsilosis NOT DETECTED NOT DETECTED Final   Candida tropicalis NOT DETECTED NOT DETECTED Final    Comment: Performed at [facility], [place] [address], [place], [place] [address]  MRSA PCR Screening     Status: Abnormal   Collection Time: 05/06/19 10:59 AM   Specimen: Nasal Mucosa; Nasopharyngeal  Result Value Ref Range Status   MRSA by PCR POSITIVE (A) NEGATIVE Final    Comment:        The GeneXpert MRSA Assay (FDA approved for NASAL specimens only), is one component of a comprehensive MRSA colonization surveillance program. It is not intended to diagnose MRSA infection nor to guide or monitor treatment for MRSA infections. CRITICAL RESULT CALLED TO, READ BACK BY AND VERIFIED WITH: RN Salley Slaughter [id number] AT 1414 BY CM Performed at [facility], [place] [address] [place], [place] [address]   Expectorated sputum assessment w rflx to resp cult     Status: None   Collection Time: 05/06/19  1:00 PM   Specimen: Expectorated Sputum  Result Value Ref Range Status   Specimen Description Expect. Sput  Final   Special Requests Normal  Final   Sputum evaluation   Final    THIS SPECIMEN IS ACCEPTABLE FOR SPUTUM CULTURE Performed at [facility], [place] [address], [place], [place] [address]    Report Status 05/06/2019 FINAL  Final  Culture, respiratory     Status: None   Collection Time: 05/06/19  1:00 PM  Result Value Ref Range Status   Specimen Description Expect. Sput  Final   Special Requests Normal Reflexed from [id number]  Final   Gram Stain   Final    RARE WBC PRESENT, PREDOMINANTLY PMN FEW GRAM POSITIVE COCCI IN PAIRS FEW GRAM NEGATIVE RODS FEW GRAM POSITIVE RODS    Culture   Final    ABUNDANT Consistent with normal respiratory flora. Performed at [facility], [place] [address] [place], [place] [address]    Report Status 05/08/2019  FINAL  Final  Culture, blood (routine x 2)     Status: None (Preliminary result)   Collection Time: 05/07/19  9:02 PM   Specimen: BLOOD RIGHT ARM  Result Value Ref Range Status   Specimen Description BLOOD RIGHT ARM  Final   Special Requests   Final    BOTTLES DRAWN AEROBIC ONLY Blood Culture results may not be optimal due to an inadequate volume of blood received in culture bottles   Culture   Final    NO GROWTH 3 DAYS Performed at [facility], [place] [address] [place], [place] [address]    Report Status PENDING  Incomplete  Culture, blood (routine x 2)     Status: None (Preliminary result)  Collection Time: 05/07/19  9:02 PM   Specimen: BLOOD RIGHT ARM  Result Value Ref Range Status   Specimen Description BLOOD RIGHT ARM  Final   Special Requests   Final    BOTTLES DRAWN AEROBIC ONLY Blood Culture adequate volume   Culture   Final    NO GROWTH 3 DAYS Performed at [facility], [address], [place], [place] [address]    Report Status PENDING  Incomplete    Radiology Reports CT CHEST WO CONTRAST  Result Date: 05/06/2019 CLINICAL DATA:  Chest pain, shortness of breath, pneumomediastinum, acute hypoxic respiratory failure due to COVID-19 viral pneumonia EXAM: CT CHEST WITHOUT CONTRAST TECHNIQUE: Multidetector CT imaging of the chest was performed following the standard protocol without IV contrast. Sagittal and coronal MPR images reconstructed from axial data set. COMPARISON:  None FINDINGS: Cardiovascular: Aorta normal caliber. Heart size normal. No pericardial effusion. Mediastinum/Nodes: Small hiatal hernia. Scattered pneumomediastinum few normal size mediastinal lymph nodes without thoracic adenopathy. Additional soft tissue gas is seen extending into the axilla and cervical regions bilaterally. Lungs/Pleura: Patchy airspace infiltrates throughout both lungs consistent with multifocal pneumonia and history of COVID-19. Multiple small cavitary foci are identified within  the infiltrates in both lungs, greater in lower lobes. No pleural effusion or pneumothorax. No discrete pulmonary mass/nodule. Upper Abdomen: Visualized upper abdomen unremarkable Musculoskeletal: Osseous structures unremarkable. IMPRESSION: Extensive BILATERAL patchy airspace infiltrates consistent with multifocal pneumonia. Multiple cavitary foci are identified in both lungs, greater in the lower lobes; this is uncommon with COVID-19 alone, suggesting a superimposed cavitary pneumonia or sequela of aspiration pneumonia. Pneumomediastinum with gas extending into the cervical regions and less into the axilla as above; this is of uncertain etiology, could be related to barotrauma, coughing, mechanical ventilation. Electronically Signed   By: Lavonia Dana M.D.   On: 05/06/2019 10:11   DG Chest Port 1 View  Result Date: 05/05/2019 CLINICAL DATA:  Increasing shortness of breath. EXAM: PORTABLE CHEST 1 VIEW COMPARISON:  Chest radiograph 05/03/2019 FINDINGS: There is pneumomediastinum and subcutaneous emphysema throughout the soft tissues of the visualized neck base. There are worsening bilateral diffuse airspace opacities. There is more focal consolidation in the right peripheral lung. No significant pleural effusion. Query tiny right apical pneumothorax. IMPRESSION: 1. Pneumomediastinum and subcutaneous emphysema throughout the soft tissues of the visualized neck base. 2. Interval worsening of bilateral diffuse airspace opacities. 3. Query tiny right apical pneumothorax. Electronically Signed   By: Audie Pinto M.D.   On: 05/05/2019 16:31   DG Chest Port 1 View  Result Date: 05/03/2019 CLINICAL DATA:  Shortness of breath, COVID EXAM: PORTABLE CHEST 1 VIEW COMPARISON:  04/28/2019 FINDINGS: Cardiomegaly. There is interval increase in heterogeneous bilateral, bibasilar predominant airspace opacity. IMPRESSION: Interval increase in heterogeneous bilateral, bibasilar predominant airspace opacity, consistent with  worsened multifocal infection and in keeping with COVID airspace disease. Electronically Signed   By: Eddie Candle M.D.   On: 05/03/2019 10:51   DG Chest Port 1 View  Result Date: 04/28/2019 CLINICAL DATA:  COVID-19 positive, short of breath, hypoxia EXAM: PORTABLE CHEST 1 VIEW COMPARISON:  None. FINDINGS: Single frontal view of the chest demonstrates an unremarkable cardiac silhouette. Diffuse interstitial prominence with patchy bilateral ground-glass opacities greatest at the lung bases. No effusion or pneumothorax. No acute bony abnormalities. IMPRESSION: 1. Multifocal pneumonia compatible with COVID-19.  The Electronically Signed   By: Randa Ngo M.D.   On: 04/28/2019 16:47   VAS [place] LOWER EXTREMITY VENOUS (DVT)  Result Date:  05/03/2019  Lower Venous DVTStudy Indications: Edema, and Swelling.  Comparison Study: no prior Performing Technologist: Abram Sander RVS  Examination Guidelines: A complete evaluation includes B-mode imaging, spectral Doppler, color Doppler, and power Doppler as needed of all accessible portions of each vessel. Bilateral testing is considered an integral part of a complete examination. Limited examinations for reoccurring indications may be performed as noted. The reflux portion of the exam is performed with the patient in reverse Trendelenburg.  +---------+---------------+---------+-----------+----------+--------------+ RIGHT    CompressibilityPhasicitySpontaneityPropertiesThrombus Aging +---------+---------------+---------+-----------+----------+--------------+ CFV      Full           Yes      Yes                                 +---------+---------------+---------+-----------+----------+--------------+ SFJ      Full                                                        +---------+---------------+---------+-----------+----------+--------------+ FV Prox  Full                                                         +---------+---------------+---------+-----------+----------+--------------+ FV Mid   Full                                                        +---------+---------------+---------+-----------+----------+--------------+ FV DistalFull                                                        +---------+---------------+---------+-----------+----------+--------------+ PFV      Full                                                        +---------+---------------+---------+-----------+----------+--------------+ POP      Full           Yes      Yes                                 +---------+---------------+---------+-----------+----------+--------------+ PTV      Full                                                        +---------+---------------+---------+-----------+----------+--------------+ PERO  Not visualized +---------+---------------+---------+-----------+----------+--------------+   +---------+---------------+---------+-----------+----------+--------------+ LEFT     CompressibilityPhasicitySpontaneityPropertiesThrombus Aging +---------+---------------+---------+-----------+----------+--------------+ CFV      Full           Yes      Yes                                 +---------+---------------+---------+-----------+----------+--------------+ SFJ      Full                                                        +---------+---------------+---------+-----------+----------+--------------+ FV Prox  Full                                                        +---------+---------------+---------+-----------+----------+--------------+ FV Mid   Full                                                        +---------+---------------+---------+-----------+----------+--------------+ FV DistalFull                                                         +---------+---------------+---------+-----------+----------+--------------+ PFV      Full                                                        +---------+---------------+---------+-----------+----------+--------------+ POP      Full           Yes      Yes                                 +---------+---------------+---------+-----------+----------+--------------+ PTV      Full                                                        +---------+---------------+---------+-----------+----------+--------------+ PERO                                                  Not visualized +---------+---------------+---------+-----------+----------+--------------+     Summary: BILATERAL: - No evidence of deep vein thrombosis seen in the lower extremities, bilaterally.   *See table(s) above for measurements and observations. Electronically signed by Curt Jews MD on 05/03/2019 at 4:26:06 PM.    Final    ECHOCARDIOGRAM LIMITED  Result  Date: 05/07/2019    ECHOCARDIOGRAM LIMITED REPORT   Patient Name:   Eunice JAMONI BROADFOOT Date of Exam: 05/07/2019 Medical Rec #:  [id number]    Height:       65.0 in Accession #:    [id number]   Weight:       238.1 lb Date of Birth:  [birth year]    BSA:          2.130 m Patient Age:    89 years     BP:           112/61 mmHg Patient Gender: F            HR:           84 bpm. Exam Location:  Inpatient Procedure: Limited Echo, Cardiac Doppler and Color Doppler Indications:    CHF  History:        Patient has no prior history of Echocardiogram examinations.                 Signs/Symptoms:Shortness of Breath. COVID POSITIVE, pneumonia,                 Obesity.  Sonographer:    Dustin Flock Referring Phys: [place]  1. Left ventricular ejection fraction, by estimation, is 65 to 70%. The left ventricle has normal function. The left ventricle has no regional wall motion abnormalities. Left ventricular diastolic parameters are consistent with Grade I diastolic  dysfunction (impaired relaxation).  2. Right ventricular systolic function is normal. The right ventricular size is normal.  3. The mitral valve is normal in structure. No evidence of mitral valve regurgitation. No evidence of mitral stenosis.  4. The aortic valve is normal in structure. Aortic valve regurgitation is not visualized. No aortic stenosis is present.  5. The inferior vena cava is normal in size with greater than 50% respiratory variability, suggesting right atrial pressure of 3 mmHg. FINDINGS  Left Ventricle: Left ventricular ejection fraction, by estimation, is 65 to 70%. The left ventricle has normal function. The left ventricle has no regional wall motion abnormalities. The left ventricular internal cavity size was normal in size. There is  no left ventricular hypertrophy. Right Ventricle: The right ventricular size is normal. No increase in right ventricular wall thickness. Right ventricular systolic function is normal. Left Atrium: Left atrial size was normal in size. Right Atrium: Right atrial size was normal in size. Pericardium: There is no evidence of pericardial effusion. Mitral Valve: The mitral valve is normal in structure. Normal mobility of the mitral valve leaflets. No evidence of mitral valve stenosis. Tricuspid Valve: The tricuspid valve is normal in structure. Tricuspid valve regurgitation is not demonstrated. No evidence of tricuspid stenosis. Aortic Valve: The aortic valve is normal in structure. Aortic valve regurgitation is not visualized. No aortic stenosis is present. Pulmonic Valve: The pulmonic valve was normal in structure. Pulmonic valve regurgitation is not visualized. No evidence of pulmonic stenosis. Aorta: The aortic root is normal in size and structure. Venous: The inferior vena cava is normal in size with greater than 50% respiratory variability, suggesting right atrial pressure of 3 mmHg. IAS/Shunts: No atrial level shunt detected by color flow Doppler.  LEFT VENTRICLE  PLAX 2D LVIDd:         4.00 cm  Diastology LVIDs:         2.55 cm  LV e' lateral:   7.40 cm/s LV PW:         0.97 cm  LV  E/e' lateral: 7.2 LV IVS:        1.03 cm  LV e' medial:    5.33 cm/s LVOT diam:     1.70 cm  LV E/e' medial:  10.0 LVOT Area:     2.27 cm  RIGHT VENTRICLE RV S prime:     5.11 cm/s LEFT ATRIUM         Index LA diam:    2.80 cm 1.31 cm/m   AORTA Ao Root diam: 2.10 cm MITRAL VALVE MV Area (PHT): 3.48 cm    SHUNTS MV Decel Time: 218 msec    Systemic Diam: 1.70 cm MV E velocity: 53.10 cm/s MV A velocity: 63.00 cm/s MV E/A ratio:  0.84 Candee Furbish MD Electronically signed by Candee Furbish MD Signature Date/Time: 05/07/2019/3:18:45 PM    Final

## 2019-05-11 LAB — CULTURE, BLOOD (ROUTINE X 2): Culture: NO GROWTH

## 2019-05-11 LAB — GLUCOSE, CAPILLARY
Glucose-Capillary: 117 mg/dL — ABNORMAL HIGH (ref 70–99)
Glucose-Capillary: 97 mg/dL (ref 70–99)
Glucose-Capillary: 114 mg/dL — ABNORMAL HIGH (ref 70–99)
Glucose-Capillary: 108 mg/dL — ABNORMAL HIGH (ref 70–99)

## 2019-05-11 NOTE — Progress Notes (Addendum)
Physical Therapy Treatment Patient Details Name: Erica [name] MRN: [id number] DOB: Oct 08, 1970 Today's Date: 05/11/2019    History of Present Illness 49 year old female admitted 04/28/19 with increasing SOB. Patient's symptoms began 04/20/19 with feeling tired and tested positive for COVID on 04/21/19. She started having cough, loss of taste, muscle aches, intermittent low-grade fevers. Oxygen saturation 81% at [facility] infusion clinic on 04/28/19. CXR: bilateral multifocal infiltrates. Patient admitted to the hospital with acute hypoxic respiratory failure due to COVID PNA requiring 6L [place]. IV Actemra given 04/29/19 as well as steroids and Remdesivir. Borderline elevated D-dimer. Started on Lovenox.  No significant PMH.    PT Comments    Patient progressing with her mobility and activity tolerance. She preferred to ambulate in room and not in hallway but motivated to ambulate several times in room. She also noted she walked earlier with assist from nursing staff. Use of RW for energy conservation. Oxygen stable on room air for ambulation 29ft each trial in room. Seated rest breaks in between gait trials. Education on energy conservation and pacing strategies. On evaluation, patient reported no stairs at home but today reports a few steps to enter with railing. Patient declined practicing stairs. Education provided on technique. She reports she can have someone assist her up the stairs into the home. Of note, patient now reports her parents are in the hospital also. Patient reports she may possibly discharge home Sunday and would be alone but have friends that can assist her with IADLs. Recommend home PT, use of RW, BSC for at night for safety and for energy conservation.   Follow Up Recommendations  Home health PT;Supervision - Intermittent     Equipment Recommendations  Rolling walker with 5" wheels;3in1 (PT)       Precautions / Restrictions Precautions Precautions: Fall;Other  (comment) Precaution Comments: monitor oxygen saturation Restrictions Weight Bearing Restrictions: No    Mobility  Bed Mobility General bed mobility comments: Patient aleady OOB in recliner chair.  Transfers Overall transfer level: Modified independent Equipment used: Rolling walker (2 wheeled);None Transfers: Sit to/from Stand Sit to Stand: Modified independent (Device/Increase time)  General transfer comment: sit<>stand with RW x 2 trials, sit>stand without AD  Ambulation/Gait Ambulation/Gait assistance: Supervision;Modified independent (Device/Increase time) Gait Distance (Feet): 44 Feet(x3) Assistive device: Rolling walker (2 wheeled);None Gait Pattern/deviations: Step-through pattern Gait velocity: decreased  General Gait Details: Patient on room air. Oxygen saturation >/=90%. Use of RW for energy conservation. Trial 2 without AD for approx 12 ft then use of RW for remainder of gait trial.    Stairs Stairs: (pt declined practicing stairs, says someone can assist her)    Balance Standing balance support: No upper extremity supported Standing balance-Leahy Scale: (Good-)    Cognition Arousal/Alertness: Awake/alert Behavior During Therapy: WFL for tasks assessed/performed Overall Cognitive Status: Within Functional Limits for tasks assessed    Exercises Other Exercises Other Exercises: patient doing flutter valve upon PT arrival    General Comments General comments (skin integrity, edema, etc.): Patient on room air, oxygen saturation 93% or better gait trial 1 and 2, HR 111 bpm. Trial 3, oxygen saturation 90% or better on room air, HR 117 bpm. 88% after patient coughing but not sustained.       Pertinent Vitals/Pain Pain Assessment: No/denies pain(No complaints of pain, no signs/symptoms of pain.)           PT Goals (current goals can now be found in the care plan section) Progress towards PT goals: Progressing toward goals  Frequency    Min  3X/week      PT Plan Current plan remains appropriate       AM-PAC PT "6 Clicks" Mobility   Outcome Measure  Help needed turning from your back to your side while in a flat bed without using bedrails?: None Help needed moving from lying on your back to sitting on the side of a flat bed without using bedrails?: None Help needed moving to and from a bed to a chair (including a wheelchair)?: None Help needed standing up from a chair using your arms (e.g., wheelchair or bedside chair)?: None Help needed to walk in hospital room?: A Little Help needed climbing 3-5 steps with a railing? : A Little 6 Click Score: 22    End of Session   Activity Tolerance: Patient tolerated treatment well Patient left: in chair;with call bell/phone within reach Nurse Communication: Mobility status;Other (comment)(oxygen saturation) PT Visit Diagnosis: Other abnormalities of gait and mobility (R26.89)     Time: [id number] PT Time Calculation (min) (ACUTE ONLY): 24 min  Charges:  $Gait Training: 23-37 mins                     Birdie Hopes, PT, DPT Acute Rehab [phone] office     Birdie Hopes 05/11/2019, 1:36 PM

## 2019-05-11 NOTE — Progress Notes (Signed)
Pharmacy Antibiotic Note  Erica [name] is a 49 y.o. female on antibiotics for MSSA bacteremia.  Pharmacy has been consulted for Cefazolin dosing on 5/4, changed from Unasyn.  Day # 6 antibiotics.    ID signed off 5/5, with plan for Cefazolin for 3 more days, then Oritavancin x 1 on 5/9.  Plan:  Cefazolin 2 gm IV q8h thru 6am on 5/9  Oritavancin 1200 mg IV x 1 at 12n on 5/9.  Height: 5\' 5"  (165.1 cm) Weight: 108 kg (238 lb 1.6 oz) IBW/kg (Calculated) : 57  Temp (24hrs), Avg:98.4 F (36.9 C), Min:98.1 F (36.7 C), Max:98.6 F (37 C)  Recent Labs  Lab 05/06/19 0655 05/07/19 0517 05/08/19 0530 05/09/19 0740 05/10/19 0306  WBC 23.4* 25.7* 16.3* 9.5 5.8  CREATININE 1.07* 1.04* 0.96 0.99 0.91    Estimated Creatinine Clearance: 89.4 mL/min (by C-G formula based on SCr of 0.91 mg/dL).    No Known Allergies  Antimicrobials this admission:  Unasyn 5/2>>5/3  Cefazolin 5/3>>5/9  Oritavancin planned x 1 on 5/9   Remdesivir 4/25 >> 4/29  Actemra x 1 on 4/25  CHG/Bactroban 5/4>>(5/8)  Dose adjustments this admission:  n/a  Microbiology results:  4/25 Blood x 2: negative  4/25 HIV: non-reactive  5/2 sputum: normal respiratory flora  5/2 blood x 2 - 1/4 MSSA    -  other cx negative  5/3 blood x 2: ng x 4 days to date  5/2 MRSA PCR: positive  Thank you for allowing pharmacy to be a part of this patient's care.  Arty Baumgartner, [place] Phone: [phone] 05/11/2019 3:29 PM

## 2019-05-11 NOTE — Progress Notes (Signed)
PROGRESS NOTE                                                                                                                                                                                                             Patient Demographics:    Erica [name], is a 49 y.o. female, DOB - April 18, 1970, XLK:[id number]  Outpatient Primary MD for the patient is Alroy Dust, L.Marlou Sa, MD    LOS - 13  Admit date - 04/28/2019    Chief Complaint  Patient presents with  . COVID  . Shortness of Breath       Brief Narrative    - Erica [name] is a 49 y.o. female with no significant past medical history presents the emergency department today with increasing short of breath.  Her symptoms started on 04/20/2019, initially was only feeling tired but no short of breath.  She had recently tested positive on 04/21/2019 for COVID-19 pneumonia and was placed on oral steroid treatment without much benefit, came to the ER with shortness of breath and was diagnosed with acute hypoxic respiratory failure due to COVID-19 pneumonia and admitted to the hospital with 6 L nasal cannula oxygen requirement.  She was treated promptly with IV steroids, remdesivir and Actemra, unfortunately her course was complicated by development of pneumomediastinum and small pneumothorax which has contained, she has developed aspiration bacterial pneumonia for which ID is following the patient.  She has 1 out of 4 blood cultures positive for MSSA bacteremia.   Subjective:   Patient in bed, appears comfortable, denies any headache, no fever, no chest pain or pressure, she still reporting cough, with productive yellow phlegm.   Assessment  & Plan :    Acute Hypoxic Resp. Failure due to Acute Covid 19 Viral Pneumonitis during the ongoing 2020 Covid 19 Pandemic  - she has severe disease and was treated promptly after appropriate consents with IV Actemra on 04/29/2019 along with steroids and  Remdesivir, she has shown slow but definite improvement, currently room air at rest, down from 15L/ minute. -Unfortunately she has developed a small pneumothorax and pneumomediastinum which will be monitored, also signs of possible aspiration pneumonia .  Encouraged the patient to sit up in chair in the daytime use I-S and flutter valve for pulmonary toiletry and then prone in bed when at night.  Will advance activity  and titrate down oxygen as possible.  SpO2: 98 % O2 Flow Rate (L/min): 2 L/min   Recent Labs  Lab 05/05/19 0719 05/05/19 0719 05/06/19 0655 05/07/19 0517 05/08/19 0530 05/09/19 0740 05/10/19 0306  CRP <0.5   < > <0.5 0.5 0.6 0.6 0.6  DDIMER 2.88*   < > 3.42* 3.75* 5.30* 7.56* 7.85*  PROCALCITON 0.16  --  0.24 0.50 0.23  --   --    < > = values in this interval not displayed.    Hepatic Function Latest Ref Rng & Units 05/10/2019 05/09/2019 05/08/2019  Total Protein 6.5 - 8.1 g/dL 5.5(L) 5.3(L) 5.0(L)  Albumin 3.5 - 5.0 g/dL 2.6(L) 2.6(L) 2.5(L)  AST 15 - 41 U/L 43(H) 43(H) 32  ALT 0 - 44 U/L 51(H) 48(H) 45(H)  Alk Phosphatase 38 - 126 U/L 60 64 70  Total Bilirubin 0.3 - 1.2 mg/dL 0.7 0.6 0.9  Bilirubin, Direct 0.0 - 0.2 mg/dL - - -   MSSA bacteremia - 1 set of blood cultures was positive . - repeat blood cultures the following day remain negative.   - There is no evidence of endocarditis on TTE.   -Antibiotics management per ID, recommendation for 2 more days of IV cefazolin and then 1 dose of long-acting oritavancin.                                 History of shingles.   - Currently stable does not take Valtrex anymore at home on a scheduled basis.   Borderline elevated D-dimer.   -She has elevated D-dimer despite being on moderately high dose of Lovenox, lower extremity ultrasound negative, patient does have exposure to hormonal contraceptive for which I will place her on 1 month of prophylactic dose Eliquis upon discharge.  Her risk for developing a clot remains  very high.  D-dimers continue trending up, will continue to monitor closely, if continues to increase will consider CTA chest to R/O PE.  Obesity.  BMI 37.  Follow with PCP.  Superimposed aspiration bacterial pneumonia.  -  Patient strictly counseled multiple times to have all meals in the chair, she tends to eat in bed laying flat, cleared by speech, 1/4 bottles growing MSSA, was initially on Unasyn but now switched to cefazolin IV, stable echocardiogram, procalcitonin is stable, CT was discussed with pulmonary, ID following.  Note patient also received Actemra for her severe COVID-19 infection which does predispose her to bacterial infections.  ID following defer course of antibiotic treatment to ID at this point.    Condition - Extremely Guarded  Family Communication  : D/W patient  Code Status : Full  Diet :   Diet Order            Diet regular Room service appropriate? Yes with Assist; Fluid consistency: Thin  Diet effective now               Disposition Plan  : Stay inpatient in the hospital for treatment of severe acute hypoxic respiratory failure due to COVID-19 pneumonia and bacterial aspiration pneumonia.  Finish treatment course, thereafter discharge home.  Consults  : CT  discussed with pulmonary on 05/06/2019. ID  Procedures  :    TTE -   1. Left ventricular ejection fraction, by estimation, is 65 to 70%. The left ventricle has normal function. The left ventricle has no regional wall motion abnormalities. Left ventricular diastolic parameters are consistent with Grade  I diastolic dysfunction (impaired relaxation).  2. Right ventricular systolic function is normal. The right ventricular size is normal.  3. The mitral valve is normal in structure. No evidence of mitral valve regurgitation. No evidence of mitral stenosis.  4. The aortic valve is normal in structure. Aortic valve regurgitation is not visualized. No aortic stenosis is present.  5. The inferior vena  cava is normal in size with greater than 50% respiratory variability, suggesting right atrial pressure of 3 mmHg.  Leg [place] - No DVT  CT - Extensive BILATERAL patchy airspace infiltrates consistent with multifocal pneumonia. Multiple cavitary foci are identified in both lungs, greater in the lower lobes; this is uncommon with COVID-19 alone, suggesting a superimposed cavitary pneumonia or sequela of aspiration pneumonia. Pneumomediastinum with gas extending into the cervical regions and less into the axilla as above; this is of uncertain etiology, could be related to barotrauma, coughing, mechanical ventilation.   PUD Prophylaxis : None  DVT Prophylaxis  :  Lovenox    Lab Results  Component Value Date   PLT 176 05/10/2019    Inpatient Medications  Scheduled Meds: . albuterol  2 puff Inhalation Q6H  . Chlorhexidine Gluconate Cloth  6 each Topical Q0600  . enoxaparin (LOVENOX) injection  60 mg Subcutaneous Q12H  . insulin aspart  0-15 Units Subcutaneous TID WC  . insulin aspart  0-5 Units Subcutaneous QHS  . insulin glargine  15 Units Subcutaneous Daily  . mouth rinse  15 mL Mouth Rinse BID  . multivitamin with minerals  1 tablet Oral Daily  . mupirocin ointment  1 application Nasal BID   Continuous Infusions: .  ceFAZolin (ANCEF) IV 2 g (05/11/19 1543)  . [START ON 05/13/2019] oritavancin (ORBACTIV) IVPB     PRN Meds:.acetaminophen, guaiFENesin-dextromethorphan, menthol-cetylpyridinium, valACYclovir  Antibiotics  :    Anti-infectives (From admission, onward)   Start     Dose/Rate Route Frequency Ordered Stop   05/13/19 1200  Oritavancin Diphosphate (ORBACTIV) 1,200 mg in dextrose 5 % IVPB     1,200 mg 333.3 mL/hr over 180 Minutes Intravenous Once 05/09/19 1126     05/07/19 1400  ceFAZolin (ANCEF) IVPB 2g/100 mL premix     2 g 200 mL/hr over 30 Minutes Intravenous Every 8 hours 05/07/19 1244 05/13/19 2359   05/06/19 1200  Ampicillin-Sulbactam (UNASYN) 3 g in sodium chloride 0.9  % 100 mL IVPB  Status:  Discontinued     3 g 200 mL/hr over 30 Minutes Intravenous Every 8 hours 05/06/19 1029 05/07/19 1445   04/30/19 1000  remdesivir 100 mg in sodium chloride 0.9 % 100 mL IVPB     100 mg 200 mL/hr over 30 Minutes Intravenous Daily 04/29/19 0018 05/03/19 0832   04/29/19 1034  valACYclovir (VALTREX) tablet 500 mg    Note to Pharmacy: Patient taking differently: Take one tablet (500 mg) by mouth twice daily for one week - as needed for breakouts     500 mg Oral 2 times daily between meals and at bedtime PRN 04/29/19 0950     04/29/19 1000  remdesivir 100 mg in sodium chloride 0.9 % 100 mL IVPB  Status:  Discontinued     100 mg 200 mL/hr over 30 Minutes Intravenous Daily 04/28/19 1736 04/29/19 0018   04/29/19 0100  remdesivir 100 mg in sodium chloride 0.9 % 100 mL IVPB     100 mg 200 mL/hr over 30 Minutes Intravenous Every 30 min 04/29/19 0018 04/29/19 0700   04/28/19 1745  remdesivir  200 mg in sodium chloride 0.9% 250 mL IVPB  Status:  Discontinued     200 mg 580 mL/hr over 30 Minutes Intravenous Once 04/28/19 1736 04/29/19 0018       Liesel Peckenpaugh M.D on 05/11/2019 at 5:01 PM  To page go to [email or web address] - password [email or web address]  [facility] -  Office  [phone]    See all Orders from today for further details    Objective:   Vitals:   05/10/19 2126 05/11/19 0444 05/11/19 0445 05/11/19 0741  BP: 129/82 117/73 117/73   Pulse: 89 95 91   Resp: 18     Temp: 98.6 F (37 C) 98.1 F (36.7 C)    TempSrc: Oral Oral    SpO2: 100% (!) 89% 91% 98%  Weight:      Height:        Wt Readings from Last 3 Encounters:  05/02/19 108 kg  01/24/19 106.1 kg  04/29/11 96.6 kg    No intake or output data in the 24 hours ending 05/11/19 1701   Physical Exam  Awake Alert, Oriented X 3, No new F.N deficits, Normal affect Symmetrical Chest wall movement, Good air movement bilaterally, CTAB RRR,No Gallops,Rubs or new Murmurs, No Parasternal Heave +ve B.Sounds,  Abd Soft, No tenderness, No rebound - guarding or rigidity. No Cyanosis, Clubbing or edema, No new Rash or bruise      Data Review:    CBC Recent Labs  Lab 05/06/19 0655 05/07/19 0517 05/08/19 0530 05/09/19 0740 05/10/19 0306  WBC 23.4* 25.7* 16.3* 9.5 5.8  HGB 15.1* 13.8 13.3 13.5 13.4  HCT 46.0 42.3 39.8 40.7 40.5  PLT 332 264 234 200 176  MCV 90.4 92.2 91.5 92.3 91.6  MCH 29.7 30.1 30.6 30.6 30.3  MCHC 32.8 32.6 33.4 33.2 33.1  RDW 14.0 14.2 14.2 14.1 14.3  LYMPHSABS 2.0 2.8 2.8 1.6 1.8  MONOABS 0.8 1.0 0.8 0.7 0.6  EOSABS 0.2 0.3 0.1 0.1 0.1  BASOSABS 0.1 0.0 0.1 0.1 0.1    Chemistries  Recent Labs  Lab 05/05/19 0719 05/05/19 0719 05/06/19 0655 05/07/19 0517 05/08/19 0530 05/09/19 0740 05/10/19 0306  NA 139   < > 139 141 141 141 141  K 4.0   < > 3.9 3.8 3.6 3.5 3.8  CL 102   < > 104 106 106 109 108  CO2 24   < > _0 GLUCOSE 132*   < > 121* 109* 113* 112* 102*  BUN 17   < > _1 5* 6  CREATININE 1.08*   < > 1.07* 1.04* 0.96 0.99 0.91  CALCIUM 8.2*   < > 8.2* 7.8* 8.1* 8.0* 8.3*  AST 68*  --   --  38 32 43* 43*  ALT 79*  --   --  51* 45* 48* 51*  ALKPHOS 80  --   --  82 70 64 60  BILITOT 0.9  --   --  1.0 0.9 0.6 0.7  MG  --   --   --  2.2 2.2 2.1 2.2   < > = values in this interval not displayed.     ------------------------------------------------------------------------------------------------------------------ No results for input(s): CHOL, HDL, LDLCALC, TRIG, CHOLHDL, LDLDIRECT in the last 72 hours.  Lab Results  Component Value Date   HGBA1C 6.2 (H) 04/28/2019   ------------------------------------------------------------------------------------------------------------------ No results for input(s): TSH, T4TOTAL, T3FREE, THYROIDAB in the last 72 hours.  Invalid input(s): FREET3  Cardiac Enzymes No results  for input(s): CKMB, TROPONINI, MYOGLOBIN in the last 168 hours.  Invalid input(s):  CK ------------------------------------------------------------------------------------------------------------------    Component Value Date/Time   BNP 45.8 05/03/2019 0344    Micro Results Recent Results (from the past 240 hour(s))  Culture, blood (routine x 2)     Status: None   Collection Time: 05/06/19 10:44 AM   Specimen: BLOOD  Result Value Ref Range Status   Specimen Description BLOOD SITE NOT SPECIFIED  Final   Special Requests   Final    BOTTLES DRAWN AEROBIC ONLY Blood Culture results may not be optimal due to an inadequate volume of blood received in culture bottles   Culture   Final    NO GROWTH 5 DAYS Performed at [facility], [place] [address], [place], [place] [address]    Report Status 05/11/2019 FINAL  Final  Culture, blood (routine x 2)     Status: Abnormal   Collection Time: 05/06/19 10:47 AM   Specimen: BLOOD RIGHT ARM  Result Value Ref Range Status   Specimen Description BLOOD RIGHT ARM  Final   Special Requests   Final    BOTTLES DRAWN AEROBIC AND ANAEROBIC Blood Culture adequate volume   Culture  Setup Time   Final    GRAM POSITIVE COCCI IN CLUSTERS ANAEROBIC BOTTLE ONLY CRITICAL RESULT CALLED TO, READ BACK BY AND VERIFIED WITH: TJaneice Robinson PHARMD, AT 1059 05/07/19 BY D. VANHOOK Performed at [facility], [place] [address] [place], [place] [address]    Culture STAPHYLOCOCCUS AUREUS (A)  Final   Report Status 05/09/2019 FINAL  Final   Organism ID, Bacteria STAPHYLOCOCCUS AUREUS  Final      Susceptibility   Staphylococcus aureus - MIC*    CIPROFLOXACIN <=0.5 SENSITIVE Sensitive     ERYTHROMYCIN <=0.25 SENSITIVE Sensitive     GENTAMICIN <=0.5 SENSITIVE Sensitive     OXACILLIN <=0.25 SENSITIVE Sensitive     TETRACYCLINE <=1 SENSITIVE Sensitive     VANCOMYCIN 1 SENSITIVE Sensitive     TRIMETH/SULFA <=10 SENSITIVE Sensitive     CLINDAMYCIN <=0.25 SENSITIVE Sensitive     RIFAMPIN <=0.5 SENSITIVE Sensitive     Inducible Clindamycin NEGATIVE  Sensitive     * STAPHYLOCOCCUS AUREUS  Blood Culture ID Panel (Reflexed)     Status: Abnormal   Collection Time: 05/06/19 10:47 AM  Result Value Ref Range Status   Enterococcus species NOT DETECTED NOT DETECTED Final   Listeria monocytogenes NOT DETECTED NOT DETECTED Final   Staphylococcus species DETECTED (A) NOT DETECTED Final    Comment: CRITICAL RESULT CALLED TO, READ BACK BY AND VERIFIED WITH: T. BAUMEISTER PHARMD, AT 1059 05/07/19 BY D. VANHOOK    Staphylococcus aureus (BCID) DETECTED (A) NOT DETECTED Final    Comment: Methicillin (oxacillin) susceptible Staphylococcus aureus (MSSA). Preferred therapy is anti staphylococcal beta lactam antibiotic (Cefazolin or Nafcillin), unless clinically contraindicated. CRITICAL RESULT CALLED TO, READ BACK BY AND VERIFIED WITH: T. BAUMEISTER PHARMD, AT 1059 05/07/19 BY D. VANHOOK    Methicillin resistance NOT DETECTED NOT DETECTED Final   Streptococcus species NOT DETECTED NOT DETECTED Final   Streptococcus agalactiae NOT DETECTED NOT DETECTED Final   Streptococcus pneumoniae NOT DETECTED NOT DETECTED Final   Streptococcus pyogenes NOT DETECTED NOT DETECTED Final   Acinetobacter baumannii NOT DETECTED NOT DETECTED Final   Enterobacteriaceae species NOT DETECTED NOT DETECTED Final   Enterobacter cloacae complex NOT DETECTED NOT DETECTED Final   Escherichia coli NOT DETECTED NOT DETECTED Final   Klebsiella oxytoca NOT DETECTED  NOT DETECTED Final   Klebsiella pneumoniae NOT DETECTED NOT DETECTED Final   Proteus species NOT DETECTED NOT DETECTED Final   Serratia marcescens NOT DETECTED NOT DETECTED Final   Haemophilus influenzae NOT DETECTED NOT DETECTED Final   Neisseria meningitidis NOT DETECTED NOT DETECTED Final   Pseudomonas aeruginosa NOT DETECTED NOT DETECTED Final   Candida albicans NOT DETECTED NOT DETECTED Final   Candida glabrata NOT DETECTED NOT DETECTED Final   Candida krusei NOT DETECTED NOT DETECTED Final   Candida parapsilosis NOT  DETECTED NOT DETECTED Final   Candida tropicalis NOT DETECTED NOT DETECTED Final    Comment: Performed at [facility], [place] [address], [place], [place] [address]  MRSA PCR Screening     Status: Abnormal   Collection Time: 05/06/19 10:59 AM   Specimen: Nasal Mucosa; Nasopharyngeal  Result Value Ref Range Status   MRSA by PCR POSITIVE (A) NEGATIVE Final    Comment:        The GeneXpert MRSA Assay (FDA approved for NASAL specimens only), is one component of a comprehensive MRSA colonization surveillance program. It is not intended to diagnose MRSA infection nor to guide or monitor treatment for MRSA infections. CRITICAL RESULT CALLED TO, READ BACK BY AND VERIFIED WITH: RN Salley Slaughter [phone] AT 1414 BY CM Performed at [facility], [place] [address], [place], [place] [address]   Expectorated sputum assessment w rflx to resp cult     Status: None   Collection Time: 05/06/19  1:00 PM   Specimen: Expectorated Sputum  Result Value Ref Range Status   Specimen Description Expect. Sput  Final   Special Requests Normal  Final   Sputum evaluation   Final    THIS SPECIMEN IS ACCEPTABLE FOR SPUTUM CULTURE Performed at [facility], [place] [address] [place], [place] [address]    Report Status 05/06/2019 FINAL  Final  Culture, respiratory     Status: None   Collection Time: 05/06/19  1:00 PM  Result Value Ref Range Status   Specimen Description Expect. Sput  Final   Special Requests Normal Reflexed from [id number]  Final   Gram Stain   Final    RARE WBC PRESENT, PREDOMINANTLY PMN FEW GRAM POSITIVE COCCI IN PAIRS FEW GRAM NEGATIVE RODS FEW GRAM POSITIVE RODS    Culture   Final    ABUNDANT Consistent with normal respiratory flora. Performed at [facility], [place] [address] [place], [place] [address]    Report Status 05/08/2019 FINAL  Final  Culture, blood (routine x 2)     Status: None (Preliminary result)   Collection Time: 05/07/19  9:02 PM   Specimen: BLOOD RIGHT  ARM  Result Value Ref Range Status   Specimen Description BLOOD RIGHT ARM  Final   Special Requests   Final    BOTTLES DRAWN AEROBIC ONLY Blood Culture results may not be optimal due to an inadequate volume of blood received in culture bottles   Culture   Final    NO GROWTH 4 DAYS Performed at [facility], [place] [address], [place], [place] [address]    Report Status PENDING  Incomplete  Culture, blood (routine x 2)     Status: None (Preliminary result)   Collection Time: 05/07/19  9:02 PM   Specimen: BLOOD RIGHT ARM  Result Value Ref Range Status   Specimen Description BLOOD RIGHT ARM  Final   Special Requests   Final    BOTTLES DRAWN AEROBIC  ONLY Blood Culture adequate volume   Culture   Final    NO GROWTH 4 DAYS Performed at [place] [address] [place], [place] [address]    Report Status PENDING  Incomplete    Radiology Reports CT CHEST WO CONTRAST  Result Date: 05/06/2019 CLINICAL DATA:  Chest pain, shortness of breath, pneumomediastinum, acute hypoxic respiratory failure due to COVID-19 viral pneumonia EXAM: CT CHEST WITHOUT CONTRAST TECHNIQUE: Multidetector CT imaging of the chest was performed following the standard protocol without IV contrast. Sagittal and coronal MPR images reconstructed from axial data set. COMPARISON:  None FINDINGS: Cardiovascular: Aorta normal caliber. Heart size normal. No pericardial effusion. Mediastinum/Nodes: Small hiatal hernia. Scattered pneumomediastinum few normal size mediastinal lymph nodes without thoracic adenopathy. Additional soft tissue gas is seen extending into the axilla and cervical regions bilaterally. Lungs/Pleura: Patchy airspace infiltrates throughout both lungs consistent with multifocal pneumonia and history of COVID-19. Multiple small cavitary foci are identified within the infiltrates in both lungs, greater in lower lobes. No pleural effusion or pneumothorax. No discrete pulmonary mass/nodule. Upper Abdomen:  Visualized upper abdomen unremarkable Musculoskeletal: Osseous structures unremarkable. IMPRESSION: Extensive BILATERAL patchy airspace infiltrates consistent with multifocal pneumonia. Multiple cavitary foci are identified in both lungs, greater in the lower lobes; this is uncommon with COVID-19 alone, suggesting a superimposed cavitary pneumonia or sequela of aspiration pneumonia. Pneumomediastinum with gas extending into the cervical regions and less into the axilla as above; this is of uncertain etiology, could be related to barotrauma, coughing, mechanical ventilation. Electronically Signed   By: Lavonia Dana M.D.   On: 05/06/2019 10:11   DG Chest Port 1 View  Result Date: 05/05/2019 CLINICAL DATA:  Increasing shortness of breath. EXAM: PORTABLE CHEST 1 VIEW COMPARISON:  Chest radiograph 05/03/2019 FINDINGS: There is pneumomediastinum and subcutaneous emphysema throughout the soft tissues of the visualized neck base. There are worsening bilateral diffuse airspace opacities. There is more focal consolidation in the right peripheral lung. No significant pleural effusion. Query tiny right apical pneumothorax. IMPRESSION: 1. Pneumomediastinum and subcutaneous emphysema throughout the soft tissues of the visualized neck base. 2. Interval worsening of bilateral diffuse airspace opacities. 3. Query tiny right apical pneumothorax. Electronically Signed   By: Audie Pinto M.D.   On: 05/05/2019 16:31   DG Chest Port 1 View  Result Date: 05/03/2019 CLINICAL DATA:  Shortness of breath, COVID EXAM: PORTABLE CHEST 1 VIEW COMPARISON:  04/28/2019 FINDINGS: Cardiomegaly. There is interval increase in heterogeneous bilateral, bibasilar predominant airspace opacity. IMPRESSION: Interval increase in heterogeneous bilateral, bibasilar predominant airspace opacity, consistent with worsened multifocal infection and in keeping with COVID airspace disease. Electronically Signed   By: Eddie Candle M.D.   On: 05/03/2019 10:51     DG Chest Port 1 View  Result Date: 04/28/2019 CLINICAL DATA:  COVID-19 positive, short of breath, hypoxia EXAM: PORTABLE CHEST 1 VIEW COMPARISON:  None. FINDINGS: Single frontal view of the chest demonstrates an unremarkable cardiac silhouette. Diffuse interstitial prominence with patchy bilateral ground-glass opacities greatest at the lung bases. No effusion or pneumothorax. No acute bony abnormalities. IMPRESSION: 1. Multifocal pneumonia compatible with COVID-19.  The Electronically Signed   By: Randa Ngo M.D.   On: 04/28/2019 16:47   VAS [place] LOWER EXTREMITY VENOUS (DVT)  Result Date: 05/03/2019  Lower Venous DVTStudy Indications: Edema, and Swelling.  Comparison Study: no prior Performing Technologist: Abram Sander RVS  Examination Guidelines: A complete evaluation includes B-mode imaging, spectral Doppler, color Doppler, and power Doppler as needed of all  accessible portions of each vessel. Bilateral testing is considered an integral part of a complete examination. Limited examinations for reoccurring indications may be performed as noted. The reflux portion of the exam is performed with the patient in reverse Trendelenburg.  +---------+---------------+---------+-----------+----------+--------------+ RIGHT    CompressibilityPhasicitySpontaneityPropertiesThrombus Aging +---------+---------------+---------+-----------+----------+--------------+ CFV      Full           Yes      Yes                                 +---------+---------------+---------+-----------+----------+--------------+ SFJ      Full                                                        +---------+---------------+---------+-----------+----------+--------------+ FV Prox  Full                                                        +---------+---------------+---------+-----------+----------+--------------+ FV Mid   Full                                                         +---------+---------------+---------+-----------+----------+--------------+ FV DistalFull                                                        +---------+---------------+---------+-----------+----------+--------------+ PFV      Full                                                        +---------+---------------+---------+-----------+----------+--------------+ POP      Full           Yes      Yes                                 +---------+---------------+---------+-----------+----------+--------------+ PTV      Full                                                        +---------+---------------+---------+-----------+----------+--------------+ PERO                                                  Not visualized +---------+---------------+---------+-----------+----------+--------------+   +---------+---------------+---------+-----------+----------+--------------+ LEFT     CompressibilityPhasicitySpontaneityPropertiesThrombus Aging +---------+---------------+---------+-----------+----------+--------------+ CFV  Full           Yes      Yes                                 +---------+---------------+---------+-----------+----------+--------------+ SFJ      Full                                                        +---------+---------------+---------+-----------+----------+--------------+ FV Prox  Full                                                        +---------+---------------+---------+-----------+----------+--------------+ FV Mid   Full                                                        +---------+---------------+---------+-----------+----------+--------------+ FV DistalFull                                                        +---------+---------------+---------+-----------+----------+--------------+ PFV      Full                                                         +---------+---------------+---------+-----------+----------+--------------+ POP      Full           Yes      Yes                                 +---------+---------------+---------+-----------+----------+--------------+ PTV      Full                                                        +---------+---------------+---------+-----------+----------+--------------+ PERO                                                  Not visualized +---------+---------------+---------+-----------+----------+--------------+     Summary: BILATERAL: - No evidence of deep vein thrombosis seen in the lower extremities, bilaterally.   *See table(s) above for measurements and observations. Electronically signed by Curt Jews MD on 05/03/2019 at 4:26:06 PM.    Final    ECHOCARDIOGRAM LIMITED  Result Date: 05/07/2019    ECHOCARDIOGRAM LIMITED REPORT   Patient Name:   Erica [name] Date of Exam:  05/07/2019 Medical Rec #:  [id number]    Height:       65.0 in Accession #:    [id number]   Weight:       238.1 lb Date of Birth:  April 20, 1970    BSA:          2.130 m Patient Age:    41 years     BP:           112/61 mmHg Patient Gender: F            HR:           84 bpm. Exam Location:  Inpatient Procedure: Limited Echo, Cardiac Doppler and Color Doppler Indications:    CHF  History:        Patient has no prior history of Echocardiogram examinations.                 Signs/Symptoms:Shortness of Breath. COVID POSITIVE, pneumonia,                 Obesity.  Sonographer:    Dustin Flock Referring Phys: Rankin Carleton  1. Left ventricular ejection fraction, by estimation, is 65 to 70%. The left ventricle has normal function. The left ventricle has no regional wall motion abnormalities. Left ventricular diastolic parameters are consistent with Grade I diastolic dysfunction (impaired relaxation).  2. Right ventricular systolic function is normal. The right ventricular size is normal.  3. The mitral valve is normal in  structure. No evidence of mitral valve regurgitation. No evidence of mitral stenosis.  4. The aortic valve is normal in structure. Aortic valve regurgitation is not visualized. No aortic stenosis is present.  5. The inferior vena cava is normal in size with greater than 50% respiratory variability, suggesting right atrial pressure of 3 mmHg. FINDINGS  Left Ventricle: Left ventricular ejection fraction, by estimation, is 65 to 70%. The left ventricle has normal function. The left ventricle has no regional wall motion abnormalities. The left ventricular internal cavity size was normal in size. There is  no left ventricular hypertrophy. Right Ventricle: The right ventricular size is normal. No increase in right ventricular wall thickness. Right ventricular systolic function is normal. Left Atrium: Left atrial size was normal in size. Right Atrium: Right atrial size was normal in size. Pericardium: There is no evidence of pericardial effusion. Mitral Valve: The mitral valve is normal in structure. Normal mobility of the mitral valve leaflets. No evidence of mitral valve stenosis. Tricuspid Valve: The tricuspid valve is normal in structure. Tricuspid valve regurgitation is not demonstrated. No evidence of tricuspid stenosis. Aortic Valve: The aortic valve is normal in structure. Aortic valve regurgitation is not visualized. No aortic stenosis is present. Pulmonic Valve: The pulmonic valve was normal in structure. Pulmonic valve regurgitation is not visualized. No evidence of pulmonic stenosis. Aorta: The aortic root is normal in size and structure. Venous: The inferior vena cava is normal in size with greater than 50% respiratory variability, suggesting right atrial pressure of 3 mmHg. IAS/Shunts: No atrial level shunt detected by color flow Doppler.  LEFT VENTRICLE PLAX 2D LVIDd:         4.00 cm  Diastology LVIDs:         2.55 cm  LV e' lateral:   7.40 cm/s LV PW:         0.97 cm  LV E/e' lateral: 7.2 LV IVS:         1.03 cm  LV e' medial:  5.33 cm/s LVOT diam:     1.70 cm  LV E/e' medial:  10.0 LVOT Area:     2.27 cm  RIGHT VENTRICLE RV S prime:     5.11 cm/s LEFT ATRIUM         Index LA diam:    2.80 cm 1.31 cm/m   AORTA Ao Root diam: 2.10 cm MITRAL VALVE MV Area (PHT): 3.48 cm    SHUNTS MV Decel Time: 218 msec    Systemic Diam: 1.70 cm MV E velocity: 53.10 cm/s MV A velocity: 63.00 cm/s MV E/A ratio:  0.84 Candee Furbish MD Electronically signed by Candee Furbish MD Signature Date/Time: 05/07/2019/3:18:45 PM    Final

## 2019-05-11 NOTE — TOC Progression Note (Signed)
Transition of Care [facility]) - Progression Note    Patient Details  Name: WYVETTA LORITZ MRN: [id number] Date of Birth: [birth year]  Transition of Care [facility]) CM/SW Contact  Maryclare Labrador, RN Phone Number: 05/11/2019, 3:46 PM  Clinical Narrative:   CM spoke with pt regarding recommendation for [facility] and DME. Pt stated she would like to wait until she is closer to discharge to discuss or decide if recommendation is necessary or a specific agency choice    Expected Discharge Plan: [place] Barriers to Discharge: Continued Medical Work up  Expected Discharge Plan and Services Expected Discharge Plan: [place]   Discharge Planning Services: CM Consult Post Acute Care Choice: Durable Medical Equipment                   DME Arranged: Oxygen DME Agency: [employer] Date DME Agency Contacted: 05/07/19 Time DME Agency Contacted: [phone] Representative spoke with at DME Agency: [place]: PT [place]: [place] Date [place]: 05/04/19   Representative spoke with at [place]: [place] ([place]) Interventions    Readmission Risk Interventions No flowsheet data found.

## 2019-05-12 LAB — BASIC METABOLIC PANEL
Anion gap: 13 (ref 5–15)
BUN: 8 mg/dL (ref 6–20)
CO2: 19 mmol/L — ABNORMAL LOW (ref 22–32)
Calcium: 8.5 mg/dL — ABNORMAL LOW (ref 8.9–10.3)
Chloride: 107 mmol/L (ref 98–111)
Creatinine, Ser: 0.92 mg/dL (ref 0.44–1.00)
GFR calc Af Amer: >60 mL/min (ref >60)
GFR calc non Af Amer: >60 mL/min (ref >60)
Glucose, Bld: 114 mg/dL — ABNORMAL HIGH (ref 70–99)
Potassium: 3.4 mmol/L — ABNORMAL LOW (ref 3.5–5.1)
Sodium: 139 mmol/L (ref 135–145)

## 2019-05-12 LAB — GLUCOSE, CAPILLARY
Glucose-Capillary: 90 mg/dL (ref 70–99)
Glucose-Capillary: 85 mg/dL (ref 70–99)
Glucose-Capillary: 91 mg/dL (ref 70–99)
Glucose-Capillary: 105 mg/dL — ABNORMAL HIGH (ref 70–99)

## 2019-05-12 LAB — CBC
HCT: 43.8 % (ref 36.0–46.0)
Hemoglobin: 14.3 g/dL (ref 12.0–15.0)
MCH: 30.2 pg (ref 26.0–34.0)
MCHC: 32.6 g/dL (ref 30.0–36.0)
MCV: 92.4 fL (ref 80.0–100.0)
Platelets: 140 10*3/uL — ABNORMAL LOW (ref 150–400)
RBC: 4.74 MIL/uL (ref 3.87–5.11)
RDW: 14.9 % (ref 11.5–15.5)
WBC: 6.3 10*3/uL (ref 4.0–10.5)
nRBC: 0 % (ref 0.0–0.2)

## 2019-05-12 LAB — D-DIMER, QUANTITATIVE: D-Dimer, Quant: 3.26 ug/mL-FEU — ABNORMAL HIGH (ref 0.00–0.50)

## 2019-05-12 LAB — CULTURE, BLOOD (ROUTINE X 2)
Culture: NO GROWTH
Culture: NO GROWTH
Special Requests: ADEQUATE

## 2019-05-12 MED ORDER — POTASSIUM CHLORIDE CRYS ER 20 MEQ PO TBCR
40.0000 meq | EXTENDED_RELEASE_TABLET | Freq: Four times a day (QID) | ORAL | Status: AC
  Administered 2019-05-12 (×2): 40 meq via ORAL
  Filled 2019-05-12 (×2): qty 2

## 2019-05-12 NOTE — Progress Notes (Signed)
PROGRESS NOTE                                                                                                                                                                                                             Patient Demographics:    Erica [name], is a 49 y.o. female, DOB - February 11, 1970, GOT:[id number]  Outpatient Primary MD for the patient is Alroy Dust, L.Marlou Sa, MD    LOS - 14  Admit date - 04/28/2019    Chief Complaint  Patient presents with  . COVID  . Shortness of Breath       Brief Narrative    Erica [name] is a 49 y.o. female with no significant past medical history presents the emergency department today with increasing short of breath.  Her symptoms started on 04/20/2019, initially was only feeling tired but no short of breath.  She had recently tested positive on 04/21/2019 for COVID-19 pneumonia and was placed on oral steroid treatment without much benefit, came to the ER with shortness of breath and was diagnosed with acute hypoxic respiratory failure due to COVID-19 pneumonia and admitted to the hospital with 6 L nasal cannula oxygen requirement.  She was treated promptly with IV steroids, remdesivir and Actemra, unfortunately her course was complicated by development of pneumomediastinum and small pneumothorax which has contained, she has developed aspiration bacterial pneumonia for which ID is following the patient.  She has 1 out of 4 blood cultures positive for MSSA bacteremia.   Subjective:   Patient reports dyspnea has improved at rest, but still significant with activity, but overall feels she is improving, still having cough, but much less productive.   Assessment  & Plan :    Acute Hypoxic Resp. Failure due to Acute Covid 19 Viral Pneumonitis during the ongoing 2020 Covid 19 Pandemic  - she has severe disease and was treated promptly after appropriate consents with IV Actemra on 04/29/2019 along with  steroids and Remdesivir, she has shown slow but definite improvement, currently room air at rest, down from 15L/ minute. -Unfortunately she has developed a small pneumothorax and pneumomediastinum which will be monitored, also signs of possible aspiration pneumonia .  Encouraged the patient to sit up in chair in the daytime use I-S and flutter valve for pulmonary toiletry and then prone in bed when at night.  Will advance  activity and titrate down oxygen as possible.  SpO2: 100 % O2 Flow Rate (L/min): 2 L/min   Recent Labs  Lab 05/06/19 0655 05/06/19 0655 05/07/19 0517 05/08/19 0530 05/09/19 0740 05/10/19 0306 05/12/19 0815  CRP <0.5  --  0.5 0.6 0.6 0.6  --   DDIMER 3.42*   < > 3.75* 5.30* 7.56* 7.85* 3.26*  PROCALCITON 0.24  --  0.50 0.23  --   --   --    < > = values in this interval not displayed.    Hepatic Function Latest Ref Rng & Units 05/10/2019 05/09/2019 05/08/2019  Total Protein 6.5 - 8.1 g/dL 5.5(L) 5.3(L) 5.0(L)  Albumin 3.5 - 5.0 g/dL 2.6(L) 2.6(L) 2.5(L)  AST 15 - 41 U/L 43(H) 43(H) 32  ALT 0 - 44 U/L 51(H) 48(H) 45(H)  Alk Phosphatase 38 - 126 U/L 60 64 70  Total Bilirubin 0.3 - 1.2 mg/dL 0.7 0.6 0.9  Bilirubin, Direct 0.0 - 0.2 mg/dL - - -   MSSA bacteremia - 1 set of blood cultures was positive . - repeat blood cultures the following day remain negative.   - There is no evidence of endocarditis on TTE.   -Antibiotics management per ID, recommendation for 81moe days of IV cefazolin and then 1 dose of long-acting oritavancin.                                 History of shingles.   - Currently stable does not take Valtrex anymore at home on a scheduled basis.   Borderline elevated D-dimer.   -She has elevated D-dimer despite being on moderately high dose of Lovenox, lower extremity ultrasound negative, patient does have exposure to hormonal contraceptive for which I will place her on 1 month of prophylactic dose Eliquis upon discharge.  Her risk for developing a  clot remains very high.  D-dimers continue trending up, will continue to monitor closely, if continues to increase will consider CTA chest to R/O PE.  Obesity.  BMI 37.  Follow with PCP.  Superimposed aspiration bacterial pneumonia.  -  Patient strictly counseled multiple times to have all meals in the chair, she tends to eat in bed laying flat, cleared by speech, 1/4 bottles growing MSSA, was initially on Unasyn but now switched to cefazolin IV, stable echocardiogram, procalcitonin is stable, CT was discussed with pulmonary, ID following.  Note patient also received Actemra for her severe COVID-19 infection which does predispose her to bacterial infections.  ID are following, currently he is on IV cefazolin, and to receive 1 dose of IV oritavancin tomorrow.      Condition - Extremely Guarded  Family Communication  : D/W patient brother.  Code Status : Full  Diet :   Diet Order            Diet regular Room service appropriate? Yes with Assist; Fluid consistency: Thin  Diet effective now               Disposition Plan  : Stay inpatient in the hospital for treatment of severe acute hypoxic respiratory failure due to COVID-19 pneumonia and bacterial aspiration pneumonia.  Finish treatment course, thereafter discharge home.  Consults  : CT  discussed with pulmonary on 05/06/2019. ID  Procedures  :    TTE -   1. Left ventricular ejection fraction, by estimation, is 65 to 70%. The left ventricle has normal function. The left ventricle has no  regional wall motion abnormalities. Left ventricular diastolic parameters are consistent with Grade I diastolic dysfunction (impaired relaxation).  2. Right ventricular systolic function is normal. The right ventricular size is normal.  3. The mitral valve is normal in structure. No evidence of mitral valve regurgitation. No evidence of mitral stenosis.  4. The aortic valve is normal in structure. Aortic valve regurgitation is not visualized.  No aortic stenosis is present.  5. The inferior vena cava is normal in size with greater than 50% respiratory variability, suggesting right atrial pressure of 3 mmHg.  Leg [place] - No DVT  CT - Extensive BILATERAL patchy airspace infiltrates consistent with multifocal pneumonia. Multiple cavitary foci are identified in both lungs, greater in the lower lobes; this is uncommon with COVID-19 alone, suggesting a superimposed cavitary pneumonia or sequela of aspiration pneumonia. Pneumomediastinum with gas extending into the cervical regions and less into the axilla as above; this is of uncertain etiology, could be related to barotrauma, coughing, mechanical ventilation.   PUD Prophylaxis : None  DVT Prophylaxis  :  Lovenox    Lab Results  Component Value Date   PLT 140 (L) 05/12/2019    Inpatient Medications  Scheduled Meds: . albuterol  2 puff Inhalation Q6H  . Chlorhexidine Gluconate Cloth  6 each Topical Q0600  . enoxaparin (LOVENOX) injection  60 mg Subcutaneous Q12H  . insulin aspart  0-15 Units Subcutaneous TID WC  . insulin aspart  0-5 Units Subcutaneous QHS  . insulin glargine  15 Units Subcutaneous Daily  . mouth rinse  15 mL Mouth Rinse BID  . multivitamin with minerals  1 tablet Oral Daily  . mupirocin ointment  1 application Nasal BID  . potassium chloride  40 mEq Oral Q6H   Continuous Infusions: .  ceFAZolin (ANCEF) IV 2 g (05/12/19 1423)  . [START ON 05/13/2019] oritavancin (ORBACTIV) IVPB     PRN Meds:.acetaminophen, guaiFENesin-dextromethorphan, menthol-cetylpyridinium, valACYclovir  Antibiotics  :    Anti-infectives (From admission, onward)   Start     Dose/Rate Route Frequency Ordered Stop   05/13/19 1200  Oritavancin Diphosphate (ORBACTIV) 1,200 mg in dextrose 5 % IVPB     1,200 mg 333.3 mL/hr over 180 Minutes Intravenous Once 05/09/19 1126     05/07/19 1400  ceFAZolin (ANCEF) IVPB 2g/100 mL premix     2 g 200 mL/hr over 30 Minutes Intravenous Every 8 hours  05/07/19 1244 05/13/19 1359   05/06/19 1200  Ampicillin-Sulbactam (UNASYN) 3 g in sodium chloride 0.9 % 100 mL IVPB  Status:  Discontinued     3 g 200 mL/hr over 30 Minutes Intravenous Every 8 hours 05/06/19 1029 05/07/19 1445   04/30/19 1000  remdesivir 100 mg in sodium chloride 0.9 % 100 mL IVPB     100 mg 200 mL/hr over 30 Minutes Intravenous Daily 04/29/19 0018 05/03/19 0832   04/29/19 1034  valACYclovir (VALTREX) tablet 500 mg    Note to Pharmacy: Patient taking differently: Take one tablet (500 mg) by mouth twice daily for one week - as needed for breakouts     500 mg Oral 2 times daily between meals and at bedtime PRN 04/29/19 0950     04/29/19 1000  remdesivir 100 mg in sodium chloride 0.9 % 100 mL IVPB  Status:  Discontinued     100 mg 200 mL/hr over 30 Minutes Intravenous Daily 04/28/19 1736 04/29/19 0018   04/29/19 0100  remdesivir 100 mg in sodium chloride 0.9 % 100 mL IVPB  100 mg 200 mL/hr over 30 Minutes Intravenous Every 30 min 04/29/19 0018 04/29/19 0700   04/28/19 1745  remdesivir 200 mg in sodium chloride 0.9% 250 mL IVPB  Status:  Discontinued     200 mg 580 mL/hr over 30 Minutes Intravenous Once 04/28/19 1736 04/29/19 0018       Erica [name] M.D on 05/12/2019 at 3:22 PM  To page go to [email or web address] - password [email or web address]  [facility] -  Office  [phone]    See all Orders from today for further details    Objective:   Vitals:   05/11/19 1845 05/11/19 2010 05/12/19 0418 05/12/19 1217  BP: 128/86 117/85 104/79 (!) 129/93  Pulse: 92 93 76 66  Resp: _0 Temp: 98.1 F (36.7 C) 98 F (36.7 C) 98.2 F (36.8 C) 98.4 F (36.9 C)  TempSrc: Oral Oral Oral Oral  SpO2: 100% 98% 97% 100%  Weight:      Height:        Wt Readings from Last 3 Encounters:  05/02/19 108 kg  01/24/19 106.1 kg  04/29/11 96.6 kg     Intake/Output Summary (Last 24 hours) at 05/12/2019 1522 Last data filed at 05/12/2019 0000 Gross per 24 hour  Intake 200 ml    Output --  Net 200 ml     Physical Exam  Awake Alert, Oriented X 3, No new F.N deficits, Normal affect Symmetrical Chest wall movement, Good air movement bilaterally, CTAB RRR,No Gallops,Rubs or new Murmurs, No Parasternal Heave +ve B.Sounds, Abd Soft, No tenderness, No rebound - guarding or rigidity. No Cyanosis, Clubbing or edema, No new Rash or bruise      Data Review:    CBC Recent Labs  Lab 05/06/19 0655 05/06/19 0655 05/07/19 0517 05/08/19 0530 05/09/19 0740 05/10/19 0306 05/12/19 0815  WBC 23.4*   < > 25.7* 16.3* 9.5 5.8 6.3  HGB 15.1*   < > 13.8 13.3 13.5 13.4 14.3  HCT 46.0   < > 42.3 39.8 40.7 40.5 43.8  PLT 332   < > 264 234 200 176 140*  MCV 90.4   < > 92.2 91.5 92.3 91.6 92.4  MCH 29.7   < > 30.1 30.6 30.6 30.3 30.2  MCHC 32.8   < > 32.6 33.4 33.2 33.1 32.6  RDW 14.0   < > 14.2 14.2 14.1 14.3 14.9  LYMPHSABS 2.0  --  2.8 2.8 1.6 1.8  --   MONOABS 0.8  --  1.0 0.8 0.7 0.6  --   EOSABS 0.2  --  0.3 0.1 0.1 0.1  --   BASOSABS 0.1  --  0.0 0.1 0.1 0.1  --    < > = values in this interval not displayed.    Chemistries  Recent Labs  Lab 05/07/19 0517 05/08/19 0530 05/09/19 0740 05/10/19 0306 05/12/19 0815  NA 141 141 141 141 139  K 3.8 3.6 3.5 3.8 3.4*  CL 106 106 109 108 107  CO2 _1 19*  GLUCOSE 109* 113* 112* 102* 114*  BUN 9 9 5* 6 8  CREATININE 1.04* 0.96 0.99 0.91 0.92  CALCIUM 7.8* 8.1* 8.0* 8.3* 8.5*  AST 38 32 43* 43*  --   ALT 51* 45* 48* 51*  --   ALKPHOS 82 70 64 60  --   BILITOT 1.0 0.9 0.6 0.7  --   MG 2.2 2.2 2.1 2.2  --      ------------------------------------------------------------------------------------------------------------------ No results for  input(s): CHOL, HDL, LDLCALC, TRIG, CHOLHDL, LDLDIRECT in the last 72 hours.  Lab Results  Component Value Date   HGBA1C 6.2 (H) 04/28/2019   ------------------------------------------------------------------------------------------------------------------ No  results for input(s): TSH, T4TOTAL, T3FREE, THYROIDAB in the last 72 hours.  Invalid input(s): FREET3  Cardiac Enzymes No results for input(s): CKMB, TROPONINI, MYOGLOBIN in the last 168 hours.  Invalid input(s): CK ------------------------------------------------------------------------------------------------------------------    Component Value Date/Time   BNP 45.8 05/03/2019 0344    Micro Results Recent Results (from the past 240 hour(s))  Culture, blood (routine x 2)     Status: None   Collection Time: 05/06/19 10:44 AM   Specimen: BLOOD  Result Value Ref Range Status   Specimen Description BLOOD SITE NOT SPECIFIED  Final   Special Requests   Final    BOTTLES DRAWN AEROBIC ONLY Blood Culture results may not be optimal due to an inadequate volume of blood received in culture bottles   Culture   Final    NO GROWTH 5 DAYS Performed at [facility], [place] [address], [place], [place] [address]    Report Status 05/11/2019 FINAL  Final  Culture, blood (routine x 2)     Status: Abnormal   Collection Time: 05/06/19 10:47 AM   Specimen: BLOOD RIGHT ARM  Result Value Ref Range Status   Specimen Description BLOOD RIGHT ARM  Final   Special Requests   Final    BOTTLES DRAWN AEROBIC AND ANAEROBIC Blood Culture adequate volume   Culture  Setup Time   Final    GRAM POSITIVE COCCI IN CLUSTERS ANAEROBIC BOTTLE ONLY CRITICAL RESULT CALLED TO, READ BACK BY AND VERIFIED WITH: TJaneice Robinson PHARMD, AT 1059 05/07/19 BY D. VANHOOK Performed at [facility], [place] [address], [place], [place] [address]    Culture STAPHYLOCOCCUS AUREUS (A)  Final   Report Status 05/09/2019 FINAL  Final   Organism ID, Bacteria STAPHYLOCOCCUS AUREUS  Final      Susceptibility   Staphylococcus aureus - MIC*    CIPROFLOXACIN <=0.5 SENSITIVE Sensitive     ERYTHROMYCIN <=0.25 SENSITIVE Sensitive     GENTAMICIN <=0.5 SENSITIVE Sensitive     OXACILLIN <=0.25 SENSITIVE Sensitive     TETRACYCLINE <=1  SENSITIVE Sensitive     VANCOMYCIN 1 SENSITIVE Sensitive     TRIMETH/SULFA <=10 SENSITIVE Sensitive     CLINDAMYCIN <=0.25 SENSITIVE Sensitive     RIFAMPIN <=0.5 SENSITIVE Sensitive     Inducible Clindamycin NEGATIVE Sensitive     * STAPHYLOCOCCUS AUREUS  Blood Culture ID Panel (Reflexed)     Status: Abnormal   Collection Time: 05/06/19 10:47 AM  Result Value Ref Range Status   Enterococcus species NOT DETECTED NOT DETECTED Final   Listeria monocytogenes NOT DETECTED NOT DETECTED Final   Staphylococcus species DETECTED (A) NOT DETECTED Final    Comment: CRITICAL RESULT CALLED TO, READ BACK BY AND VERIFIED WITH: T. BAUMEISTER PHARMD, AT 1059 05/07/19 BY D. VANHOOK    Staphylococcus aureus (BCID) DETECTED (A) NOT DETECTED Final    Comment: Methicillin (oxacillin) susceptible Staphylococcus aureus (MSSA). Preferred therapy is anti staphylococcal beta lactam antibiotic (Cefazolin or Nafcillin), unless clinically contraindicated. CRITICAL RESULT CALLED TO, READ BACK BY AND VERIFIED WITH: T. BAUMEISTER PHARMD, AT 1059 05/07/19 BY D. VANHOOK    Methicillin resistance NOT DETECTED NOT DETECTED Final   Streptococcus species NOT DETECTED NOT DETECTED Final   Streptococcus agalactiae NOT DETECTED NOT DETECTED Final   Streptococcus pneumoniae NOT DETECTED NOT DETECTED Final  Streptococcus pyogenes NOT DETECTED NOT DETECTED Final   Acinetobacter baumannii NOT DETECTED NOT DETECTED Final   Enterobacteriaceae species NOT DETECTED NOT DETECTED Final   Enterobacter cloacae complex NOT DETECTED NOT DETECTED Final   Escherichia coli NOT DETECTED NOT DETECTED Final   Klebsiella oxytoca NOT DETECTED NOT DETECTED Final   Klebsiella pneumoniae NOT DETECTED NOT DETECTED Final   Proteus species NOT DETECTED NOT DETECTED Final   Serratia marcescens NOT DETECTED NOT DETECTED Final   Haemophilus influenzae NOT DETECTED NOT DETECTED Final   Neisseria meningitidis NOT DETECTED NOT DETECTED Final   Pseudomonas  aeruginosa NOT DETECTED NOT DETECTED Final   Candida albicans NOT DETECTED NOT DETECTED Final   Candida glabrata NOT DETECTED NOT DETECTED Final   Candida krusei NOT DETECTED NOT DETECTED Final   Candida parapsilosis NOT DETECTED NOT DETECTED Final   Candida tropicalis NOT DETECTED NOT DETECTED Final    Comment: Performed at [facility], [place] [address], [place], [place] [address]  MRSA PCR Screening     Status: Abnormal   Collection Time: 05/06/19 10:59 AM   Specimen: Nasal Mucosa; Nasopharyngeal  Result Value Ref Range Status   MRSA by PCR POSITIVE (A) NEGATIVE Final    Comment:        The GeneXpert MRSA Assay (FDA approved for NASAL specimens only), is one component of a comprehensive MRSA colonization surveillance program. It is not intended to diagnose MRSA infection nor to guide or monitor treatment for MRSA infections. CRITICAL RESULT CALLED TO, READ BACK BY AND VERIFIED WITH: RN Salley Slaughter [id number] AT 1414 BY CM Performed at [facility], [place] [address], [place], [place] [address]   Expectorated sputum assessment w rflx to resp cult     Status: None   Collection Time: 05/06/19  1:00 PM   Specimen: Expectorated Sputum  Result Value Ref Range Status   Specimen Description Expect. Sput  Final   Special Requests Normal  Final   Sputum evaluation   Final    THIS SPECIMEN IS ACCEPTABLE FOR SPUTUM CULTURE Performed at [facility], [place] [address], [place], [place] [address]    Report Status 05/06/2019 FINAL  Final  Culture, respiratory     Status: None   Collection Time: 05/06/19  1:00 PM  Result Value Ref Range Status   Specimen Description Expect. Sput  Final   Special Requests Normal Reflexed from [id number]  Final   Gram Stain   Final    RARE WBC PRESENT, PREDOMINANTLY PMN FEW GRAM POSITIVE COCCI IN PAIRS FEW GRAM NEGATIVE RODS FEW GRAM POSITIVE RODS    Culture   Final    ABUNDANT Consistent with normal respiratory flora. Performed at [facility], [place] [address] [place], [place] [address]    Report Status 05/08/2019 FINAL  Final  Culture, blood (routine x 2)     Status: None   Collection Time: 05/07/19  9:02 PM   Specimen: BLOOD RIGHT ARM  Result Value Ref Range Status   Specimen Description BLOOD RIGHT ARM  Final   Special Requests   Final    BOTTLES DRAWN AEROBIC ONLY Blood Culture results may not be optimal due to an inadequate volume of blood received in culture bottles   Culture   Final    NO GROWTH 5 DAYS Performed at [facility], [place] [address] [place],  [address]    Report Status 05/12/2019 FINAL  Final  Culture, blood (routine x 2)  Status: None   Collection Time: 05/07/19  9:02 PM   Specimen: BLOOD RIGHT ARM  Result Value Ref Range Status   Specimen Description BLOOD RIGHT ARM  Final   Special Requests   Final    BOTTLES DRAWN AEROBIC ONLY Blood Culture adequate volume   Culture   Final    NO GROWTH 5 DAYS Performed at [facility], [address] [place], [place] [address]    Report Status 05/12/2019 FINAL  Final    Radiology Reports CT CHEST WO CONTRAST  Result Date: 05/06/2019 CLINICAL DATA:  Chest pain, shortness of breath, pneumomediastinum, acute hypoxic respiratory failure due to COVID-19 viral pneumonia EXAM: CT CHEST WITHOUT CONTRAST TECHNIQUE: Multidetector CT imaging of the chest was performed following the standard protocol without IV contrast. Sagittal and coronal MPR images reconstructed from axial data set. COMPARISON:  None FINDINGS: Cardiovascular: Aorta normal caliber. Heart size normal. No pericardial effusion. Mediastinum/Nodes: Small hiatal hernia. Scattered pneumomediastinum few normal size mediastinal lymph nodes without thoracic adenopathy. Additional soft tissue gas is seen extending into the axilla and cervical regions bilaterally. Lungs/Pleura: Patchy airspace infiltrates throughout both lungs consistent with multifocal pneumonia and history of COVID-19.  Multiple small cavitary foci are identified within the infiltrates in both lungs, greater in lower lobes. No pleural effusion or pneumothorax. No discrete pulmonary mass/nodule. Upper Abdomen: Visualized upper abdomen unremarkable Musculoskeletal: Osseous structures unremarkable. IMPRESSION: Extensive BILATERAL patchy airspace infiltrates consistent with multifocal pneumonia. Multiple cavitary foci are identified in both lungs, greater in the lower lobes; this is uncommon with COVID-19 alone, suggesting a superimposed cavitary pneumonia or sequela of aspiration pneumonia. Pneumomediastinum with gas extending into the cervical regions and less into the axilla as above; this is of uncertain etiology, could be related to barotrauma, coughing, mechanical ventilation. Electronically Signed   By: Lavonia Dana M.D.   On: 05/06/2019 10:11   DG Chest Port 1 View  Result Date: 05/05/2019 CLINICAL DATA:  Increasing shortness of breath. EXAM: PORTABLE CHEST 1 VIEW COMPARISON:  Chest radiograph 05/03/2019 FINDINGS: There is pneumomediastinum and subcutaneous emphysema throughout the soft tissues of the visualized neck base. There are worsening bilateral diffuse airspace opacities. There is more focal consolidation in the right peripheral lung. No significant pleural effusion. Query tiny right apical pneumothorax. IMPRESSION: 1. Pneumomediastinum and subcutaneous emphysema throughout the soft tissues of the visualized neck base. 2. Interval worsening of bilateral diffuse airspace opacities. 3. Query tiny right apical pneumothorax. Electronically Signed   By: Audie [place] M.D.   On: 05/05/2019 16:31   DG Chest Port 1 View  Result Date: 05/03/2019 CLINICAL DATA:  Shortness of breath, COVID EXAM: PORTABLE CHEST 1 VIEW COMPARISON:  04/28/2019 FINDINGS: Cardiomegaly. There is interval increase in heterogeneous bilateral, bibasilar predominant airspace opacity. IMPRESSION: Interval increase in heterogeneous bilateral,  bibasilar predominant airspace opacity, consistent with worsened multifocal infection and in keeping with COVID airspace disease. Electronically Signed   By: Eddie Candle M.D.   On: 05/03/2019 10:51   DG Chest Port 1 View  Result Date: 04/28/2019 CLINICAL DATA:  COVID-19 positive, short of breath, hypoxia EXAM: PORTABLE CHEST 1 VIEW COMPARISON:  None. FINDINGS: Single frontal view of the chest demonstrates an unremarkable cardiac silhouette. Diffuse interstitial prominence with patchy bilateral ground-glass opacities greatest at the lung bases. No effusion or pneumothorax. No acute bony abnormalities. IMPRESSION: 1. Multifocal pneumonia compatible with COVID-19.  The Electronically Signed   By: Randa Ngo M.D.   On: 04/28/2019 16:47   VAS [place] LOWER EXTREMITY  VENOUS (DVT)  Result Date: 05/03/2019  Lower Venous DVTStudy Indications: Edema, and Swelling.  Comparison Study: no prior Performing Technologist: Abram Sander RVS  Examination Guidelines: A complete evaluation includes B-mode imaging, spectral Doppler, color Doppler, and power Doppler as needed of all accessible portions of each vessel. Bilateral testing is considered an integral part of a complete examination. Limited examinations for reoccurring indications may be performed as noted. The reflux portion of the exam is performed with the patient in reverse Trendelenburg.  +---------+---------------+---------+-----------+----------+--------------+ RIGHT    CompressibilityPhasicitySpontaneityPropertiesThrombus Aging +---------+---------------+---------+-----------+----------+--------------+ CFV      Full           Yes      Yes                                 +---------+---------------+---------+-----------+----------+--------------+ SFJ      Full                                                        +---------+---------------+---------+-----------+----------+--------------+ FV Prox  Full                                                         +---------+---------------+---------+-----------+----------+--------------+ FV Mid   Full                                                        +---------+---------------+---------+-----------+----------+--------------+ FV DistalFull                                                        +---------+---------------+---------+-----------+----------+--------------+ PFV      Full                                                        +---------+---------------+---------+-----------+----------+--------------+ POP      Full           Yes      Yes                                 +---------+---------------+---------+-----------+----------+--------------+ PTV      Full                                                        +---------+---------------+---------+-----------+----------+--------------+ PERO  Not visualized +---------+---------------+---------+-----------+----------+--------------+   +---------+---------------+---------+-----------+----------+--------------+ LEFT     CompressibilityPhasicitySpontaneityPropertiesThrombus Aging +---------+---------------+---------+-----------+----------+--------------+ CFV      Full           Yes      Yes                                 +---------+---------------+---------+-----------+----------+--------------+ SFJ      Full                                                        +---------+---------------+---------+-----------+----------+--------------+ FV Prox  Full                                                        +---------+---------------+---------+-----------+----------+--------------+ FV Mid   Full                                                        +---------+---------------+---------+-----------+----------+--------------+ FV DistalFull                                                         +---------+---------------+---------+-----------+----------+--------------+ PFV      Full                                                        +---------+---------------+---------+-----------+----------+--------------+ POP      Full           Yes      Yes                                 +---------+---------------+---------+-----------+----------+--------------+ PTV      Full                                                        +---------+---------------+---------+-----------+----------+--------------+ PERO                                                  Not visualized +---------+---------------+---------+-----------+----------+--------------+     Summary: BILATERAL: - No evidence of deep vein thrombosis seen in the lower extremities, bilaterally.   *See table(s) above for measurements and observations. Electronically signed by Curt Jews MD on 05/03/2019 at 4:26:06 PM.    Final    ECHOCARDIOGRAM LIMITED  Result  Date: 05/07/2019    ECHOCARDIOGRAM LIMITED REPORT   Patient Name:   Kahlea JAMONI BROADFOOT Date of Exam: 05/07/2019 Medical Rec #:  [id number]    Height:       65.0 in Accession #:    [id number]   Weight:       238.1 lb Date of Birth:  [birth year]    BSA:          2.130 m Patient Age:    89 years     BP:           112/61 mmHg Patient Gender: F            HR:           84 bpm. Exam Location:  Inpatient Procedure: Limited Echo, Cardiac Doppler and Color Doppler Indications:    CHF  History:        Patient has no prior history of Echocardiogram examinations.                 Signs/Symptoms:Shortness of Breath. COVID POSITIVE, pneumonia,                 Obesity.  Sonographer:    Dustin Flock Referring Phys: [place]  1. Left ventricular ejection fraction, by estimation, is 65 to 70%. The left ventricle has normal function. The left ventricle has no regional wall motion abnormalities. Left ventricular diastolic parameters are consistent with Grade I diastolic  dysfunction (impaired relaxation).  2. Right ventricular systolic function is normal. The right ventricular size is normal.  3. The mitral valve is normal in structure. No evidence of mitral valve regurgitation. No evidence of mitral stenosis.  4. The aortic valve is normal in structure. Aortic valve regurgitation is not visualized. No aortic stenosis is present.  5. The inferior vena cava is normal in size with greater than 50% respiratory variability, suggesting right atrial pressure of 3 mmHg. FINDINGS  Left Ventricle: Left ventricular ejection fraction, by estimation, is 65 to 70%. The left ventricle has normal function. The left ventricle has no regional wall motion abnormalities. The left ventricular internal cavity size was normal in size. There is  no left ventricular hypertrophy. Right Ventricle: The right ventricular size is normal. No increase in right ventricular wall thickness. Right ventricular systolic function is normal. Left Atrium: Left atrial size was normal in size. Right Atrium: Right atrial size was normal in size. Pericardium: There is no evidence of pericardial effusion. Mitral Valve: The mitral valve is normal in structure. Normal mobility of the mitral valve leaflets. No evidence of mitral valve stenosis. Tricuspid Valve: The tricuspid valve is normal in structure. Tricuspid valve regurgitation is not demonstrated. No evidence of tricuspid stenosis. Aortic Valve: The aortic valve is normal in structure. Aortic valve regurgitation is not visualized. No aortic stenosis is present. Pulmonic Valve: The pulmonic valve was normal in structure. Pulmonic valve regurgitation is not visualized. No evidence of pulmonic stenosis. Aorta: The aortic root is normal in size and structure. Venous: The inferior vena cava is normal in size with greater than 50% respiratory variability, suggesting right atrial pressure of 3 mmHg. IAS/Shunts: No atrial level shunt detected by color flow Doppler.  LEFT VENTRICLE  PLAX 2D LVIDd:         4.00 cm  Diastology LVIDs:         2.55 cm  LV e' lateral:   7.40 cm/s LV PW:         0.97 cm  LV  E/e' lateral: 7.2 LV IVS:        1.03 cm  LV e' medial:    5.33 cm/s LVOT diam:     1.70 cm  LV E/e' medial:  10.0 LVOT Area:     2.27 cm  RIGHT VENTRICLE RV S prime:     5.11 cm/s LEFT ATRIUM         Index LA diam:    2.80 cm 1.31 cm/m   AORTA Ao Root diam: 2.10 cm MITRAL VALVE MV Area (PHT): 3.48 cm    SHUNTS MV Decel Time: 218 msec    Systemic Diam: 1.70 cm MV E velocity: 53.10 cm/s MV A velocity: 63.00 cm/s MV E/A ratio:  0.84 Candee Furbish MD Electronically signed by Candee Furbish MD Signature Date/Time: 05/07/2019/3:18:45 PM    Final                                                                        PROGRESS NOTE                                                                                                                                                                                                             Patient Demographics:    Erica [name], is a 48 y.o. female, DOB - 12/30/1970, VVO:[id number]  Outpatient Primary MD for the patient is Alroy Dust, L.Marlou Sa, MD    LOS - 14  Admit date - 04/28/2019    Chief Complaint  Patient presents with  . COVID  . Shortness of Breath       Brief Narrative    - Erica [name] is a 49 y.o. female with no significant past medical history presents the emergency department today with increasing short of breath.  Her symptoms started on 04/20/2019, initially was only feeling tired but no short of breath.  She had recently tested positive on 04/21/2019 for COVID-19 pneumonia and was placed on oral steroid treatment without much benefit, came to the ER with shortness of breath and was diagnosed with acute hypoxic respiratory failure due to COVID-19 pneumonia and admitted to the hospital with 6 L nasal cannula oxygen requirement.  She was treated promptly with IV steroids, remdesivir and Actemra, unfortunately her course was complicated by  development of pneumomediastinum and small  pneumothorax which has contained, she has developed aspiration bacterial pneumonia for which ID is following the patient.  She has 1 out of 4 blood cultures positive for MSSA bacteremia.   Subjective:   Patient in bed, appears comfortable, denies any headache, no fever, no chest pain or pressure, she still reporting cough, with productive yellow phlegm.   Assessment  & Plan :    Acute Hypoxic Resp. Failure due to Acute Covid 19 Viral Pneumonitis during the ongoing 2020 Covid 19 Pandemic  - she has severe disease and was treated promptly after appropriate consents with IV Actemra on 04/29/2019 along with steroids and Remdesivir, she has shown slow but definite improvement, currently room air at rest, down from 15L/ minute. -Unfortunately she has developed a small pneumothorax and pneumomediastinum which will be monitored, also signs of possible aspiration pneumonia .  Encouraged the patient to sit up in chair in the daytime use I-S and flutter valve for pulmonary toiletry and then prone in bed when at night.  Will advance activity and titrate down oxygen as possible.  SpO2: 100 % O2 Flow Rate (L/min): 2 L/min   Recent Labs  Lab 05/06/19 0655 05/06/19 0655 05/07/19 0517 05/08/19 0530 05/09/19 0740 05/10/19 0306 05/12/19 0815  CRP <0.5  --  0.5 0.6 0.6 0.6  --   DDIMER 3.42*   < > 3.75* 5.30* 7.56* 7.85* 3.26*  PROCALCITON 0.24  --  0.50 0.23  --   --   --    < > = values in this interval not displayed.    Hepatic Function Latest Ref Rng & Units 05/10/2019 05/09/2019 05/08/2019  Total Protein 6.5 - 8.1 g/dL 5.5(L) 5.3(L) 5.0(L)  Albumin 3.5 - 5.0 g/dL 2.6(L) 2.6(L) 2.5(L)  AST 15 - 41 U/L 43(H) 43(H) 32  ALT 0 - 44 U/L 51(H) 48(H) 45(H)  Alk Phosphatase 38 - 126 U/L 60 64 70  Total Bilirubin 0.3 - 1.2 mg/dL 0.7 0.6 0.9  Bilirubin, Direct 0.0 - 0.2 mg/dL - - -   MSSA bacteremia - 1 set of blood cultures was positive . - repeat blood cultures  the following day remain negative.   - There is no evidence of endocarditis on TTE.   -Antibiotics management per ID, recommendation for 2 more days of IV cefazolin and then 1 dose of long-acting oritavancin.                                 History of shingles.   - Currently stable does not take Valtrex anymore at home on a scheduled basis.   Borderline elevated D-dimer.   -She has elevated D-dimer despite being on moderately high dose of Lovenox, lower extremity ultrasound negative, patient does have exposure to hormonal contraceptive for which I will place her on 1 month of prophylactic dose Eliquis upon discharge.  Her risk for developing a clot remains very high.  D-dimers continue trending up, will continue to monitor closely, if continues to increase will consider CTA chest to R/O PE.  Obesity.  BMI 37.  Follow with PCP.  Superimposed aspiration bacterial pneumonia.  -  Patient strictly counseled multiple times to have all meals in the chair, she tends to eat in bed laying flat, cleared by speech, 1/4 bottles growing MSSA, was initially on Unasyn but now switched to cefazolin IV, stable echocardiogram, procalcitonin is stable, CT was discussed with pulmonary, ID following.  Note patient also received Actemra  for her severe COVID-19 infection which does predispose her to bacterial infections.  ID following defer course of antibiotic treatment to ID at this point.    Condition - Extremely Guarded  Family Communication  : D/W patient  Code Status : Full  Diet :   Diet Order            Diet regular Room service appropriate? Yes with Assist; Fluid consistency: Thin  Diet effective now               Disposition Plan  : Stay inpatient in the hospital for treatment of severe acute hypoxic respiratory failure due to COVID-19 pneumonia and bacterial aspiration pneumonia.  Finish treatment course, thereafter discharge home.  Consults  : CT  discussed with pulmonary on 05/06/2019.  ID  Procedures  :    TTE -   1. Left ventricular ejection fraction, by estimation, is 65 to 70%. The left ventricle has normal function. The left ventricle has no regional wall motion abnormalities. Left ventricular diastolic parameters are consistent with Grade I diastolic dysfunction (impaired relaxation).  2. Right ventricular systolic function is normal. The right ventricular size is normal.  3. The mitral valve is normal in structure. No evidence of mitral valve regurgitation. No evidence of mitral stenosis.  4. The aortic valve is normal in structure. Aortic valve regurgitation is not visualized. No aortic stenosis is present.  5. The inferior vena cava is normal in size with greater than 50% respiratory variability, suggesting right atrial pressure of 3 mmHg.  Leg [place] - No DVT  CT - Extensive BILATERAL patchy airspace infiltrates consistent with multifocal pneumonia. Multiple cavitary foci are identified in both lungs, greater in the lower lobes; this is uncommon with COVID-19 alone, suggesting a superimposed cavitary pneumonia or sequela of aspiration pneumonia. Pneumomediastinum with gas extending into the cervical regions and less into the axilla as above; this is of uncertain etiology, could be related to barotrauma, coughing, mechanical ventilation.   PUD Prophylaxis : None  DVT Prophylaxis  :  Lovenox    Lab Results  Component Value Date   PLT 140 (L) 05/12/2019    Inpatient Medications  Scheduled Meds: . albuterol  2 puff Inhalation Q6H  . Chlorhexidine Gluconate Cloth  6 each Topical Q0600  . enoxaparin (LOVENOX) injection  60 mg Subcutaneous Q12H  . insulin aspart  0-15 Units Subcutaneous TID WC  . insulin aspart  0-5 Units Subcutaneous QHS  . insulin glargine  15 Units Subcutaneous Daily  . mouth rinse  15 mL Mouth Rinse BID  . multivitamin with minerals  1 tablet Oral Daily  . mupirocin ointment  1 application Nasal BID  . potassium chloride  40 mEq Oral  Q6H   Continuous Infusions: .  ceFAZolin (ANCEF) IV 2 g (05/12/19 1423)  . [START ON 05/13/2019] oritavancin (ORBACTIV) IVPB     PRN Meds:.acetaminophen, guaiFENesin-dextromethorphan, menthol-cetylpyridinium, valACYclovir  Antibiotics  :    Anti-infectives (From admission, onward)   Start     Dose/Rate Route Frequency Ordered Stop   05/13/19 1200  Oritavancin Diphosphate (ORBACTIV) 1,200 mg in dextrose 5 % IVPB     1,200 mg 333.3 mL/hr over 180 Minutes Intravenous Once 05/09/19 1126     05/07/19 1400  ceFAZolin (ANCEF) IVPB 2g/100 mL premix     2 g 200 mL/hr over 30 Minutes Intravenous Every 8 hours 05/07/19 1244 05/13/19 1359   05/06/19 1200  Ampicillin-Sulbactam (UNASYN) 3 g in sodium chloride 0.9 % 100 mL IVPB  Status:  Discontinued     3 g 200 mL/hr over 30 Minutes Intravenous Every 8 hours 05/06/19 1029 05/07/19 1445   04/30/19 1000  remdesivir 100 mg in sodium chloride 0.9 % 100 mL IVPB     100 mg 200 mL/hr over 30 Minutes Intravenous Daily 04/29/19 0018 05/03/19 0832   04/29/19 1034  valACYclovir (VALTREX) tablet 500 mg    Note to Pharmacy: Patient taking differently: Take one tablet (500 mg) by mouth twice daily for one week - as needed for breakouts     500 mg Oral 2 times daily between meals and at bedtime PRN 04/29/19 0950     04/29/19 1000  remdesivir 100 mg in sodium chloride 0.9 % 100 mL IVPB  Status:  Discontinued     100 mg 200 mL/hr over 30 Minutes Intravenous Daily 04/28/19 1736 04/29/19 0018   04/29/19 0100  remdesivir 100 mg in sodium chloride 0.9 % 100 mL IVPB     100 mg 200 mL/hr over 30 Minutes Intravenous Every 30 min 04/29/19 0018 04/29/19 0700   04/28/19 1745  remdesivir 200 mg in sodium chloride 0.9% 250 mL IVPB  Status:  Discontinued     200 mg 580 mL/hr over 30 Minutes Intravenous Once 04/28/19 1736 04/29/19 0018       Syon Tews M.D on 05/12/2019 at 3:22 PM  To page go to [email or web address] - password [facility]  [facility] -  Office   [phone]    See all Orders from today for further details    Objective:   Vitals:   05/11/19 1845 05/11/19 2010 05/12/19 0418 05/12/19 1217  BP: 128/86 117/85 104/79 (!) 129/93  Pulse: 92 93 76 66  Resp: _0 Temp: 98.1 F (36.7 C) 98 F (36.7 C) 98.2 F (36.8 C) 98.4 F (36.9 C)  TempSrc: Oral Oral Oral Oral  SpO2: 100% 98% 97% 100%  Weight:      Height:        Wt Readings from Last 3 Encounters:  05/02/19 108 kg  01/24/19 106.1 kg  04/29/11 96.6 kg     Intake/Output Summary (Last 24 hours) at 05/12/2019 1522 Last data filed at 05/12/2019 0000 Gross per 24 hour  Intake 200 ml  Output --  Net 200 ml     Physical Exam  Awake Alert, Oriented X 3, No new F.N deficits, Normal affect Symmetrical Chest wall movement, Good air movement bilaterally, CTAB RRR,No Gallops,Rubs or new Murmurs, No Parasternal Heave +ve B.Sounds, Abd Soft, No tenderness, No rebound - guarding or rigidity. No Cyanosis, Clubbing or edema, No new Rash or bruise      Data Review:    CBC Recent Labs  Lab 05/06/19 0655 05/06/19 0655 05/07/19 0517 05/08/19 0530 05/09/19 0740 05/10/19 0306 05/12/19 0815  WBC 23.4*   < > 25.7* 16.3* 9.5 5.8 6.3  HGB 15.1*   < > 13.8 13.3 13.5 13.4 14.3  HCT 46.0   < > 42.3 39.8 40.7 40.5 43.8  PLT 332   < > 264 234 200 176 140*  MCV 90.4   < > 92.2 91.5 92.3 91.6 92.4  MCH 29.7   < > 30.1 30.6 30.6 30.3 30.2  MCHC 32.8   < > 32.6 33.4 33.2 33.1 32.6  RDW 14.0   < > 14.2 14.2 14.1 14.3 14.9  LYMPHSABS 2.0  --  2.8 2.8 1.6 1.8  --   MONOABS 0.8  --  1.0 0.8 0.7 0.6  --  EOSABS 0.2  --  0.3 0.1 0.1 0.1  --   BASOSABS 0.1  --  0.0 0.1 0.1 0.1  --    < > = values in this interval not displayed.    Chemistries  Recent Labs  Lab 05/07/19 0517 05/08/19 0530 05/09/19 0740 05/10/19 0306 05/12/19 0815  NA 141 141 141 141 139  K 3.8 3.6 3.5 3.8 3.4*  CL 106 106 109 108 107  CO2 _0 19*  GLUCOSE 109* 113* 112* 102* 114*  BUN 9 9  5* 6 8  CREATININE 1.04* 0.96 0.99 0.91 0.92  CALCIUM 7.8* 8.1* 8.0* 8.3* 8.5*  AST 38 32 43* 43*  --   ALT 51* 45* 48* 51*  --   ALKPHOS 82 70 64 60  --   BILITOT 1.0 0.9 0.6 0.7  --   MG 2.2 2.2 2.1 2.2  --      ------------------------------------------------------------------------------------------------------------------ No results for input(s): CHOL, HDL, LDLCALC, TRIG, CHOLHDL, LDLDIRECT in the last 72 hours.  Lab Results  Component Value Date   HGBA1C 6.2 (H) 04/28/2019   ------------------------------------------------------------------------------------------------------------------ No results for input(s): TSH, T4TOTAL, T3FREE, THYROIDAB in the last 72 hours.  Invalid input(s): FREET3  Cardiac Enzymes No results for input(s): CKMB, TROPONINI, MYOGLOBIN in the last 168 hours.  Invalid input(s): CK ------------------------------------------------------------------------------------------------------------------    Component Value Date/Time   BNP 45.8 05/03/2019 0344    Micro Results Recent Results (from the past 240 hour(s))  Culture, blood (routine x 2)     Status: None   Collection Time: 05/06/19 10:44 AM   Specimen: BLOOD  Result Value Ref Range Status   Specimen Description BLOOD SITE NOT SPECIFIED  Final   Special Requests   Final    BOTTLES DRAWN AEROBIC ONLY Blood Culture results may not be optimal due to an inadequate volume of blood received in culture bottles   Culture   Final    NO GROWTH 5 DAYS Performed at [facility], [place] [address], [place], [place] [address]    Report Status 05/11/2019 FINAL  Final  Culture, blood (routine x 2)     Status: Abnormal   Collection Time: 05/06/19 10:47 AM   Specimen: BLOOD RIGHT ARM  Result Value Ref Range Status   Specimen Description BLOOD RIGHT ARM  Final   Special Requests   Final    BOTTLES DRAWN AEROBIC AND ANAEROBIC Blood Culture adequate volume   Culture  Setup Time   Final    GRAM POSITIVE  COCCI IN CLUSTERS ANAEROBIC BOTTLE ONLY CRITICAL RESULT CALLED TO, READ BACK BY AND VERIFIED WITH: TJaneice Robinson PHARMD, AT 1059 05/07/19 BY D. VANHOOK Performed at [facility], [place] [address], [place], [place] [address]    Culture STAPHYLOCOCCUS AUREUS (A)  Final   Report Status 05/09/2019 FINAL  Final   Organism ID, Bacteria STAPHYLOCOCCUS AUREUS  Final      Susceptibility   Staphylococcus aureus - MIC*    CIPROFLOXACIN <=0.5 SENSITIVE Sensitive     ERYTHROMYCIN <=0.25 SENSITIVE Sensitive     GENTAMICIN <=0.5 SENSITIVE Sensitive     OXACILLIN <=0.25 SENSITIVE Sensitive     TETRACYCLINE <=1 SENSITIVE Sensitive     VANCOMYCIN 1 SENSITIVE Sensitive     TRIMETH/SULFA <=10 SENSITIVE Sensitive     CLINDAMYCIN <=0.25 SENSITIVE Sensitive     RIFAMPIN <=0.5 SENSITIVE Sensitive     Inducible Clindamycin NEGATIVE Sensitive     * STAPHYLOCOCCUS AUREUS  Blood Culture ID  Panel (Reflexed)     Status: Abnormal   Collection Time: 05/06/19 10:47 AM  Result Value Ref Range Status   Enterococcus species NOT DETECTED NOT DETECTED Final   Listeria monocytogenes NOT DETECTED NOT DETECTED Final   Staphylococcus species DETECTED (A) NOT DETECTED Final    Comment: CRITICAL RESULT CALLED TO, READ BACK BY AND VERIFIED WITH: T. BAUMEISTER PHARMD, AT 1059 05/07/19 BY D. VANHOOK    Staphylococcus aureus (BCID) DETECTED (A) NOT DETECTED Final    Comment: Methicillin (oxacillin) susceptible Staphylococcus aureus (MSSA). Preferred therapy is anti staphylococcal beta lactam antibiotic (Cefazolin or Nafcillin), unless clinically contraindicated. CRITICAL RESULT CALLED TO, READ BACK BY AND VERIFIED WITH: T. BAUMEISTER PHARMD, AT 1059 05/07/19 BY D. VANHOOK    Methicillin resistance NOT DETECTED NOT DETECTED Final   Streptococcus species NOT DETECTED NOT DETECTED Final   Streptococcus agalactiae NOT DETECTED NOT DETECTED Final   Streptococcus pneumoniae NOT DETECTED NOT DETECTED Final   Streptococcus pyogenes  NOT DETECTED NOT DETECTED Final   Acinetobacter baumannii NOT DETECTED NOT DETECTED Final   Enterobacteriaceae species NOT DETECTED NOT DETECTED Final   Enterobacter cloacae complex NOT DETECTED NOT DETECTED Final   Escherichia coli NOT DETECTED NOT DETECTED Final   Klebsiella oxytoca NOT DETECTED NOT DETECTED Final   Klebsiella pneumoniae NOT DETECTED NOT DETECTED Final   Proteus species NOT DETECTED NOT DETECTED Final   Serratia marcescens NOT DETECTED NOT DETECTED Final   Haemophilus influenzae NOT DETECTED NOT DETECTED Final   Neisseria meningitidis NOT DETECTED NOT DETECTED Final   Pseudomonas aeruginosa NOT DETECTED NOT DETECTED Final   Candida albicans NOT DETECTED NOT DETECTED Final   Candida glabrata NOT DETECTED NOT DETECTED Final   Candida krusei NOT DETECTED NOT DETECTED Final   Candida parapsilosis NOT DETECTED NOT DETECTED Final   Candida tropicalis NOT DETECTED NOT DETECTED Final    Comment: Performed at [facility], [place]. [address], [place], [place] [address]  MRSA PCR Screening     Status: Abnormal   Collection Time: 05/06/19 10:59 AM   Specimen: Nasal Mucosa; Nasopharyngeal  Result Value Ref Range Status   MRSA by PCR POSITIVE (A) NEGATIVE Final    Comment:        The GeneXpert MRSA Assay (FDA approved for NASAL specimens only), is one component of a comprehensive MRSA colonization surveillance program. It is not intended to diagnose MRSA infection nor to guide or monitor treatment for MRSA infections. CRITICAL RESULT CALLED TO, READ BACK BY AND VERIFIED WITH: RN Salley Slaughter [id number] AT 1414 BY CM Performed at [facility], [place] [address] [place], [place] [address]   Expectorated sputum assessment w rflx to resp cult     Status: None   Collection Time: 05/06/19  1:00 PM   Specimen: Expectorated Sputum  Result Value Ref Range Status   Specimen Description Expect. Sput  Final   Special Requests Normal  Final   Sputum evaluation   Final    THIS  SPECIMEN IS ACCEPTABLE FOR SPUTUM CULTURE Performed at [facility], [place] [address], [place], [place] [address]    Report Status 05/06/2019 FINAL  Final  Culture, respiratory     Status: None   Collection Time: 05/06/19  1:00 PM  Result Value Ref Range Status   Specimen Description Expect. Sput  Final   Special Requests Normal Reflexed from [id number]  Final   Gram Stain   Final    RARE WBC PRESENT, PREDOMINANTLY PMN FEW GRAM POSITIVE  COCCI IN PAIRS FEW GRAM NEGATIVE RODS FEW GRAM POSITIVE RODS    Culture   Final    ABUNDANT Consistent with normal respiratory flora. Performed at [facility], [place] [address] [place], [place] [address]    Report Status 05/08/2019 FINAL  Final  Culture, blood (routine x 2)     Status: None   Collection Time: 05/07/19  9:02 PM   Specimen: BLOOD RIGHT ARM  Result Value Ref Range Status   Specimen Description BLOOD RIGHT ARM  Final   Special Requests   Final    BOTTLES DRAWN AEROBIC ONLY Blood Culture results may not be optimal due to an inadequate volume of blood received in culture bottles   Culture   Final    NO GROWTH 5 DAYS Performed at [facility], [place] [address], [place], [place] [address]    Report Status 05/12/2019 FINAL  Final  Culture, blood (routine x 2)     Status: None   Collection Time: 05/07/19  9:02 PM   Specimen: BLOOD RIGHT ARM  Result Value Ref Range Status   Specimen Description BLOOD RIGHT ARM  Final   Special Requests   Final    BOTTLES DRAWN AEROBIC ONLY Blood Culture adequate volume   Culture   Final    NO GROWTH 5 DAYS Performed at [facility] [place], [place] [address], [place], [place] [address]    Report Status 05/12/2019 FINAL  Final    Radiology Reports CT CHEST WO CONTRAST  Result Date: 05/06/2019 CLINICAL DATA:  Chest pain, shortness of breath, pneumomediastinum, acute hypoxic respiratory failure due to COVID-19 viral pneumonia EXAM: CT CHEST WITHOUT CONTRAST TECHNIQUE: Multidetector CT imaging of  the chest was performed following the standard protocol without IV contrast. Sagittal and coronal MPR images reconstructed from axial data set. COMPARISON:  None FINDINGS: Cardiovascular: Aorta normal caliber. Heart size normal. No pericardial effusion. Mediastinum/Nodes: Small hiatal hernia. Scattered pneumomediastinum few normal size mediastinal lymph nodes without thoracic adenopathy. Additional soft tissue gas is seen extending into the axilla and cervical regions bilaterally. Lungs/Pleura: Patchy airspace infiltrates throughout both lungs consistent with multifocal pneumonia and history of COVID-19. Multiple small cavitary foci are identified within the infiltrates in both lungs, greater in lower lobes. No pleural effusion or pneumothorax. No discrete pulmonary mass/nodule. Upper Abdomen: Visualized upper abdomen unremarkable Musculoskeletal: Osseous structures unremarkable. IMPRESSION: Extensive BILATERAL patchy airspace infiltrates consistent with multifocal pneumonia. Multiple cavitary foci are identified in both lungs, greater in the lower lobes; this is uncommon with COVID-19 alone, suggesting a superimposed cavitary pneumonia or sequela of aspiration pneumonia. Pneumomediastinum with gas extending into the cervical regions and less into the axilla as above; this is of uncertain etiology, could be related to barotrauma, coughing, mechanical ventilation. Electronically Signed   By: Lavonia Dana M.D.   On: 05/06/2019 10:11   DG Chest Port 1 View  Result Date: 05/05/2019 CLINICAL DATA:  Increasing shortness of breath. EXAM: PORTABLE CHEST 1 VIEW COMPARISON:  Chest radiograph 05/03/2019 FINDINGS: There is pneumomediastinum and subcutaneous emphysema throughout the soft tissues of the visualized neck base. There are worsening bilateral diffuse airspace opacities. There is more focal consolidation in the right peripheral lung. No significant pleural effusion. Query tiny right apical pneumothorax. IMPRESSION:  1. Pneumomediastinum and subcutaneous emphysema throughout the soft tissues of the visualized neck base. 2. Interval worsening of bilateral diffuse airspace opacities. 3. Query tiny right apical pneumothorax. Electronically Signed   By: Audie Pinto M.D.   On:  05/05/2019 16:31   DG Chest Port 1 View  Result Date: 05/03/2019 CLINICAL DATA:  Shortness of breath, COVID EXAM: PORTABLE CHEST 1 VIEW COMPARISON:  04/28/2019 FINDINGS: Cardiomegaly. There is interval increase in heterogeneous bilateral, bibasilar predominant airspace opacity. IMPRESSION: Interval increase in heterogeneous bilateral, bibasilar predominant airspace opacity, consistent with worsened multifocal infection and in keeping with COVID airspace disease. Electronically Signed   By: Eddie Candle M.D.   On: 05/03/2019 10:51   DG Chest Port 1 View  Result Date: 04/28/2019 CLINICAL DATA:  COVID-19 positive, short of breath, hypoxia EXAM: PORTABLE CHEST 1 VIEW COMPARISON:  None. FINDINGS: Single frontal view of the chest demonstrates an unremarkable cardiac silhouette. Diffuse interstitial prominence with patchy bilateral ground-glass opacities greatest at the lung bases. No effusion or pneumothorax. No acute bony abnormalities. IMPRESSION: 1. Multifocal pneumonia compatible with COVID-19.  The Electronically Signed   By: Randa Ngo M.D.   On: 04/28/2019 16:47   VAS [place] LOWER EXTREMITY VENOUS (DVT)  Result Date: 05/03/2019  Lower Venous DVTStudy Indications: Edema, and Swelling.  Comparison Study: no prior Performing Technologist: Abram Sander RVS  Examination Guidelines: A complete evaluation includes B-mode imaging, spectral Doppler, color Doppler, and power Doppler as needed of all accessible portions of each vessel. Bilateral testing is considered an integral part of a complete examination. Limited examinations for reoccurring indications may be performed as noted. The reflux portion of the exam is performed with the patient in  reverse Trendelenburg.  +---------+---------------+---------+-----------+----------+--------------+ RIGHT    CompressibilityPhasicitySpontaneityPropertiesThrombus Aging +---------+---------------+---------+-----------+----------+--------------+ CFV      Full           Yes      Yes                                 +---------+---------------+---------+-----------+----------+--------------+ SFJ      Full                                                        +---------+---------------+---------+-----------+----------+--------------+ FV Prox  Full                                                        +---------+---------------+---------+-----------+----------+--------------+ FV Mid   Full                                                        +---------+---------------+---------+-----------+----------+--------------+ FV DistalFull                                                        +---------+---------------+---------+-----------+----------+--------------+ PFV      Full                                                        +---------+---------------+---------+-----------+----------+--------------+  POP      Full           Yes      Yes                                 +---------+---------------+---------+-----------+----------+--------------+ PTV      Full                                                        +---------+---------------+---------+-----------+----------+--------------+ PERO                                                  Not visualized +---------+---------------+---------+-----------+----------+--------------+   +---------+---------------+---------+-----------+----------+--------------+ LEFT     CompressibilityPhasicitySpontaneityPropertiesThrombus Aging +---------+---------------+---------+-----------+----------+--------------+ CFV      Full           Yes      Yes                                  +---------+---------------+---------+-----------+----------+--------------+ SFJ      Full                                                        +---------+---------------+---------+-----------+----------+--------------+ FV Prox  Full                                                        +---------+---------------+---------+-----------+----------+--------------+ FV Mid   Full                                                        +---------+---------------+---------+-----------+----------+--------------+ FV DistalFull                                                        +---------+---------------+---------+-----------+----------+--------------+ PFV      Full                                                        +---------+---------------+---------+-----------+----------+--------------+ POP      Full           Yes      Yes                                 +---------+---------------+---------+-----------+----------+--------------+  PTV      Full                                                        +---------+---------------+---------+-----------+----------+--------------+ PERO                                                  Not visualized +---------+---------------+---------+-----------+----------+--------------+     Summary: BILATERAL: - No evidence of deep vein thrombosis seen in the lower extremities, bilaterally.   *See table(s) above for measurements and observations. Electronically signed by Curt Jews MD on 05/03/2019 at 4:26:06 PM.    Final    ECHOCARDIOGRAM LIMITED  Result Date: 05/07/2019    ECHOCARDIOGRAM LIMITED REPORT   Patient Name:   Erica [name] Date of Exam: 05/07/2019 Medical Rec #:  [id number]    Height:       65.0 in Accession #:    [id number]   Weight:       238.1 lb Date of Birth:  1970/11/07    BSA:          2.130 m Patient Age:    43 years     BP:           112/61 mmHg Patient Gender: F            HR:           84 bpm. Exam Location:   Inpatient Procedure: Limited Echo, Cardiac Doppler and Color Doppler Indications:    CHF  History:        Patient has no prior history of Echocardiogram examinations.                 Signs/Symptoms:Shortness of Breath. COVID POSITIVE, pneumonia,                 Obesity.  Sonographer:    Dustin Flock Referring Phys: [place]  1. Left ventricular ejection fraction, by estimation, is 65 to 70%. The left ventricle has normal function. The left ventricle has no regional wall motion abnormalities. Left ventricular diastolic parameters are consistent with Grade I diastolic dysfunction (impaired relaxation).  2. Right ventricular systolic function is normal. The right ventricular size is normal.  3. The mitral valve is normal in structure. No evidence of mitral valve regurgitation. No evidence of mitral stenosis.  4. The aortic valve is normal in structure. Aortic valve regurgitation is not visualized. No aortic stenosis is present.  5. The inferior vena cava is normal in size with greater than 50% respiratory variability, suggesting right atrial pressure of 3 mmHg. FINDINGS  Left Ventricle: Left ventricular ejection fraction, by estimation, is 65 to 70%. The left ventricle has normal function. The left ventricle has no regional wall motion abnormalities. The left ventricular internal cavity size was normal in size. There is  no left ventricular hypertrophy. Right Ventricle: The right ventricular size is normal. No increase in right ventricular wall thickness. Right ventricular systolic function is normal. Left Atrium: Left atrial size was normal in size. Right Atrium: Right atrial size was normal in size. Pericardium: There is no evidence of pericardial effusion. Mitral Valve: The mitral valve is normal in structure. Normal  mobility of the mitral valve leaflets. No evidence of mitral valve stenosis. Tricuspid Valve: The tricuspid valve is normal in structure. Tricuspid valve regurgitation is  not demonstrated. No evidence of tricuspid stenosis. Aortic Valve: The aortic valve is normal in structure. Aortic valve regurgitation is not visualized. No aortic stenosis is present. Pulmonic Valve: The pulmonic valve was normal in structure. Pulmonic valve regurgitation is not visualized. No evidence of pulmonic stenosis. Aorta: The aortic root is normal in size and structure. Venous: The inferior vena cava is normal in size with greater than 50% respiratory variability, suggesting right atrial pressure of 3 mmHg. IAS/Shunts: No atrial level shunt detected by color flow Doppler.  LEFT VENTRICLE PLAX 2D LVIDd:         4.00 cm  Diastology LVIDs:         2.55 cm  LV e' lateral:   7.40 cm/s LV PW:         0.97 cm  LV E/e' lateral: 7.2 LV IVS:        1.03 cm  LV e' medial:    5.33 cm/s LVOT diam:     1.70 cm  LV E/e' medial:  10.0 LVOT Area:     2.27 cm  RIGHT VENTRICLE RV S prime:     5.11 cm/s LEFT ATRIUM         Index LA diam:    2.80 cm 1.31 cm/m   AORTA Ao Root diam: 2.10 cm MITRAL VALVE MV Area (PHT): 3.48 cm    SHUNTS MV Decel Time: 218 msec    Systemic Diam: 1.70 cm MV E velocity: 53.10 cm/s MV A velocity: 63.00 cm/s MV E/A ratio:  0.84 Candee Furbish MD Electronically signed by Candee Furbish MD Signature Date/Time: 05/07/2019/3:18:45 PM    Final

## 2019-05-12 NOTE — Progress Notes (Signed)
Pt slept for most of the night.  Continues to be on room air and saturating well. She still has a very productive cough.  No new complaints.  No acute events overnight.

## 2019-05-13 LAB — CBC
HCT: 41.7 % (ref 36.0–46.0)
Hemoglobin: 13.7 g/dL (ref 12.0–15.0)
MCH: 30.2 pg (ref 26.0–34.0)
MCHC: 32.9 g/dL (ref 30.0–36.0)
MCV: 92.1 fL (ref 80.0–100.0)
Platelets: 143 10*3/uL — ABNORMAL LOW (ref 150–400)
RBC: 4.53 MIL/uL (ref 3.87–5.11)
RDW: 15 % (ref 11.5–15.5)
WBC: 6 10*3/uL (ref 4.0–10.5)
nRBC: 0 % (ref 0.0–0.2)

## 2019-05-13 LAB — BASIC METABOLIC PANEL
Anion gap: 9 (ref 5–15)
BUN: 6 mg/dL (ref 6–20)
CO2: 23 mmol/L (ref 22–32)
Calcium: 8.9 mg/dL (ref 8.9–10.3)
Chloride: 110 mmol/L (ref 98–111)
Creatinine, Ser: 0.89 mg/dL (ref 0.44–1.00)
GFR calc Af Amer: >60 mL/min (ref >60)
GFR calc non Af Amer: >60 mL/min (ref >60)
Glucose, Bld: 86 mg/dL (ref 70–99)
Potassium: 4 mmol/L (ref 3.5–5.1)
Sodium: 142 mmol/L (ref 135–145)

## 2019-05-13 LAB — HEMOGLOBIN A1C
Hgb A1c MFr Bld: 6.8 % — ABNORMAL HIGH (ref 4.8–5.6)
Mean Plasma Glucose: 148.46 mg/dL

## 2019-05-13 LAB — GLUCOSE, CAPILLARY
Glucose-Capillary: 86 mg/dL (ref 70–99)
Glucose-Capillary: 73 mg/dL (ref 70–99)
Glucose-Capillary: 139 mg/dL — ABNORMAL HIGH (ref 70–99)
Glucose-Capillary: 90 mg/dL (ref 70–99)

## 2019-05-13 LAB — D-DIMER, QUANTITATIVE: D-Dimer, Quant: 2.34 ug/mL-FEU — ABNORMAL HIGH (ref 0.00–0.50)

## 2019-05-13 MED ORDER — INSULIN GLARGINE 100 UNIT/ML ~~LOC~~ SOLN
10.0000 [IU] | Freq: Every day | SUBCUTANEOUS | Status: DC
  Administered 2019-05-13: 10 [IU] via SUBCUTANEOUS
  Filled 2019-05-13: qty 0.1

## 2019-05-13 NOTE — Progress Notes (Signed)
Pt slept for most of the night. No new complaints.  No acute events overnight.

## 2019-05-13 NOTE — Progress Notes (Signed)
PROGRESS NOTE                                                                                                                                                                                                             Patient Demographics:    Erica [name], is a 48 y.o. female, DOB - 1970-03-23, OZH:[id number]  Outpatient Primary MD for the patient is Alroy Dust, L.Marlou Sa, MD    LOS - 15  Admit date - 04/28/2019    Chief Complaint  Patient presents with  . COVID  . Shortness of Breath       Brief Narrative    Erica [name] is a 49 y.o. female with no significant past medical history presents the emergency department today with increasing short of breath.  Her symptoms started on 04/20/2019, initially was only feeling tired but no short of breath.  She had recently tested positive on 04/21/2019 for COVID-19 pneumonia and was placed on oral steroid treatment without much benefit, came to the ER with shortness of breath and was diagnosed with acute hypoxic respiratory failure due to COVID-19 pneumonia and admitted to the hospital with 6 L nasal cannula oxygen requirement.  She was treated promptly with IV steroids, remdesivir and Actemra, unfortunately her course was complicated by development of pneumomediastinum and small pneumothorax which has contained, she has developed aspiration bacterial pneumonia for which ID is following the patient.  She has 1 out of 4 blood cultures positive for MSSA bacteremia.   Subjective:   Patient reports dyspnea has improved at rest, but still significant with activity, but overall feels she is improving, still having cough, but much less productive.   Assessment  & Plan :    Acute Hypoxic Resp. Failure due to Acute Covid 19 Viral Pneumonitis during the ongoing 2020 Covid 19 Pandemic  - she has severe disease and was treated promptly after appropriate consents with IV Actemra on 04/29/2019 along with  steroids and Remdesivir, she has shown slow but definite improvement, currently room air at rest, down from 15L/ minute. -Unfortunately she has developed a small pneumothorax and pneumomediastinum which will be monitored, also signs of possible aspiration pneumonia .  Encouraged the patient to sit up in chair in the daytime use I-S and flutter valve for pulmonary toiletry and then prone in bed when at night.  Will advance  activity and titrate down oxygen as possible.  SpO2: 97 % O2 Flow Rate (L/min): 2 L/min   Recent Labs  Lab 05/07/19 0517 05/07/19 0517 05/08/19 0530 05/09/19 0740 05/10/19 0306 05/12/19 0815 05/13/19 0710  CRP 0.5  --  0.6 0.6 0.6  --   --   DDIMER 3.75*   < > 5.30* 7.56* 7.85* 3.26* 2.34*  PROCALCITON 0.50  --  0.23  --   --   --   --    < > = values in this interval not displayed.    Hepatic Function Latest Ref Rng & Units 05/10/2019 05/09/2019 05/08/2019  Total Protein 6.5 - 8.1 g/dL 5.5(L) 5.3(L) 5.0(L)  Albumin 3.5 - 5.0 g/dL 2.6(L) 2.6(L) 2.5(L)  AST 15 - 41 U/L 43(H) 43(H) 32  ALT 0 - 44 U/L 51(H) 48(H) 45(H)  Alk Phosphatase 38 - 126 U/L 60 64 70  Total Bilirubin 0.3 - 1.2 mg/dL 0.7 0.6 0.9  Bilirubin, Direct 0.0 - 0.2 mg/dL - - -   MSSA bacteremia - 1 set of blood cultures was positive . - repeat blood cultures the following day remain negative.   - There is no evidence of endocarditis on TTE.   -Antibiotics per ID recommendation, patient was treated with IV cefazolin during hospital stay, she did receive 1 dose of long-acting antibiotic events in today .                             History of shingles.   - Currently stable does not take Valtrex anymore at home on a scheduled basis.  Borderline elevated D-dimer.   -She has elevated D-dimer despite being on moderately high dose of Lovenox, lower extremity ultrasound negative, patient does have exposure to hormonal contraceptive, her D-dimers peaked at 7.8, she was placed on intermediate Lovenox dose 0.5  mg/kg every 12 hours, she will need to be placed on DVT prophylaxis Eliquis dose on discharge .  Obesity.  BMI 37.  Follow with PCP.  Superimposed aspiration bacterial pneumonia.  -  Patient strictly counseled multiple times to have all meals in the chair, she tends to eat in bed laying flat, cleared by speech, 1/4 bottles growing MSSA, was initially on Unasyn but now switched to cefazolin IV, stable echocardiogram, procalcitonin is stable, CT was discussed with pulmonary, ID following.  Note patient also received Actemra for her severe COVID-19 infection which does predispose her to bacterial infections.  ID are following, currently he is on IV cefazolin, and to receive 1 dose of IV oritavancin today.   Diabetes mellitus -She was on insulin sliding scale and Lantus during hospital stay, but overall her CBGs are low, A1c 6.2 on admission, I will go ahead and DC Lantus, likely will discharge on low-dose Metformin 500 mg once daily.   Condition - Extremely Guarded  Family Communication  : D/W patient brother 5/8.  Code Status : Full  Diet :   Diet Order            Diet regular Room service appropriate? Yes with Assist; Fluid consistency: Thin  Diet effective now               Disposition Plan  : Discharge home tomorrow with home health.  Consults  : CT  discussed with pulmonary on 05/06/2019. ID  Procedures  :    TTE -   1. Left ventricular ejection fraction, by estimation, is 65 to 70%. The left  ventricle has normal function. The left ventricle has no regional wall motion abnormalities. Left ventricular diastolic parameters are consistent with Grade I diastolic dysfunction (impaired relaxation).  2. Right ventricular systolic function is normal. The right ventricular size is normal.  3. The mitral valve is normal in structure. No evidence of mitral valve regurgitation. No evidence of mitral stenosis.  4. The aortic valve is normal in structure. Aortic valve regurgitation is  not visualized. No aortic stenosis is present.  5. The inferior vena cava is normal in size with greater than 50% respiratory variability, suggesting right atrial pressure of 3 mmHg.  Leg [place] - No DVT  CT - Extensive BILATERAL patchy airspace infiltrates consistent with multifocal pneumonia. Multiple cavitary foci are identified in both lungs, greater in the lower lobes; this is uncommon with COVID-19 alone, suggesting a superimposed cavitary pneumonia or sequela of aspiration pneumonia. Pneumomediastinum with gas extending into the cervical regions and less into the axilla as above; this is of uncertain etiology, could be related to barotrauma, coughing, mechanical ventilation.   PUD Prophylaxis : None  DVT Prophylaxis  :  Lovenox    Lab Results  Component Value Date   PLT 143 (L) 05/13/2019    Inpatient Medications  Scheduled Meds: . albuterol  2 puff Inhalation Q6H  . enoxaparin (LOVENOX) injection  60 mg Subcutaneous Q12H  . insulin aspart  0-15 Units Subcutaneous TID WC  . insulin aspart  0-5 Units Subcutaneous QHS  . insulin glargine  10 Units Subcutaneous Daily  . mouth rinse  15 mL Mouth Rinse BID  . multivitamin with minerals  1 tablet Oral Daily   Continuous Infusions: . oritavancin (ORBACTIV) IVPB     PRN Meds:.acetaminophen, guaiFENesin-dextromethorphan, menthol-cetylpyridinium, valACYclovir  Antibiotics  :    Anti-infectives (From admission, onward)   Start     Dose/Rate Route Frequency Ordered Stop   05/13/19 1200  Oritavancin Diphosphate (ORBACTIV) 1,200 mg in dextrose 5 % IVPB     1,200 mg 333.3 mL/hr over 180 Minutes Intravenous Once 05/09/19 1126     05/07/19 1400  ceFAZolin (ANCEF) IVPB 2g/100 mL premix     2 g 200 mL/hr over 30 Minutes Intravenous Every 8 hours 05/07/19 1244 05/13/19 0544   05/06/19 1200  Ampicillin-Sulbactam (UNASYN) 3 g in sodium chloride 0.9 % 100 mL IVPB  Status:  Discontinued     3 g 200 mL/hr over 30 Minutes Intravenous Every 8  hours 05/06/19 1029 05/07/19 1445   04/30/19 1000  remdesivir 100 mg in sodium chloride 0.9 % 100 mL IVPB     100 mg 200 mL/hr over 30 Minutes Intravenous Daily 04/29/19 0018 05/03/19 0832   04/29/19 1034  valACYclovir (VALTREX) tablet 500 mg    Note to Pharmacy: Patient taking differently: Take one tablet (500 mg) by mouth twice daily for one week - as needed for breakouts     500 mg Oral 2 times daily between meals and at bedtime PRN 04/29/19 0950     04/29/19 1000  remdesivir 100 mg in sodium chloride 0.9 % 100 mL IVPB  Status:  Discontinued     100 mg 200 mL/hr over 30 Minutes Intravenous Daily 04/28/19 1736 04/29/19 0018   04/29/19 0100  remdesivir 100 mg in sodium chloride 0.9 % 100 mL IVPB     100 mg 200 mL/hr over 30 Minutes Intravenous Every 30 min 04/29/19 0018 04/29/19 0700   04/28/19 1745  remdesivir 200 mg in sodium chloride 0.9% 250 mL IVPB  Status:  Discontinued     200 mg 580 mL/hr over 30 Minutes Intravenous Once 04/28/19 1736 04/29/19 0018       Erica [name] M.D on 05/13/2019 at 2:08 PM  To page go to [email or web address] - password [email or web address]  [facility] -  Office  [phone]    See all Orders from today for further details    Objective:   Vitals:   05/12/19 0418 05/12/19 1217 05/12/19 2001 05/13/19 0420  BP: 104/79 (!) 129/93 (!) 168/66 120/75  Pulse: 76 66 82 80  Resp: 16 18 16 18   Temp: 98.2 F (36.8 C) 98.4 F (36.9 C) 98.2 F (36.8 C) 98.2 F (36.8 C)  TempSrc: Oral Oral Oral Oral  SpO2: 97% 100% 100% 97%  Weight:      Height:        Wt Readings from Last 3 Encounters:  05/02/19 108 kg  01/24/19 106.1 kg  04/29/11 96.6 kg     Intake/Output Summary (Last 24 hours) at 05/13/2019 1408 Last data filed at 05/13/2019 0600 Gross per 24 hour  Intake 758 ml  Output --  Net 758 ml     Physical Exam  Awake Alert, Oriented X 3, No new F.N deficits, Normal affect Symmetrical Chest wall movement, Good air movement bilaterally, CTAB RRR,No  Gallops,Rubs or new Murmurs, No Parasternal Heave +ve B.Sounds, Abd Soft, No tenderness, No rebound - guarding or rigidity. No Cyanosis, Clubbing or edema, No new Rash or bruise       Data Review:    CBC Recent Labs  Lab 05/07/19 0517 05/07/19 0517 05/08/19 0530 05/09/19 0740 05/10/19 0306 05/12/19 0815 05/13/19 0710  WBC 25.7*   < > 16.3* 9.5 5.8 6.3 6.0  HGB 13.8   < > 13.3 13.5 13.4 14.3 13.7  HCT 42.3   < > 39.8 40.7 40.5 43.8 41.7  PLT 264   < > 234 200 176 140* 143*  MCV 92.2   < > 91.5 92.3 91.6 92.4 92.1  MCH 30.1   < > 30.6 30.6 30.3 30.2 30.2  MCHC 32.6   < > 33.4 33.2 33.1 32.6 32.9  RDW 14.2   < > 14.2 14.1 14.3 14.9 15.0  LYMPHSABS 2.8  --  2.8 1.6 1.8  --   --   MONOABS 1.0  --  0.8 0.7 0.6  --   --   EOSABS 0.3  --  0.1 0.1 0.1  --   --   BASOSABS 0.0  --  0.1 0.1 0.1  --   --    < > = values in this interval not displayed.    Chemistries  Recent Labs  Lab 05/07/19 0517 05/07/19 0517 05/08/19 0530 05/09/19 0740 05/10/19 0306 05/12/19 0815 05/13/19 0710  NA 141   < > 141 141 141 139 142  K 3.8   < > 3.6 3.5 3.8 3.4* 4.0  CL 106   < > 106 109 108 107 110  CO2 25   < > 24 23 23  19* 23  GLUCOSE 109*   < > 113* 112* 102* 114* 86  BUN 9   < > 9 5* 6 8 6   CREATININE 1.04*   < > 0.96 0.99 0.91 0.92 0.89  CALCIUM 7.8*   < > 8.1* 8.0* 8.3* 8.5* 8.9  AST 38  --  32 43* 43*  --   --   ALT 51*  --  45* 48* 51*  --   --  ALKPHOS 82  --  70 64 60  --   --   BILITOT 1.0  --  0.9 0.6 0.7  --   --   MG 2.2  --  2.2 2.1 2.2  --   --   HGBA1C  --   --   --   --   --   --  6.8*   < > = values in this interval not displayed.     ------------------------------------------------------------------------------------------------------------------ No results for input(s): CHOL, HDL, LDLCALC, TRIG, CHOLHDL, LDLDIRECT in the last 72 hours.  Lab Results  Component Value Date   HGBA1C 6.8 (H) 05/13/2019    ------------------------------------------------------------------------------------------------------------------ No results for input(s): TSH, T4TOTAL, T3FREE, THYROIDAB in the last 72 hours.  Invalid input(s): FREET3  Cardiac Enzymes No results for input(s): CKMB, TROPONINI, MYOGLOBIN in the last 168 hours.  Invalid input(s): CK ------------------------------------------------------------------------------------------------------------------    Component Value Date/Time   BNP 45.8 05/03/2019 0344    Micro Results Recent Results (from the past 240 hour(s))  Culture, blood (routine x 2)     Status: None   Collection Time: 05/06/19 10:44 AM   Specimen: BLOOD  Result Value Ref Range Status   Specimen Description BLOOD SITE NOT SPECIFIED  Final   Special Requests   Final    BOTTLES DRAWN AEROBIC ONLY Blood Culture results may not be optimal due to an inadequate volume of blood received in culture bottles   Culture   Final    NO GROWTH 5 DAYS Performed at [facility], [place] [address] [place], [place] [address]    Report Status 05/11/2019 FINAL  Final  Culture, blood (routine x 2)     Status: Abnormal   Collection Time: 05/06/19 10:47 AM   Specimen: BLOOD RIGHT ARM  Result Value Ref Range Status   Specimen Description BLOOD RIGHT ARM  Final   Special Requests   Final    BOTTLES DRAWN AEROBIC AND ANAEROBIC Blood Culture adequate volume   Culture  Setup Time   Final    GRAM POSITIVE COCCI IN CLUSTERS ANAEROBIC BOTTLE ONLY CRITICAL RESULT CALLED TO, READ BACK BY AND VERIFIED WITH: TJaneice Robinson PHARMD, AT 1059 05/07/19 BY D. VANHOOK Performed at [facility], [place] [address], [place], [place] [address]    Culture STAPHYLOCOCCUS AUREUS (A)  Final   Report Status 05/09/2019 FINAL  Final   Organism ID, Bacteria STAPHYLOCOCCUS AUREUS  Final      Susceptibility   Staphylococcus aureus - MIC*    CIPROFLOXACIN <=0.5 SENSITIVE Sensitive     ERYTHROMYCIN <=0.25 SENSITIVE  Sensitive     GENTAMICIN <=0.5 SENSITIVE Sensitive     OXACILLIN <=0.25 SENSITIVE Sensitive     TETRACYCLINE <=1 SENSITIVE Sensitive     VANCOMYCIN 1 SENSITIVE Sensitive     TRIMETH/SULFA <=10 SENSITIVE Sensitive     CLINDAMYCIN <=0.25 SENSITIVE Sensitive     RIFAMPIN <=0.5 SENSITIVE Sensitive     Inducible Clindamycin NEGATIVE Sensitive     * STAPHYLOCOCCUS AUREUS  Blood Culture ID Panel (Reflexed)     Status: Abnormal   Collection Time: 05/06/19 10:47 AM  Result Value Ref Range Status   Enterococcus species NOT DETECTED NOT DETECTED Final   Listeria monocytogenes NOT DETECTED NOT DETECTED Final   Staphylococcus species DETECTED (A) NOT DETECTED Final    Comment: CRITICAL RESULT CALLED TO, READ BACK BY AND VERIFIED WITH: T. BAUMEISTER PHARMD, AT 1059 05/07/19 BY D. VANHOOK    Staphylococcus aureus (BCID) DETECTED (A) NOT  DETECTED Final    Comment: Methicillin (oxacillin) susceptible Staphylococcus aureus (MSSA). Preferred therapy is anti staphylococcal beta lactam antibiotic (Cefazolin or Nafcillin), unless clinically contraindicated. CRITICAL RESULT CALLED TO, READ BACK BY AND VERIFIED WITH: T. BAUMEISTER PHARMD, AT 1059 05/07/19 BY D. VANHOOK    Methicillin resistance NOT DETECTED NOT DETECTED Final   Streptococcus species NOT DETECTED NOT DETECTED Final   Streptococcus agalactiae NOT DETECTED NOT DETECTED Final   Streptococcus pneumoniae NOT DETECTED NOT DETECTED Final   Streptococcus pyogenes NOT DETECTED NOT DETECTED Final   Acinetobacter baumannii NOT DETECTED NOT DETECTED Final   Enterobacteriaceae species NOT DETECTED NOT DETECTED Final   Enterobacter cloacae complex NOT DETECTED NOT DETECTED Final   Escherichia coli NOT DETECTED NOT DETECTED Final   Klebsiella oxytoca NOT DETECTED NOT DETECTED Final   Klebsiella pneumoniae NOT DETECTED NOT DETECTED Final   Proteus species NOT DETECTED NOT DETECTED Final   Serratia marcescens NOT DETECTED NOT DETECTED Final   Haemophilus  influenzae NOT DETECTED NOT DETECTED Final   Neisseria meningitidis NOT DETECTED NOT DETECTED Final   Pseudomonas aeruginosa NOT DETECTED NOT DETECTED Final   Candida albicans NOT DETECTED NOT DETECTED Final   Candida glabrata NOT DETECTED NOT DETECTED Final   Candida krusei NOT DETECTED NOT DETECTED Final   Candida parapsilosis NOT DETECTED NOT DETECTED Final   Candida tropicalis NOT DETECTED NOT DETECTED Final    Comment: Performed at [facility], [place]. [address], [place], [place] [address]  MRSA PCR Screening     Status: Abnormal   Collection Time: 05/06/19 10:59 AM   Specimen: Nasal Mucosa; Nasopharyngeal  Result Value Ref Range Status   MRSA by PCR POSITIVE (A) NEGATIVE Final    Comment:        The GeneXpert MRSA Assay (FDA approved for NASAL specimens only), is one component of a comprehensive MRSA colonization surveillance program. It is not intended to diagnose MRSA infection nor to guide or monitor treatment for MRSA infections. CRITICAL RESULT CALLED TO, READ BACK BY AND VERIFIED WITH: RN Salley Slaughter [id number] AT 1414 BY CM Performed at [facility], [place] [address], [place], [place] [address]   Expectorated sputum assessment w rflx to resp cult     Status: None   Collection Time: 05/06/19  1:00 PM   Specimen: Expectorated Sputum  Result Value Ref Range Status   Specimen Description Expect. Sput  Final   Special Requests Normal  Final   Sputum evaluation   Final    THIS SPECIMEN IS ACCEPTABLE FOR SPUTUM CULTURE Performed at [facility], [place] [address] [place], [place] [address]    Report Status 05/06/2019 FINAL  Final  Culture, respiratory     Status: None   Collection Time: 05/06/19  1:00 PM  Result Value Ref Range Status   Specimen Description Expect. Sput  Final   Special Requests Normal Reflexed from [id number]  Final   Gram Stain   Final    RARE WBC PRESENT, PREDOMINANTLY PMN FEW GRAM POSITIVE COCCI IN PAIRS FEW GRAM NEGATIVE RODS FEW GRAM  POSITIVE RODS    Culture   Final    ABUNDANT Consistent with normal respiratory flora. Performed at [facility], [place] [address] [place], [place] [address]    Report Status 05/08/2019 FINAL  Final  Culture, blood (routine x 2)     Status: None   Collection Time: 05/07/19  9:02 PM   Specimen: BLOOD RIGHT ARM  Result Value Ref Range  Status   Specimen Description BLOOD RIGHT ARM  Final   Special Requests   Final    BOTTLES DRAWN AEROBIC ONLY Blood Culture results may not be optimal due to an inadequate volume of blood received in culture bottles   Culture   Final    NO GROWTH 5 DAYS Performed at [facility], [place] [address], [place], [place] [address]    Report Status 05/12/2019 FINAL  Final  Culture, blood (routine x 2)     Status: None   Collection Time: 05/07/19  9:02 PM   Specimen: BLOOD RIGHT ARM  Result Value Ref Range Status   Specimen Description BLOOD RIGHT ARM  Final   Special Requests   Final    BOTTLES DRAWN AEROBIC ONLY Blood Culture adequate volume   Culture   Final    NO GROWTH 5 DAYS Performed at [facility], [place] [address], [place], [place] [address]    Report Status 05/12/2019 FINAL  Final    Radiology Reports CT CHEST WO CONTRAST  Result Date: 05/06/2019 CLINICAL DATA:  Chest pain, shortness of breath, pneumomediastinum, acute hypoxic respiratory failure due to COVID-19 viral pneumonia EXAM: CT CHEST WITHOUT CONTRAST TECHNIQUE: Multidetector CT imaging of the chest was performed following the standard protocol without IV contrast. Sagittal and coronal MPR images reconstructed from axial data set. COMPARISON:  None FINDINGS: Cardiovascular: Aorta normal caliber. Heart size normal. No pericardial effusion. Mediastinum/Nodes: Small hiatal hernia. Scattered pneumomediastinum few normal size mediastinal lymph nodes without thoracic adenopathy. Additional soft tissue gas is seen extending into the axilla and cervical regions bilaterally. Lungs/Pleura:  Patchy airspace infiltrates throughout both lungs consistent with multifocal pneumonia and history of COVID-19. Multiple small cavitary foci are identified within the infiltrates in both lungs, greater in lower lobes. No pleural effusion or pneumothorax. No discrete pulmonary mass/nodule. Upper Abdomen: Visualized upper abdomen unremarkable Musculoskeletal: Osseous structures unremarkable. IMPRESSION: Extensive BILATERAL patchy airspace infiltrates consistent with multifocal pneumonia. Multiple cavitary foci are identified in both lungs, greater in the lower lobes; this is uncommon with COVID-19 alone, suggesting a superimposed cavitary pneumonia or sequela of aspiration pneumonia. Pneumomediastinum with gas extending into the cervical regions and less into the axilla as above; this is of uncertain etiology, could be related to barotrauma, coughing, mechanical ventilation. Electronically Signed   By: Lavonia Dana M.D.   On: 05/06/2019 10:11   DG Chest Port 1 View  Result Date: 05/05/2019 CLINICAL DATA:  Increasing shortness of breath. EXAM: PORTABLE CHEST 1 VIEW COMPARISON:  Chest radiograph 05/03/2019 FINDINGS: There is pneumomediastinum and subcutaneous emphysema throughout the soft tissues of the visualized neck base. There are worsening bilateral diffuse airspace opacities. There is more focal consolidation in the right peripheral lung. No significant pleural effusion. Query tiny right apical pneumothorax. IMPRESSION: 1. Pneumomediastinum and subcutaneous emphysema throughout the soft tissues of the visualized neck base. 2. Interval worsening of bilateral diffuse airspace opacities. 3. Query tiny right apical pneumothorax. Electronically Signed   By: Audie Pinto M.D.   On: 05/05/2019 16:31   DG Chest Port 1 View  Result Date: 05/03/2019 CLINICAL DATA:  Shortness of breath, COVID EXAM: PORTABLE CHEST 1 VIEW COMPARISON:  04/28/2019 FINDINGS: Cardiomegaly. There is interval increase in heterogeneous  bilateral, bibasilar predominant airspace opacity. IMPRESSION: Interval increase in heterogeneous bilateral, bibasilar predominant airspace opacity, consistent with worsened multifocal infection and in keeping with COVID airspace disease. Electronically Signed   By: Eddie Candle M.D.   On: 05/03/2019 10:51   DG  Chest Port 1 View  Result Date: 04/28/2019 CLINICAL DATA:  COVID-19 positive, short of breath, hypoxia EXAM: PORTABLE CHEST 1 VIEW COMPARISON:  None. FINDINGS: Single frontal view of the chest demonstrates an unremarkable cardiac silhouette. Diffuse interstitial prominence with patchy bilateral ground-glass opacities greatest at the lung bases. No effusion or pneumothorax. No acute bony abnormalities. IMPRESSION: 1. Multifocal pneumonia compatible with COVID-19.  The Electronically Signed   By: Randa Ngo M.D.   On: 04/28/2019 16:47   VAS [place] LOWER EXTREMITY VENOUS (DVT)  Result Date: 05/03/2019  Lower Venous DVTStudy Indications: Edema, and Swelling.  Comparison Study: no prior Performing Technologist: Abram Sander RVS  Examination Guidelines: A complete evaluation includes B-mode imaging, spectral Doppler, color Doppler, and power Doppler as needed of all accessible portions of each vessel. Bilateral testing is considered an integral part of a complete examination. Limited examinations for reoccurring indications may be performed as noted. The reflux portion of the exam is performed with the patient in reverse Trendelenburg.  +---------+---------------+---------+-----------+----------+--------------+ RIGHT    CompressibilityPhasicitySpontaneityPropertiesThrombus Aging +---------+---------------+---------+-----------+----------+--------------+ CFV      Full           Yes      Yes                                 +---------+---------------+---------+-----------+----------+--------------+ SFJ      Full                                                         +---------+---------------+---------+-----------+----------+--------------+ FV Prox  Full                                                        +---------+---------------+---------+-----------+----------+--------------+ FV Mid   Full                                                        +---------+---------------+---------+-----------+----------+--------------+ FV DistalFull                                                        +---------+---------------+---------+-----------+----------+--------------+ PFV      Full                                                        +---------+---------------+---------+-----------+----------+--------------+ POP      Full           Yes      Yes                                 +---------+---------------+---------+-----------+----------+--------------+  PTV      Full                                                        +---------+---------------+---------+-----------+----------+--------------+ PERO                                                  Not visualized +---------+---------------+---------+-----------+----------+--------------+   +---------+---------------+---------+-----------+----------+--------------+ LEFT     CompressibilityPhasicitySpontaneityPropertiesThrombus Aging +---------+---------------+---------+-----------+----------+--------------+ CFV      Full           Yes      Yes                                 +---------+---------------+---------+-----------+----------+--------------+ SFJ      Full                                                        +---------+---------------+---------+-----------+----------+--------------+ FV Prox  Full                                                        +---------+---------------+---------+-----------+----------+--------------+ FV Mid   Full                                                         +---------+---------------+---------+-----------+----------+--------------+ FV DistalFull                                                        +---------+---------------+---------+-----------+----------+--------------+ PFV      Full                                                        +---------+---------------+---------+-----------+----------+--------------+ POP      Full           Yes      Yes                                 +---------+---------------+---------+-----------+----------+--------------+ PTV      Full                                                        +---------+---------------+---------+-----------+----------+--------------+  PERO                                                  Not visualized +---------+---------------+---------+-----------+----------+--------------+     Summary: BILATERAL: - No evidence of deep vein thrombosis seen in the lower extremities, bilaterally.   *See table(s) above for measurements and observations. Electronically signed by Curt Jews MD on 05/03/2019 at 4:26:06 PM.    Final    ECHOCARDIOGRAM LIMITED  Result Date: 05/07/2019    ECHOCARDIOGRAM LIMITED REPORT   Patient Name:   Erica [name] Date of Exam: 05/07/2019 Medical Rec #:  [id number]    Height:       65.0 in Accession #:    [id number]   Weight:       238.1 lb Date of Birth:  1970/07/29    BSA:          2.130 m Patient Age:    29 years     BP:           112/61 mmHg Patient Gender: F            HR:           84 bpm. Exam Location:  Inpatient Procedure: Limited Echo, Cardiac Doppler and Color Doppler Indications:    CHF  History:        Patient has no prior history of Echocardiogram examinations.                 Signs/Symptoms:Shortness of Breath. COVID POSITIVE, pneumonia,                 Obesity.  Sonographer:    Dustin Flock Referring Phys: Bergholz Hardy  1. Left ventricular ejection fraction, by estimation, is 65 to 70%. The left ventricle has normal  function. The left ventricle has no regional wall motion abnormalities. Left ventricular diastolic parameters are consistent with Grade I diastolic dysfunction (impaired relaxation).  2. Right ventricular systolic function is normal. The right ventricular size is normal.  3. The mitral valve is normal in structure. No evidence of mitral valve regurgitation. No evidence of mitral stenosis.  4. The aortic valve is normal in structure. Aortic valve regurgitation is not visualized. No aortic stenosis is present.  5. The inferior vena cava is normal in size with greater than 50% respiratory variability, suggesting right atrial pressure of 3 mmHg. FINDINGS  Left Ventricle: Left ventricular ejection fraction, by estimation, is 65 to 70%. The left ventricle has normal function. The left ventricle has no regional wall motion abnormalities. The left ventricular internal cavity size was normal in size. There is  no left ventricular hypertrophy. Right Ventricle: The right ventricular size is normal. No increase in right ventricular wall thickness. Right ventricular systolic function is normal. Left Atrium: Left atrial size was normal in size. Right Atrium: Right atrial size was normal in size. Pericardium: There is no evidence of pericardial effusion. Mitral Valve: The mitral valve is normal in structure. Normal mobility of the mitral valve leaflets. No evidence of mitral valve stenosis. Tricuspid Valve: The tricuspid valve is normal in structure. Tricuspid valve regurgitation is not demonstrated. No evidence of tricuspid stenosis. Aortic Valve: The aortic valve is normal in structure. Aortic valve regurgitation is not visualized. No aortic stenosis is present. Pulmonic Valve: The pulmonic valve was normal in structure. Pulmonic valve  regurgitation is not visualized. No evidence of pulmonic stenosis. Aorta: The aortic root is normal in size and structure. Venous: The inferior vena cava is normal in size with greater than 50%  respiratory variability, suggesting right atrial pressure of 3 mmHg. IAS/Shunts: No atrial level shunt detected by color flow Doppler.  LEFT VENTRICLE PLAX 2D LVIDd:         4.00 cm  Diastology LVIDs:         2.55 cm  LV e' lateral:   7.40 cm/s LV PW:         0.97 cm  LV E/e' lateral: 7.2 LV IVS:        1.03 cm  LV e' medial:    5.33 cm/s LVOT diam:     1.70 cm  LV E/e' medial:  10.0 LVOT Area:     2.27 cm  RIGHT VENTRICLE RV S prime:     5.11 cm/s LEFT ATRIUM         Index LA diam:    2.80 cm 1.31 cm/m   AORTA Ao Root diam: 2.10 cm MITRAL VALVE MV Area (PHT): 3.48 cm    SHUNTS MV Decel Time: 218 msec    Systemic Diam: 1.70 cm MV E velocity: 53.10 cm/s MV A velocity: 63.00 cm/s MV E/A ratio:  0.84 Candee Furbish MD Electronically signed by Candee Furbish MD Signature Date/Time: 05/07/2019/3:18:45 PM    Final

## 2019-05-14 LAB — BASIC METABOLIC PANEL
Anion gap: 10 (ref 5–15)
BUN: 9 mg/dL (ref 6–20)
CO2: 21 mmol/L — ABNORMAL LOW (ref 22–32)
Calcium: 8.5 mg/dL — ABNORMAL LOW (ref 8.9–10.3)
Chloride: 107 mmol/L (ref 98–111)
Creatinine, Ser: 0.98 mg/dL (ref 0.44–1.00)
GFR calc Af Amer: >60 mL/min (ref >60)
GFR calc non Af Amer: >60 mL/min (ref >60)
Glucose, Bld: 98 mg/dL (ref 70–99)
Potassium: 3.9 mmol/L (ref 3.5–5.1)
Sodium: 138 mmol/L (ref 135–145)

## 2019-05-14 LAB — CBC
HCT: 40 % (ref 36.0–46.0)
Hemoglobin: 13.2 g/dL (ref 12.0–15.0)
MCH: 30.6 pg (ref 26.0–34.0)
MCHC: 33 g/dL (ref 30.0–36.0)
MCV: 92.8 fL (ref 80.0–100.0)
Platelets: 131 10*3/uL — ABNORMAL LOW (ref 150–400)
RBC: 4.31 MIL/uL (ref 3.87–5.11)
RDW: 15.4 % (ref 11.5–15.5)
WBC: 8.4 10*3/uL (ref 4.0–10.5)
nRBC: 0 % (ref 0.0–0.2)

## 2019-05-14 LAB — GLUCOSE, CAPILLARY
Glucose-Capillary: 88 mg/dL (ref 70–99)
Glucose-Capillary: 88 mg/dL (ref 70–99)

## 2019-05-14 MED ORDER — GUAIFENESIN-DM 100-10 MG/5ML PO SYRP: 10.0000 mL | ORAL_SOLUTION | ORAL | 0 refills | Status: AC | PRN

## 2019-05-14 MED ORDER — BENZONATATE 200 MG PO CAPS: 200.0000 mg | ORAL_CAPSULE | Freq: Three times a day (TID) | ORAL | 0 refills | Status: AC | PRN

## 2019-05-14 NOTE — Discharge Summary (Signed)
Erica [name], is a 49 y.o. female  DOB 1970-06-27  MRN [id number].  Admission date:  04/28/2019  Admitting Physician  Erica [name], [name]  Discharge Date:  05/14/2019   Primary [name]  Erica [name], Erica [name].Erica [name], [name]  Recommendations for primary care physician for things to follow:  -Please check CBC, CMP during next visit, as we will repeat 2 view chest x-ray in 10 days from discharge. -Continue counseling about diet compliance for prediabetes.  Recheck A1c, and see if it is appropriate to start on Metformin .    Admission Diagnosis  Acute respiratory failure with hypoxia (HCC) [J96.01] Multifocal pneumonia [J18.9] COVID-19 [U07.1]   Discharge Diagnosis  Acute respiratory failure with hypoxia ([place]) [J96.01] Multifocal pneumonia [J18.9] COVID-19 [U07.1]    Principal Problem:   MSSA bacteremia Active Problems:   COVID-19 virus infection      Past Medical History:  Diagnosis Date  . BV (bacterial vaginosis) 2001  . COVID-19   . H/O gonorrhea 1995  . History of elevated lipids 03/18/2008  . HSV-2 infection 1995    History reviewed. No pertinent surgical history.     History of present illness and  Hospital Course:     Kindly see H&P for history of present illness and admission details, please review complete Labs, Consult reports and Test reports for all details in brief  HPI  from the history and physical done on the day of admission  HPI: Erica [name] is a 49 y.o. female with no significant past medical history presents the emergency department today with increasing short of breath.  Her symptoms started on 04/20/2019, initially was only feeling tired but no short of breath.   Next day, 4/17, she was tested positive for COVID-19. Patient started to have intermittent cough, loss of taste muscle aching as well. She has had intermittent low-grade fevers. Her breathing and shortness of breath have  progressed over the past 2 days. She contacted her PCP, who ordered hydrocodone cough syrup, which she took without significant improvement worsening symptoms. She went to the infusion clinic today at [facility] and was found to have an oxygen saturation of 81% on room air. Patient was placed on 4 L nasal cannula with improvement to the low 90s. Shift feels she contracted a virus to her job. ED Course: Oxygenation stabilized at 4 L, but still has significant tachypnea, x-ray showed bilateral multifocal infiltrates  Hospital Course   Erica [name] a 49 y.o.femalewithno significant pastmedical historypresents the emergency department today withincreasing short of breath.Her symptoms startedon 04/20/2019,initially was only feeling tired but no short of breath.  She had recently tested positive on 04/21/2019 for COVID-19 pneumonia and was placed on oral steroid treatment without much benefit, came to the ER with shortness of breath and was diagnosed with acute hypoxic respiratory failure due to COVID-19 pneumonia and admitted to the hospital with 6 L nasal cannula oxygen requirement.  She was treated promptly with IV steroids, remdesivir and Actemra, unfortunately her course was complicated by development of pneumomediastinum  and small pneumothorax which has contained, she has developed aspiration bacterial pneumonia for which ID is following the patient.  She has 1 out of 4 blood cultures positive for MSSA bacteremia.  Acute Hypoxic Resp. Failure due to Acute Covid 19 Viral Pneumonitis during the ongoing 2020 Covid 19 Pandemic  - she has severe disease and was treated promptly after appropriate consents with IV Actemra on 04/29/2019 along with steroids and Remdesivir, she has shown slow but definite improvement, currently room air at rest, as well she was on room air with activity in the hallway today 92 to 95% on room air, down from peak oxygen requirement of 15L/ minute during hospital  stay. -Unfortunately she has developed a small pneumothorax and pneumomediastinum was monitored, with no significant progression . - Encouraged the patient to sit up in chair in the daytime use I-S and flutter valve for pulmonary toiletry and then prone in bed when at night.  Will advance activity and titrate down oxygen as possible.  MSSA bacteremia - 1 set of blood cultures was positive . - repeat blood cultures the following day remain negative.  - There is no evidence of endocarditis on TTE.  -Antibiotics per ID recommendation, patient was treated with IV cefazolin during hospital stay, she did receive 1 dose of long-acting antibiotic on 5/9 .   History of shingles.   - Currently stable does not take Valtrex anymore at home on a scheduled basis.  Borderline elevated D-dimer.   -She has elevated D-dimer despite being on moderately high dose of Lovenox, lower extremity ultrasound negative, patient does have exposure to hormonal contraceptive, her D-dimers peaked at 7.8, she was placed on intermediate Lovenox dose 0.5 mg/kg every 12 hours, she will need to be placed on DVT prophylaxis Eliquis dose on discharge  for total of 30 days.  Obesity.  BMI 37.  Follow with PCP.  Superimposed aspiration bacterial pneumonia.  -  Patient strictly counseled multiple times to have all meals in the chair, she tends to eat in bed laying flat, cleared by speech, 1/4 bottles growing MSSA, was initially on Unasyn but now switched to cefazolin IV, stable echocardiogram, procalcitonin is stable, CT was discussed with pulmonary, ID following.  Note patient also received Actemra for her severe COVID-19 infection which does predispose her to bacterial infections.  ID are following, currently he is on IV cefazolin, and to receive 1 dose of IV oritavancin 5/9.  Biotics on discharge   Diabetes mellitus -She was on insulin sliding scale and Lantus during hospital stay, but overall her  CBGs are low, A1c 6.2 on admission, patient was instructed to follow a low-carb diet, and to follow closely with PCP about repeat A1c and her CBGs. Marland Kitchen    Discharge Condition:  stable   Follow UP  Follow-up Information    [facility] Follow up.   Specialty: Home Health Services Why: The home health agency will contact you for the first home visit. Contact information: [place] [address] [phone]        [place] Follow up.   Why: home oxygen            Discharge Instructions  and  Discharge Medications     Discharge Instructions    Discharge instructions   Complete by: As directed    Follow with Primary [name] Erica [name], L.Erica [name], [name] in 10 days   Get CBC, CMP, 2 view Chest X ray checked  by Primary [name] next  visit.    Activity: As tolerated with Full fall precautions use walker/cane & assistance as needed   Disposition Home    Diet: Carb Modified .  On your next visit with your primary care physician please Get Medicines reviewed and adjusted.   Please request your Prim.[name] to go over all Hospital Tests and Procedure/Radiological results at the follow up, please get all Hospital records sent to your Prim [name] by signing hospital release before you go home.   If you experience worsening of your admission symptoms, develop shortness of breath, life threatening emergency, suicidal or homicidal thoughts you must seek medical attention immediately by calling 911 or calling your [name] immediately  if symptoms less severe.  You Must read complete instructions/literature along with all the possible adverse reactions/side effects for all the Medicines you take and that have been prescribed to you. Take any new Medicines after you have completely understood and accpet all the possible adverse reactions/side effects.   Do not drive, operating heavy machinery, perform activities at heights, swimming or participation in water  activities or provide baby sitting services if your were admitted for syncope or siezures until you have seen by Primary [name] or a Neurologist and advised to do so again.  Do not drive when taking Pain medications.    Do not take more than prescribed Pain, Sleep and Anxiety Medications  Special Instructions: If you have smoked or chewed Tobacco  in the last 2 yrs please stop smoking, stop any regular Alcohol  and or any Recreational drug use.  Wear Seat belts while driving.   Please note  You were cared for by a hospitalist during your hospital stay. If you have any questions about your discharge medications or the care you received while you were in the hospital after you are discharged, you can call the unit and asked to speak with the hospitalist on call if the hospitalist that took care of you is not available. Once you are discharged, your primary care physician will handle any further medical issues. Please note that NO REFILLS for any discharge medications will be authorized once you are discharged, as it is imperative that you return to your primary care physician (or establish a relationship with a primary care physician if you do not have one) for your aftercare needs so that they can reassess your need for medications and monitor your lab values.   Increase activity slowly   Complete by: As directed      Allergies as of 05/14/2019   No Known Allergies     Medication List    STOP taking these medications   etonogestrel-ethinyl estradiol 0.12-0.015 MG/24HR vaginal ring Commonly known as: NUVARING   ibuprofen 600 MG tablet Commonly known as: ADVIL   methocarbamol 500 MG tablet Commonly known as: ROBAXIN     TAKE these medications   acetaminophen 500 MG tablet Commonly known as: TYLENOL Take 500 mg by mouth every 6 (six) hours as needed for fever or headache (pain).   apixaban 2.5 MG Tabs tablet Commonly known as: Eliquis Take 1 tablet (2.5 mg total) by mouth 2 (two)  times daily.   benzonatate 200 MG capsule Commonly known as: TESSALON Take 200 mg by mouth 3 (three) times daily as needed for cough.   chlorpheniramine-HYDROcodone 10-8 MG/5ML Suer Commonly known as: TUSSIONEX Take 2.5-5 mLs by mouth every 12 (twelve) hours as needed for cough.   MAGNESIUM PO Take 1 tablet by mouth daily.   multivitamin with minerals Tabs  tablet Take 1 tablet by mouth daily.   valACYclovir 500 MG tablet Commonly known as: VALTREX Take 1 tablet (500 mg total) by mouth 2 (two) times daily. What changed:   when to take this  additional instructions            Durable Medical Equipment  (From admission, onward)         Start     Ordered   05/04/19 0757  For home use only DME oxygen  Once    Question Answer Comment  Length of Need 6 Months   Mode or (Route) Nasal cannula   Liters per Minute 3   Frequency Continuous (stationary and portable oxygen unit needed)   Oxygen delivery system Gas      05/04/19 0756            Diet and Activity recommendation: See Discharge Instructions above   Major procedures and Radiology Reports - PLEASE review detailed and final reports for all details, in brief -      CT CHEST WO CONTRAST  Result Date: 05/06/2019 CLINICAL DATA:  Chest pain, shortness of breath, pneumomediastinum, acute hypoxic respiratory failure due to COVID-19 viral pneumonia EXAM: CT CHEST WITHOUT CONTRAST TECHNIQUE: Multidetector CT imaging of the chest was performed following the standard protocol without IV contrast. Sagittal and coronal MPR images reconstructed from axial data set. COMPARISON:  None FINDINGS: Cardiovascular: Aorta normal caliber. Heart size normal. No pericardial effusion. Mediastinum/Nodes: Small hiatal hernia. Scattered pneumomediastinum few normal size mediastinal lymph nodes without thoracic adenopathy. Additional soft tissue gas is seen extending into the axilla and cervical regions bilaterally. Lungs/Pleura: Patchy  airspace infiltrates throughout both lungs consistent with multifocal pneumonia and history of COVID-19. Multiple small cavitary foci are identified within the infiltrates in both lungs, greater in lower lobes. No pleural effusion or pneumothorax. No discrete pulmonary mass/nodule. Upper Abdomen: Visualized upper abdomen unremarkable Musculoskeletal: Osseous structures unremarkable. IMPRESSION: Extensive BILATERAL patchy airspace infiltrates consistent with multifocal pneumonia. Multiple cavitary foci are identified in both lungs, greater in the lower lobes; this is uncommon with COVID-19 alone, suggesting a superimposed cavitary pneumonia or sequela of aspiration pneumonia. Pneumomediastinum with gas extending into the cervical regions and less into the axilla as above; this is of uncertain etiology, could be related to barotrauma, coughing, mechanical ventilation. Electronically Signed   By: Lavonia Dana M.D.   On: 05/06/2019 10:11   DG Chest Port 1 View  Result Date: 05/05/2019 CLINICAL DATA:  Increasing shortness of breath. EXAM: PORTABLE CHEST 1 VIEW COMPARISON:  Chest radiograph 05/03/2019 FINDINGS: There is pneumomediastinum and subcutaneous emphysema throughout the soft tissues of the visualized neck base. There are worsening bilateral diffuse airspace opacities. There is more focal consolidation in the right peripheral lung. No significant pleural effusion. Query tiny right apical pneumothorax. IMPRESSION: 1. Pneumomediastinum and subcutaneous emphysema throughout the soft tissues of the visualized neck base. 2. Interval worsening of bilateral diffuse airspace opacities. 3. Query tiny right apical pneumothorax. Electronically Signed   By: Audie Pinto M.D.   On: 05/05/2019 16:31   DG Chest Port 1 View  Result Date: 05/03/2019 CLINICAL DATA:  Shortness of breath, COVID EXAM: PORTABLE CHEST 1 VIEW COMPARISON:  04/28/2019 FINDINGS: Cardiomegaly. There is interval increase in heterogeneous bilateral,  bibasilar predominant airspace opacity. IMPRESSION: Interval increase in heterogeneous bilateral, bibasilar predominant airspace opacity, consistent with worsened multifocal infection and in keeping with COVID airspace disease. Electronically Signed   By: Eddie Candle M.D.   On: 05/03/2019 10:51  DG Chest Port 1 View  Result Date: 04/28/2019 CLINICAL DATA:  COVID-19 positive, short of breath, hypoxia EXAM: PORTABLE CHEST 1 VIEW COMPARISON:  None. FINDINGS: Single frontal view of the chest demonstrates an unremarkable cardiac silhouette. Diffuse interstitial prominence with patchy bilateral ground-glass opacities greatest at the lung bases. No effusion or pneumothorax. No acute bony abnormalities. IMPRESSION: 1. Multifocal pneumonia compatible with COVID-19.  The Electronically Signed   By: Randa Ngo M.D.   On: 04/28/2019 16:47   VAS [place] LOWER EXTREMITY VENOUS (DVT)  Result Date: 05/03/2019  Lower Venous DVTStudy Indications: Edema, and Swelling.  Comparison Study: no prior Performing Technologist: Abram Sander RVS  Examination Guidelines: A complete evaluation includes B-mode imaging, spectral Doppler, color Doppler, and power Doppler as needed of all accessible portions of each vessel. Bilateral testing is considered an integral part of a complete examination. Limited examinations for reoccurring indications may be performed as noted. The reflux portion of the exam is performed with the patient in reverse Trendelenburg.  +---------+---------------+---------+-----------+----------+--------------+ RIGHT    CompressibilityPhasicitySpontaneityPropertiesThrombus Aging +---------+---------------+---------+-----------+----------+--------------+ CFV      Full           Yes      Yes                                 +---------+---------------+---------+-----------+----------+--------------+ SFJ      Full                                                         +---------+---------------+---------+-----------+----------+--------------+ FV Prox  Full                                                        +---------+---------------+---------+-----------+----------+--------------+ FV Mid   Full                                                        +---------+---------------+---------+-----------+----------+--------------+ FV DistalFull                                                        +---------+---------------+---------+-----------+----------+--------------+ PFV      Full                                                        +---------+---------------+---------+-----------+----------+--------------+ POP      Full           Yes      Yes                                 +---------+---------------+---------+-----------+----------+--------------+  PTV      Full                                                        +---------+---------------+---------+-----------+----------+--------------+ PERO                                                  Not visualized +---------+---------------+---------+-----------+----------+--------------+   +---------+---------------+---------+-----------+----------+--------------+ LEFT     CompressibilityPhasicitySpontaneityPropertiesThrombus Aging +---------+---------------+---------+-----------+----------+--------------+ CFV      Full           Yes      Yes                                 +---------+---------------+---------+-----------+----------+--------------+ SFJ      Full                                                        +---------+---------------+---------+-----------+----------+--------------+ FV Prox  Full                                                        +---------+---------------+---------+-----------+----------+--------------+ FV Mid   Full                                                         +---------+---------------+---------+-----------+----------+--------------+ FV DistalFull                                                        +---------+---------------+---------+-----------+----------+--------------+ PFV      Full                                                        +---------+---------------+---------+-----------+----------+--------------+ POP      Full           Yes      Yes                                 +---------+---------------+---------+-----------+----------+--------------+ PTV      Full                                                        +---------+---------------+---------+-----------+----------+--------------+  PERO                                                  Not visualized +---------+---------------+---------+-----------+----------+--------------+     Summary: BILATERAL: - No evidence of deep vein thrombosis seen in the lower extremities, bilaterally.   *See table(s) above for measurements and observations. Electronically signed by Curt [name] [name] on 05/03/2019 at 4:26:06 PM.    Final    ECHOCARDIOGRAM LIMITED  Result Date: 05/07/2019    ECHOCARDIOGRAM LIMITED REPORT   Patient Name:   Erica [name] Date of Exam: 05/07/2019 Medical Rec #:  [id number]    Height:       65.0 in Accession #:    [id number]   Weight:       238.1 lb Date of Birth:  04/23/1970    BSA:          2.130 m Patient Age:    11 years     BP:           112/61 mmHg Patient Gender: F            HR:           84 bpm. Exam Location:  Inpatient Procedure: Limited Echo, Cardiac Doppler and Color Doppler Indications:    CHF  History:        Patient has no prior history of Echocardiogram examinations.                 Signs/Symptoms:Shortness of Breath. COVID POSITIVE, pneumonia,                 Obesity.  Sonographer:    Dustin Flock Referring Phys: W[place]ly [place]  1. Left ventricular ejection fraction, by estimation, is 65 to 70%. The left ventricle has normal  function. The left ventricle has no regional wall motion abnormalities. Left ventricular diastolic parameters are consistent with Grade I diastolic dysfunction (impaired relaxation).  2. Right ventricular systolic function is normal. The right ventricular size is normal.  3. The mitral valve is normal in structure. No evidence of mitral valve regurgitation. No evidence of mitral stenosis.  4. The aortic valve is normal in structure. Aortic valve regurgitation is not visualized. No aortic stenosis is present.  5. The inferior vena cava is normal in size with greater than 50% respiratory variability, suggesting right atrial pressure of 3 mmHg. FINDINGS  Left Ventricle: Left ventricular ejection fraction, by estimation, is 65 to 70%. The left ventricle has normal function. The left ventricle has no regional wall motion abnormalities. The left ventricular internal cavity size was normal in size. There is  no left ventricular hypertrophy. Right Ventricle: The right ventricular size is normal. No increase in right ventricular wall thickness. Right ventricular systolic function is normal. Left Atrium: Left atrial size was normal in size. Right Atrium: Right atrial size was normal in size. Pericardium: There is no evidence of pericardial effusion. Mitral Valve: The mitral valve is normal in structure. Normal mobility of the mitral valve leaflets. No evidence of mitral valve stenosis. Tricuspid Valve: The tricuspid valve is normal in structure. Tricuspid valve regurgitation is not demonstrated. No evidence of tricuspid stenosis. Aortic Valve: The aortic valve is normal in structure. Aortic valve regurgitation is not visualized. No aortic stenosis is present. Pulmonic Valve: The pulmonic valve was normal in structure. Pulmonic valve  regurgitation is not visualized. No evidence of pulmonic stenosis. Aorta: The aortic root is normal in size and structure. Venous: The inferior vena cava is normal in size with greater than 50%  respiratory variability, suggesting right atrial pressure of 3 mmHg. IAS/Shunts: No atrial level shunt detected by color flow Doppler.  LEFT VENTRICLE PLAX 2D LVIDd:         4.00 cm  Diastology LVIDs:         2.55 cm  LV e' lateral:   7.40 cm/s LV PW:         0.97 cm  LV E/e' lateral: 7.2 LV IVS:        1.03 cm  LV e' medial:    5.33 cm/s LVOT diam:     1.70 cm  LV E/e' medial:  10.0 LVOT Area:     2.27 cm  RIGHT VENTRICLE RV S prime:     5.11 cm/s LEFT ATRIUM         Index LA diam:    2.80 cm 1.31 cm/m   AORTA Ao Root diam: 2.10 cm MITRAL VALVE MV Area (PHT): 3.48 cm    SHUNTS MV Decel Time: 218 msec    Systemic Diam: 1.70 cm MV E velocity: 53.10 cm/s MV A velocity: 63.00 cm/s MV E/A ratio:  0.84 Erica [name] Electronically signed by Erica [name] Signature Date/Time: 05/07/2019/3:18:45 PM    Final     Micro Results    Recent Results (from the past 240 hour(s))  Culture, blood (routine x 2)     Status: None   Collection Time: 05/06/19 10:44 AM   Specimen: BLOOD  Result Value Ref Range Status   Specimen Description BLOOD SITE NOT SPECIFIED  Final   Special Requests   Final    BOTTLES DRAWN AEROBIC ONLY Blood Culture results may not be optimal due to an inadequate volume of blood received in culture bottles   Culture   Final    NO GROWTH 5 DAYS Performed at [facility], [place] [address] [place], [place] [address]    Report Status 05/11/2019 FINAL  Final  Culture, blood (routine x 2)     Status: Abnormal   Collection Time: 05/06/19 10:47 AM   Specimen: BLOOD RIGHT ARM  Result Value Ref Range Status   Specimen Description BLOOD RIGHT ARM  Final   Special Requests   Final    BOTTLES DRAWN AEROBIC AND ANAEROBIC Blood Culture adequate volume   Culture  Setup Time   Final    GRAM POSITIVE COCCI IN CLUSTERS ANAEROBIC BOTTLE ONLY CRITICAL RESULT CALLED TO, READ BACK BY AND VERIFIED WITH: TJaneice Robinson PHARMD, AT 1059 05/07/19 BY D. VANHOOK Performed at [facility], [place]  [address], [place], [place] [address]    Culture STAPHYLOCOCCUS AUREUS (A)  Final   Report Status 05/09/2019 FINAL  Final   Organism ID, Bacteria STAPHYLOCOCCUS AUREUS  Final      Susceptibility   Staphylococcus aureus - MIC*    CIPROFLOXACIN <=0.5 SENSITIVE Sensitive     ERYTHROMYCIN <=0.25 SENSITIVE Sensitive     GENTAMICIN <=0.5 SENSITIVE Sensitive     OXACILLIN <=0.25 SENSITIVE Sensitive     TETRACYCLINE <=1 SENSITIVE Sensitive     VANCOMYCIN 1 SENSITIVE Sensitive     TRIMETH/SULFA <=10 SENSITIVE Sensitive     CLINDAMYCIN <=0.25 SENSITIVE Sensitive     RIFAMPIN <=0.5 SENSITIVE Sensitive     Inducible Clindamycin NEGATIVE Sensitive     * STAPHYLOCOCCUS  AUREUS  Blood Culture ID Panel (Reflexed)     Status: Abnormal   Collection Time: 05/06/19 10:47 AM  Result Value Ref Range Status   Enterococcus species NOT DETECTED NOT DETECTED Final   Listeria monocytogenes NOT DETECTED NOT DETECTED Final   Staphylococcus species DETECTED (A) NOT DETECTED Final    Comment: CRITICAL RESULT CALLED TO, READ BACK BY AND VERIFIED WITH: T. BAUMEISTER PHARMD, AT 1059 05/07/19 BY D. VANHOOK    Staphylococcus aureus (BCID) DETECTED (A) NOT DETECTED Final    Comment: Methicillin (oxacillin) susceptible Staphylococcus aureus (MSSA). Preferred therapy is anti staphylococcal beta lactam antibiotic (Cefazolin or Nafcillin), unless clinically contraindicated. CRITICAL RESULT CALLED TO, READ BACK BY AND VERIFIED WITH: T. BAUMEISTER PHARMD, AT 1059 05/07/19 BY D. VANHOOK    Methicillin resistance NOT DETECTED NOT DETECTED Final   Streptococcus species NOT DETECTED NOT DETECTED Final   Streptococcus agalactiae NOT DETECTED NOT DETECTED Final   Streptococcus pneumoniae NOT DETECTED NOT DETECTED Final   Streptococcus pyogenes NOT DETECTED NOT DETECTED Final   Acinetobacter baumannii NOT DETECTED NOT DETECTED Final   Enterobacteriaceae species NOT DETECTED NOT DETECTED Final   Enterobacter cloacae complex NOT DETECTED NOT  DETECTED Final   Escherichia coli NOT DETECTED NOT DETECTED Final   Klebsiella oxytoca NOT DETECTED NOT DETECTED Final   Klebsiella pneumoniae NOT DETECTED NOT DETECTED Final   Proteus species NOT DETECTED NOT DETECTED Final   Serratia marcescens NOT DETECTED NOT DETECTED Final   Haemophilus influenzae NOT DETECTED NOT DETECTED Final   Neisseria meningitidis NOT DETECTED NOT DETECTED Final   Pseudomonas aeruginosa NOT DETECTED NOT DETECTED Final   Candida albicans NOT DETECTED NOT DETECTED Final   Candida glabrata NOT DETECTED NOT DETECTED Final   Candida krusei NOT DETECTED NOT DETECTED Final   Candida parapsilosis NOT DETECTED NOT DETECTED Final   Candida tropicalis NOT DETECTED NOT DETECTED Final    Comment: Performed at [facility], [place]. [address], [place], [place] [address]  MRSA PCR Screening     Status: Abnormal   Collection Time: 05/06/19 10:59 AM   Specimen: Nasal Mucosa; Nasopharyngeal  Result Value Ref Range Status   MRSA by PCR POSITIVE (A) NEGATIVE Final    Comment:        The GeneXpert MRSA Assay (FDA approved for NASAL specimens only), is one component of a comprehensive MRSA colonization surveillance program. It is not intended to diagnose MRSA infection nor to guide or monitor treatment for MRSA infections. CRITICAL RESULT CALLED TO, READ BACK BY AND VERIFIED WITH: RN Salley Slaughter [id number] AT 1414 BY CM Performed at [facility], [place] [address], [place], [place] [address]   Expectorated sputum assessment w rflx to resp cult     Status: None   Collection Time: 05/06/19  1:00 PM   Specimen: Expectorated Sputum  Result Value Ref Range Status   Specimen Description Expect. Sput  Final   Special Requests Normal  Final   Sputum evaluation   Final    THIS SPECIMEN IS ACCEPTABLE FOR SPUTUM CULTURE Performed at [facility], [place] [address] [place], [place] [address]    Report Status 05/06/2019 FINAL  Final  Culture, respiratory     Status: None    Collection Time: 05/06/19  1:00 PM  Result Value Ref Range Status   Specimen Description Expect. Sput  Final   Special Requests Normal Reflexed from [id number]  Final   Gram Stain   Final    RARE WBC PRESENT,  PREDOMINANTLY PMN FEW GRAM POSITIVE COCCI IN PAIRS FEW GRAM NEGATIVE RODS FEW GRAM POSITIVE RODS    Culture   Final    ABUNDANT Consistent with normal respiratory flora. Performed at [facility], [place] [address], [place], [place] [address]    Report Status 05/08/2019 FINAL  Final  Culture, blood (routine x 2)     Status: None   Collection Time: 05/07/19  9:02 PM   Specimen: BLOOD RIGHT ARM  Result Value Ref Range Status   Specimen Description BLOOD RIGHT ARM  Final   Special Requests   Final    BOTTLES DRAWN AEROBIC ONLY Blood Culture results may not be optimal due to an inadequate volume of blood received in culture bottles   Culture   Final    NO GROWTH 5 DAYS Performed at [facility], [place] [address], [place], [place] [address]    Report Status 05/12/2019 FINAL  Final  Culture, blood (routine x 2)     Status: None   Collection Time: 05/07/19  9:02 PM   Specimen: BLOOD RIGHT ARM  Result Value Ref Range Status   Specimen Description BLOOD RIGHT ARM  Final   Special Requests   Final    BOTTLES DRAWN AEROBIC ONLY Blood Culture adequate volume   Culture   Final    NO GROWTH 5 DAYS Performed at [facility], [place] [address], [place],  [address]    Report Status 05/12/2019 FINAL  Final       Today   Subjective:   Erica [name] today has no headache,no chest or abdominal pain,no new weakness tingling or numbness, feels much better wants to go home today.  Patient was ambulated on the hallway with the nurse before discharge, where she remained 92 to 95% on room air with no hypoxia with activity.  Objective:   Blood pressure 122/72, pulse 77, temperature 98.2 F (36.8 C), temperature source Oral, resp. rate 18, height 5\' 5"  (1.651 m), weight 108 kg,  last menstrual period 04/06/2019, SpO2 97 %.   Intake/Output Summary (Last 24 hours) at 05/14/2019 1315 Last data filed at 05/14/2019 0615 Gross per 24 hour  Intake 240 ml  Output --  Net 240 ml    Exam Awake Alert, Oriented x 3, No new F.N deficits, Normal affect  Symmetrical Chest wall movement, Good air movement bilaterally, CTAB RRR,No Gallops,Rubs or new Murmurs, No Parasternal Heave +ve B.Sounds, Abd Soft, Non tender, No rebound -guarding or rigidity. No Cyanosis, Clubbing or edema, No new Rash or bruise  Data Review   CBC w Diff:  Lab Results  Component Value Date   WBC 8.4 05/14/2019   HGB 13.2 05/14/2019   HCT 40.0 05/14/2019   PLT 131 (L) 05/14/2019   LYMPHOPCT 32 05/10/2019   MONOPCT 11 05/10/2019   EOSPCT 2 05/10/2019   BASOPCT 1 05/10/2019    CMP:  Lab Results  Component Value Date   NA 138 05/14/2019   K 3.9 05/14/2019   CL 107 05/14/2019   CO2 21 (L) 05/14/2019   BUN 9 05/14/2019   CREATININE 0.98 05/14/2019   PROT 5.5 (L) 05/10/2019   ALBUMIN 2.6 (L) 05/10/2019   BILITOT 0.7 05/10/2019   ALKPHOS 60 05/10/2019   AST 43 (H) 05/10/2019   ALT 51 (H) 05/10/2019  .   Total Time in preparing paper work, data evaluation and todays exam - 65 minutes  Phillips Climes M.D on 05/14/2019 at 1:15 PM  [facility]  Office  [phone]

## 2019-05-14 NOTE — Discharge Instructions (Signed)
COVID-19 Frequently Asked Questions COVID-19 (coronavirus disease) is an infection that is caused by a large family of viruses. Some viruses cause illness in people and others cause illness in animals like camels, cats, and bats. In some cases, the viruses that cause illness in animals can spread to humans. Where did the coronavirus come from? In December 2019, [place] told the [facility]) of several cases of lung disease (human respiratory illness). These cases were linked to an open seafood and livestock market in the city of [place]. The link to the seafood and livestock market suggests that the virus may have spread from animals to humans. However, since that first outbreak in December, the virus has also been shown to spread from person to person. What is the name of the disease and the virus? Disease name Early on, this disease was called novel coronavirus. This is because scientists determined that the disease was caused by a new (novel) respiratory virus. The World Health Organization [facility]) has now named the disease COVID-19, or coronavirus disease. Virus name The virus that causes the disease is called severe acute respiratory syndrome coronavirus 2 (SARS-CoV-2). More information on disease and virus naming World Health Organization [facility]): www.who.int/emergencies/diseases/novel-coronavirus-2019/technical-guidance/naming-the-coronavirus-disease-(covid-2019)-and-the-virus-that-causes-it Who is at risk for complications from coronavirus disease? Some people may be at higher risk for complications from coronavirus disease. This includes older adults and people who have chronic diseases, such as heart disease, diabetes, and lung disease. If you are at higher risk for complications, take these extra precautions:  Stay home as much as possible.  Avoid social gatherings and travel.  Avoid close contact with others. Stay at least 6 ft (2 m) away from others, if  possible.  Wash your hands often with soap and water for at least 20 seconds.  Avoid touching your face, mouth, nose, or eyes.  Keep supplies on hand at home, such as food, medicine, and cleaning supplies.  If you must go out in public, wear a cloth face covering or face mask. Make sure your mask covers your nose and mouth. How does coronavirus disease spread? The virus that causes coronavirus disease spreads easily from person to person (is contagious). You may catch the virus by:  Breathing in droplets from an infected person. Droplets can be spread by a person breathing, speaking, singing, coughing, or sneezing.  Touching something, like a table or a doorknob, that was exposed to the virus (contaminated) and then touching your mouth, nose, or eyes. Can I get the virus from touching surfaces or objects? There is still a lot that we do not know about the virus that causes coronavirus disease. Scientists are basing a lot of information on what they know about similar viruses, such as:  Viruses cannot generally survive on surfaces for long. They need a human body (host) to survive.  It is more likely that the virus is spread by close contact with people who are sick (direct contact), such as through: ? Shaking hands or hugging. ? Breathing in respiratory droplets that travel through the air. Droplets can be spread by a person breathing, speaking, singing, coughing, or sneezing.  It is less likely that the virus is spread when a person touches a surface or object that has the virus on it (indirect contact). The virus may be able to enter the body if the person touches a surface or object and then touches his or her face, eyes, nose, or mouth. Can a person spread the virus without having symptoms of the  disease? It may be possible for the virus to spread before a person has symptoms of the disease, but this is most likely not the main way the virus is spreading. It is more likely for the virus  to spread by being in close contact with people who are sick and breathing in the respiratory droplets spread by a person breathing, speaking, singing, coughing, or sneezing. What are the symptoms of coronavirus disease? Symptoms vary from person to person and can range from mild to severe. Symptoms may include:  Fever or chills.  Cough.  Difficulty breathing or feeling short of breath.  Headaches, body aches, or muscle aches.  Runny or stuffy (congested) nose.  Sore throat.  New loss of taste or smell.  Nausea, vomiting, or diarrhea. These symptoms can appear anywhere from 2 to 14 days after you have been exposed to the virus. Some people may not have any symptoms. If you develop symptoms, call your health care provider. People with severe symptoms may need hospital care. Should I be tested for this virus? Your health care provider will decide whether to test you based on your symptoms, history of exposure, and your risk factors. How does a health care provider test for this virus? Health care providers will collect samples to send for testing. Samples may include:  Taking a swab of fluid from the back of your nose and throat, your nose, or your throat.  Taking fluid from the lungs by having you cough up mucus (sputum) into a sterile cup.  Taking a blood sample. Is there a treatment or vaccine for this virus? Currently, there is no vaccine to prevent coronavirus disease. Also, there are no medicines like antibiotics or antivirals to treat the virus. A person who becomes sick is given supportive care, which means rest and fluids. A person may also relieve his or her symptoms by using over-the-counter medicines that treat sneezing, coughing, and runny nose. These are the same medicines that a person takes for the common cold. If you develop symptoms, call your health care provider. People with severe symptoms may need hospital care. What can I do to protect myself and my family from  this virus?     You can protect yourself and your family by taking the same actions that you would take to prevent the spread of other viruses. Take the following actions:  Wash your hands often with soap and water for at least 20 seconds. If soap and water are not available, use alcohol-based hand sanitizer.  Avoid touching your face, mouth, nose, or eyes.  Cough or sneeze into a tissue, sleeve, or elbow. Do not cough or sneeze into your hand or the air. ? If you cough or sneeze into a tissue, throw it away immediately and wash your hands.  Disinfect objects and surfaces that you frequently touch every day.  Stay away from people who are sick.  Avoid going out in public, follow guidance from your state and local health authorities.  Avoid crowded indoor spaces. Stay at least 6 ft (2 m) away from others.  If you must go out in public, wear a cloth face covering or face mask. Make sure your mask covers your nose and mouth.  Stay home if you are sick, except to get medical care. Call your health care provider before you get medical care. Your health care provider will tell you how long to stay home.  Make sure your vaccines are up to date. Ask your health care provider  what vaccines you need. What should I do if I need to travel? Follow travel recommendations from your local health authority, the CDC, and WHO. Travel information and advice  Centers for Disease Control and Prevention (CDC): BodyEditor.hu  World Health Organization [facility]): ThirdIncome.ca Know the risks and take action to protect your health  You are at higher risk of getting coronavirus disease if you are traveling to areas with an outbreak or if you are exposed to travelers from areas with an outbreak.  Wash your hands often and practice good hygiene to lower the risk of catching or spreading the virus. What should I do  if I am sick? General instructions to stop the spread of infection  Wash your hands often with soap and water for at least 20 seconds. If soap and water are not available, use alcohol-based hand sanitizer.  Cough or sneeze into a tissue, sleeve, or elbow. Do not cough or sneeze into your hand or the air.  If you cough or sneeze into a tissue, throw it away immediately and wash your hands.  Stay home unless you must get medical care. Call your health care provider or local health authority before you get medical care.  Avoid public areas. Do not take public transportation, if possible.  If you can, wear a mask if you must go out of the house or if you are in close contact with someone who is not sick. Make sure your mask covers your nose and mouth. Keep your home clean  Disinfect objects and surfaces that are frequently touched every day. This may include: ? Counters and tables. ? Doorknobs and light switches. ? Sinks and faucets. ? Electronics such as phones, remote controls, keyboards, computers, and tablets.  Wash dishes in hot, soapy water or use a dishwasher. Air-dry your dishes.  Wash laundry in hot water. Prevent infecting other household members  Let healthy household members care for children and pets, if possible. If you have to care for children or pets, wash your hands often and wear a mask.  Sleep in a different bedroom or bed, if possible.  Do not share personal items, such as razors, toothbrushes, deodorant, combs, brushes, towels, and washcloths. Where to find more information Centers for Disease Control and Prevention (CDC)  Information and news updates: https://www.butler-gonzalez.com/ World Health Organization [facility])  Information and news updates: MissExecutive.com.ee  Coronavirus health topic: https://www.castaneda.info/  Questions and answers on COVID-19:  OpportunityDebt.at  Global tracker: who.sprinklr.com American Academy of Pediatrics (AAP)  Information for families: www.healthychildren.org/English/health-issues/conditions/chest-lungs/Pages/2019-Novel-Coronavirus.aspx The coronavirus situation is changing rapidly. Check your local health authority website or the CDC and [facility] websites for updates and news. When should I contact a health care provider?  Contact your health care provider if you have symptoms of an infection, such as fever or cough, and you: ? Have been near anyone who is known to have coronavirus disease. ? Have come into contact with a person who is suspected to have coronavirus disease. ? Have traveled to an area where there is an outbreak of COVID-19. When should I get emergency medical care?  Get help right away by calling your local emergency services (911 in the [place] if you have: ? Trouble breathing. ? Pain or pressure in your chest. ? Confusion. ? Blue-tinged lips and fingernails. ? Difficulty waking from sleep. ? Symptoms that get worse. Let the emergency medical personnel know if you think you have coronavirus disease. Summary  A new respiratory virus is spreading from person to person and  causing COVID-19 (coronavirus disease).  The virus that causes COVID-19 appears to spread easily. It spreads from one person to another through droplets from breathing, speaking, singing, coughing, or sneezing.  Older adults and those with chronic diseases are at higher risk of disease. If you are at higher risk for complications, take extra precautions.  There is currently no vaccine to prevent coronavirus disease. There are no medicines, such as antibiotics or antivirals, to treat the virus.  You can protect yourself and your family by washing your hands often, avoiding touching your face, and covering your coughs and sneezes. This information is not intended to replace advice given to you  by your health care provider. Make sure you discuss any questions you have with your health care provider. Document Revised: 10/20/2018 Document Reviewed: 04/18/2018 Elsevier Patient Education  [place].   Acute Respiratory Failure, Adult  Acute respiratory failure occurs when there is not enough oxygen passing from your lungs to your body. When this happens, your lungs have trouble removing carbon dioxide from the blood. This causes your blood oxygen level to drop too low as carbon dioxide builds up. Acute respiratory failure is a medical emergency. It can develop quickly, but it is temporary if treated promptly. Your lung capacity, or how much air your lungs can hold, may improve with time, exercise, and treatment. What are the causes? There are many possible causes of acute respiratory failure, including:  Lung injury.  Chest injury or damage to the ribs or tissues near the lungs.  Lung conditions that affect the flow of air and blood into and out of the lungs, such as pneumonia, acute respiratory distress syndrome, and cystic fibrosis.  Medical conditions, such as strokes or spinal cord injuries, that affect the muscles and nerves that control breathing.  Blood infection (sepsis).  Inflammation of the pancreas (pancreatitis).  A blood clot in the lungs (pulmonary embolism).  A large-volume blood transfusion.  Burns.  Near-drowning.  Seizure.  Smoke inhalation.  Reaction to medicines.  Alcohol or drug overdose. What increases the risk? This condition is more likely to develop in people who have:  A blocked airway.  Asthma.  A condition or disease that damages or weakens the muscles, nerves, bones, or tissues that are involved in breathing.  A serious infection.  A health problem that blocks the unconscious reflex that is involved in breathing, such as hypothyroidism or sleep apnea.  A lung injury or trauma. What are the signs or symptoms? Trouble  breathing is the main symptom of acute respiratory failure. Symptoms may also include:  Rapid breathing.  Restlessness or anxiety.  Skin, lips, or fingernails that appear blue (cyanosis).  Rapid heart rate.  Abnormal heart rhythms (arrhythmias).  Confusion or changes in behavior.  Tiredness or loss of energy.  Feeling sleepy or having a loss of consciousness. How is this diagnosed? Your health care provider can diagnose acute respiratory failure with a medical history and physical exam. During the exam, your health care provider will listen to your heart and check for crackling or wheezing sounds in your lungs. Your may also have tests to confirm the diagnosis and determine what is causing respiratory failure. These tests may include:  Measuring the amount of oxygen in your blood (pulse oximetry). The measurement comes from a small device that is placed on your finger, earlobe, or toe.  Other blood tests to measure blood gases and to look for signs of infection.  Sampling your cerebral spinal fluid or tracheal  fluid to check for infections.  Chest X-ray to look for fluid in spaces that should be filled with air.  Electrocardiogram (ECG) to look at the heart's electrical activity. How is this treated? Treatment for this condition usually takes places in a hospital intensive care unit (ICU). Treatment depends on what is causing the condition. It may include one or more treatments until your symptoms improve. Treatment may include:  Supplemental oxygen. Extra oxygen is given through a tube in the nose, a face mask, or a hood.  A device such as a continuous positive airway pressure (CPAP) or bi-level positive airway pressure (BiPAP or BPAP) machine. This treatment uses mild air pressure to keep the airways open. A mask or other device will be placed over your nose or mouth. A tube that is connected to a motor will deliver oxygen through the mask.  Ventilator. This treatment helps move  air into and out of the lungs. This may be done with a bag and mask or a machine. For this treatment, a tube is placed in your windpipe (trachea) so air and oxygen can flow to the lungs.  Extracorporeal membrane oxygenation (ECMO). This treatment temporarily takes over the function of the heart and lungs, supplying oxygen and removing carbon dioxide. ECMO gives the lungs a chance to recover. It may be used if a ventilator is not effective.  Tracheostomy. This is a procedure that creates a hole in the neck to insert a breathing tube.  Receiving fluids and medicines.  Rocking the bed to help breathing. Follow these instructions at home:  Take over-the-counter and prescription medicines only as told by your health care provider.  Return to normal activities as told by your health care provider. Ask your health care provider what activities are safe for you.  Keep all follow-up visits as told by your health care provider. This is important. How is this prevented? Treating infections and medical conditions that may lead to acute respiratory failure can help prevent the condition from developing. Contact a health care provider if:  You have a fever.  Your symptoms do not improve or they get worse. Get help right away if:  You are having trouble breathing.  You lose consciousness.  Your have cyanosis or turn blue.  You develop a rapid heart rate.  You are confused. These symptoms may represent a serious problem that is an emergency. Do not wait to see if the symptoms will go away. Get medical help right away. Call your local emergency services (911 in the [place] Do not drive yourself to the hospital. This information is not intended to replace advice given to you by your health care provider. Make sure you discuss any questions you have with your health care provider. Document Revised: 12/03/2016 Document Reviewed: 07/09/2015 Elsevier Patient Education  2020 [place].  COVID-19:  How to Protect Yourself and Others Know how it spreads  There is currently no vaccine to prevent coronavirus disease 2019 (COVID-19).  The best way to prevent illness is to avoid being exposed to this virus.  The virus is thought to spread mainly from person-to-person. ? Between people who are in close contact with one another (within about 6 feet). ? Through respiratory droplets produced when an infected person coughs, sneezes or talks. ? These droplets can land in the mouths or noses of people who are nearby or possibly be inhaled into the lungs. ? COVID-19 may be spread by people who are not showing symptoms. Everyone should Clean your hands  often  Wash your hands often with soap and water for at least 20 seconds especially after you have been in a public place, or after blowing your nose, coughing, or sneezing.  If soap and water are not readily available, use a hand sanitizer that contains at least 60% alcohol. Cover all surfaces of your hands and rub them together until they feel dry.  Avoid touching your eyes, nose, and mouth with unwashed hands. Avoid close contact  Limit contact with others as much as possible.  Avoid close contact with people who are sick.  Put distance between yourself and other people. ? Remember that some people without symptoms may be able to spread virus. ? This is especially important for people who are at higher risk of getting very GainPain.com.cy Cover your mouth and nose with a mask when around others  You could spread COVID-19 to others even if you do not feel sick.  Everyone should wear a mask in public settings and when around people not living in their household, especially when social distancing is difficult to maintain. ? Masks should not be placed on young children under age 66, anyone who has trouble breathing, or is unconscious, incapacitated or otherwise unable to  remove the mask without assistance.  The mask is meant to protect other people in case you are infected.  Do NOT use a facemask meant for a Dietitian.  Continue to keep about 6 feet between yourself and others. The mask is not a substitute for social distancing. Cover coughs and sneezes  Always cover your mouth and nose with a tissue when you cough or sneeze or use the inside of your elbow.  Throw used tissues in the trash.  Immediately wash your hands with soap and water for at least 20 seconds. If soap and water are not readily available, clean your hands with a hand sanitizer that contains at least 60% alcohol. Clean and disinfect  Clean AND disinfect frequently touched surfaces daily. This includes tables, doorknobs, light switches, countertops, handles, desks, phones, keyboards, toilets, faucets, and sinks. RackRewards.fr  If surfaces are dirty, clean them: Use detergent or soap and water prior to disinfection.  Then, use a household disinfectant. You can see a list of EPA-registered household disinfectants here. michellinders.com 09/06/2018 This information is not intended to replace advice given to you by your health care provider. Make sure you discuss any questions you have with your health care provider. Document Revised: 09/14/2018 Document Reviewed: 07/13/2018 Elsevier Patient Education  [place] on my medicine - ELIQUIS (apixaban)  This medication education was reviewed with me or my healthcare representative as part of my discharge preparation.  Why was Eliquis prescribed for you? Eliquis was prescribed for you to reduce the risk of a blood clot forming that can cause a stroke, due to high risk of blood clot in patient with Covid infection. What do You need to know about Eliquis ? Take your Eliquis TWICE DAILY - one tablet in the morning and one tablet in the  evening with or without food. If you have difficulty swallowing the tablet whole please discuss with your pharmacist how to take the medication safely.    Take Eliquis exactly as prescribed by your doctor and DO NOT stop taking Eliquis without talking to the doctor who prescribed the medication.  Stopping may increase your risk of developing a stroke.    Current plan is for only one month of Eliquis.  After discharge, you should have regular  check-up appointments with your healthcare provider that is prescribing your Eliquis.  In the future your dose may need to be changed if your kidney function or weight changes by a significant amount or as you get older.  What do you do if you miss a dose? If you miss a dose, take it as soon as you remember on the same day and resume taking twice daily.  Do not take more than one dose of ELIQUIS at the same time to make up a missed dose.  Important Safety Information A possible side effect of Eliquis is bleeding. You should call your healthcare provider right away if you experience any of the following: ? Bleeding from an injury or your nose that does not stop. ? Unusual colored urine (red or dark brown) or unusual colored stools (red or black). ? Unusual bruising for unknown reasons. ? A serious fall or if you hit your head (even if there is no bleeding).  Some medicines may interact with Eliquis and might increase your risk of bleeding or clotting while on Eliquis. To help avoid this, consult your healthcare provider or pharmacist prior to using any new prescription or non-prescription medications, including herbals, vitamins, non-steroidal anti-inflammatory drugs (NSAIDs) and supplements.  This website has more information on Eliquis (apixaban): http://www.eliquis.com/eliquis/home

## 2019-05-14 NOTE — Progress Notes (Signed)
D/c instructions provided to pt. Eliquis and cough medication brought to bedside by TOC. Pt has no questions at this time.

## 2019-05-14 NOTE — TOC Benefit Eligibility Note (Signed)
Transition of Care [facility]) Benefit Eligibility Note    Patient Details  Name: Erica [name] MRN: [id number] Date of Birth: 02-10-1970   Medication/Dose: Eliquis  Covered?: Yes     Prescription Coverage [facility]  Spoke with Person/Company/Phone Number:: [facility] [address] ( patients preferred [facility])  Co-Pay: $0  Prior Approval: No          Delorse Lek Phone Number: 05/14/2019, 10:10 AM

## 2019-05-14 NOTE — TOC Progression Note (Signed)
Transition of Care (TOC) - Progression Note  Pt deemed stable for discharge home today. CM informed by attending that pt do not in fact need home oxygen - [employer] aware to retrieve equipment.  Pt declined RW and 3:1. Alvis Lemmings informed that pt will discharge home today.  TOC will deliver discharge meds. Discharge order signed - CM signing off  Patient Details  Name: Erica [name] MRN: [id number] Date of Birth: 22-Jul-1970  Transition of Care [facility]) CM/SW Contact  Maryclare Labrador, RN Phone Number: 05/14/2019, 1:20 PM  Clinical Narrative:       Expected Discharge Plan: [place] Barriers to Discharge: Continued Medical Work up  Expected Discharge Plan and Services Expected Discharge Plan: [place]   Discharge Planning Services: CM Consult Post Acute Care Choice: Durable Medical Equipment   Expected Discharge Date: 05/14/19               DME Arranged: Oxygen DME Agency: [employer] Date DME Agency Contacted: 05/07/19 Time DME Agency Contacted: [phone] Representative spoke with at DME Agency: [place]: PT [place]: [place] Date [place]: 05/04/19   Representative spoke with at [place]: [place] ([place]) Interventions    Readmission Risk Interventions No flowsheet data found.

## 2019-05-14 NOTE — Progress Notes (Signed)
Pt 92-95% while walking on RA. Pt does not need oxygen at home.

## 2019-06-13 DIAGNOSIS — Z1231 Encounter for screening mammogram for malignant neoplasm of breast: Secondary | ICD-10-CM | POA: Diagnosis not present

## 2019-06-13 DIAGNOSIS — Z01419 Encounter for gynecological examination (general) (routine) without abnormal findings: Secondary | ICD-10-CM | POA: Diagnosis not present

## 2019-06-13 DIAGNOSIS — Z1211 Encounter for screening for malignant neoplasm of colon: Secondary | ICD-10-CM | POA: Diagnosis not present

## 2019-06-13 DIAGNOSIS — Z3040 Encounter for surveillance of contraceptives, unspecified: Secondary | ICD-10-CM | POA: Diagnosis not present

## 2019-06-25 ENCOUNTER — Other Ambulatory Visit: Payer: Self-pay | Admitting: Family Medicine

## 2019-06-25 ENCOUNTER — Ambulatory Visit
Admission: RE | Admit: 2019-06-25 | Discharge: 2019-06-25 | Disposition: A | Discharge status: Home / Self Care | Payer: BC Managed Care – PPO | Source: Ambulatory Visit | Attending: Family Medicine | Admitting: Family Medicine

## 2019-06-25 DIAGNOSIS — Z09 Encounter for follow-up examination after completed treatment for conditions other than malignant neoplasm: Secondary | ICD-10-CM

## 2019-06-25 DIAGNOSIS — B948 Sequelae of other specified infectious and parasitic diseases: Secondary | ICD-10-CM | POA: Diagnosis not present

## 2019-07-24 ENCOUNTER — Other Ambulatory Visit: Payer: Self-pay | Admitting: Family Medicine

## 2019-07-24 ENCOUNTER — Ambulatory Visit
Admission: RE | Admit: 2019-07-24 | Discharge: 2019-07-24 | Disposition: A | Discharge status: Home / Self Care | Payer: BC Managed Care – PPO | Source: Ambulatory Visit | Attending: Family Medicine | Admitting: Family Medicine

## 2019-07-24 DIAGNOSIS — Z09 Encounter for follow-up examination after completed treatment for conditions other than malignant neoplasm: Secondary | ICD-10-CM

## 2019-07-24 DIAGNOSIS — B948 Sequelae of other specified infectious and parasitic diseases: Secondary | ICD-10-CM | POA: Diagnosis not present

## 2019-07-24 DIAGNOSIS — H6991 Unspecified Eustachian tube disorder, right ear: Secondary | ICD-10-CM | POA: Diagnosis not present

## 2019-08-24 DIAGNOSIS — B948 Sequelae of other specified infectious and parasitic diseases: Secondary | ICD-10-CM | POA: Diagnosis not present

## 2019-08-24 DIAGNOSIS — R609 Edema, unspecified: Secondary | ICD-10-CM | POA: Diagnosis not present

## 2019-09-27 ENCOUNTER — Other Ambulatory Visit: Payer: Self-pay | Admitting: Family Medicine

## 2019-09-27 ENCOUNTER — Ambulatory Visit
Admission: RE | Admit: 2019-09-27 | Discharge: 2019-09-27 | Disposition: A | Discharge status: Home / Self Care | Payer: Managed Care, Other (non HMO) | Source: Ambulatory Visit | Attending: Family Medicine | Admitting: Family Medicine

## 2019-09-27 DIAGNOSIS — B948 Sequelae of other specified infectious and parasitic diseases: Secondary | ICD-10-CM | POA: Diagnosis not present

## 2019-09-27 DIAGNOSIS — M25531 Pain in right wrist: Secondary | ICD-10-CM | POA: Diagnosis not present

## 2020-05-18 IMAGING — CT CT ANGIO AOBIFEM WO/W CM
2 of 11 series · 12 of 46 positions shown, 15 images · IV contrast (omnipaque)
Comparison: None.

CLINICAL DATA: Leg pain. The patient was pinned between a forklift
and freight.

EXAM:
CT ANGIOGRAPHY OF ABDOMINAL AORTA WITH ILIOFEMORAL RUNOFF
TECHNIQUE: Multidetector CT imaging of the abdomen, pelvis and lower
extremities was performed using the standard protocol during bolus
administration of intravenous contrast. Multiplanar CT image
reconstructions and MIPs were obtained to evaluate the vascular
anatomy.
CONTRAST:  100mL OMNIPAQUE IOHEXOL 350 MG/ML SOLN

[Series 4: runoff axial arterial · axial · arterial · 0.98mm/px · z∈[+38,+1192]mm · 11 of 430 slices shown, 13 images]
[im 30/430  soft-tissue]
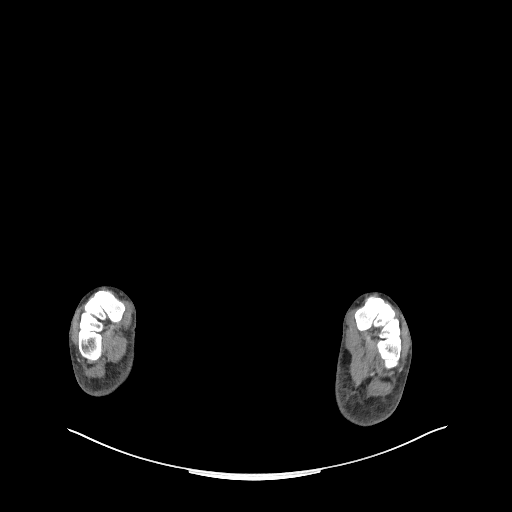
[im 30/430  bone]
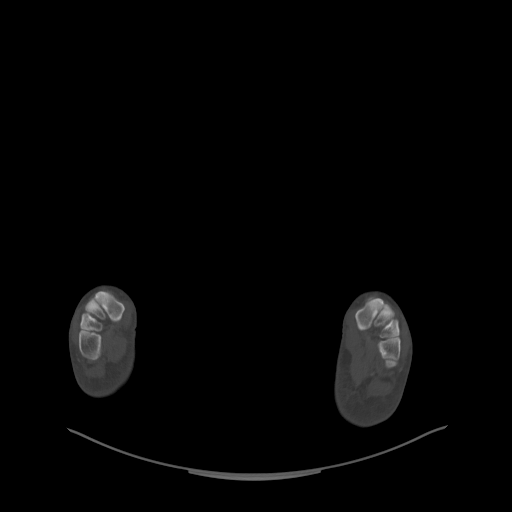
[im 89/430  soft-tissue]
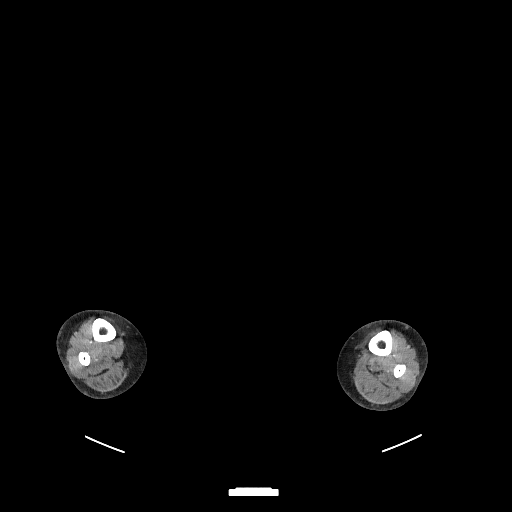
[im 134/430  soft-tissue]
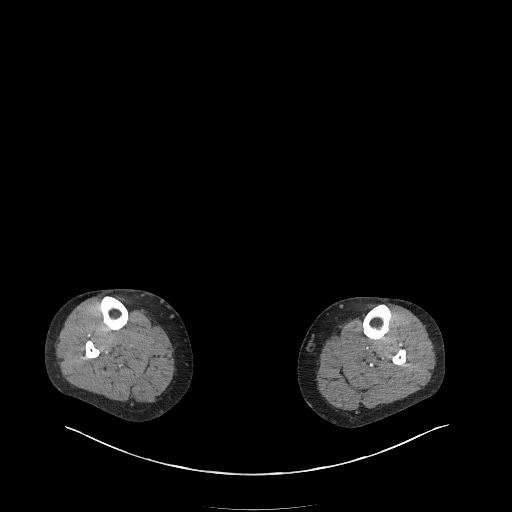
[im 193/430  soft-tissue]
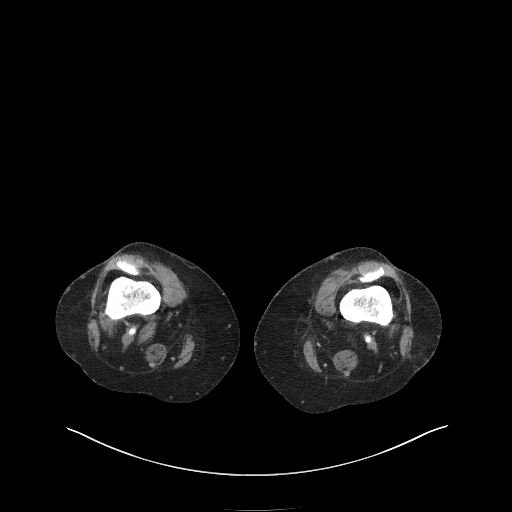
[im 237/430  soft-tissue]
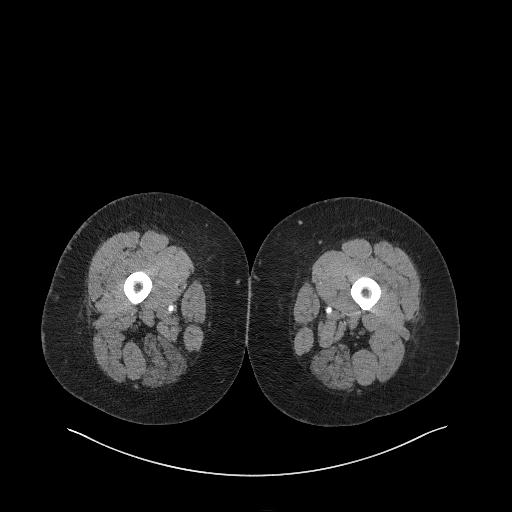
[im 296/430  soft-tissue]
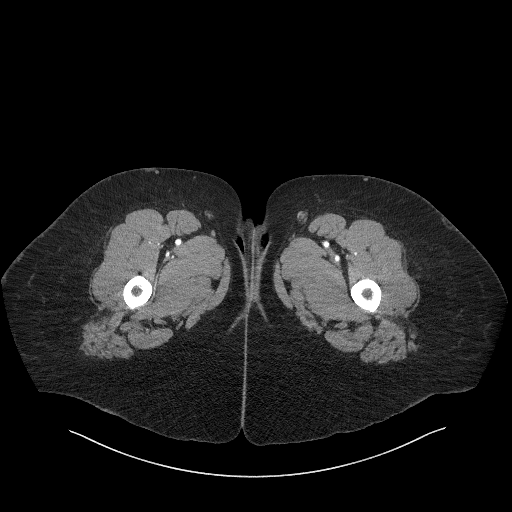
[im 341/430  soft-tissue]
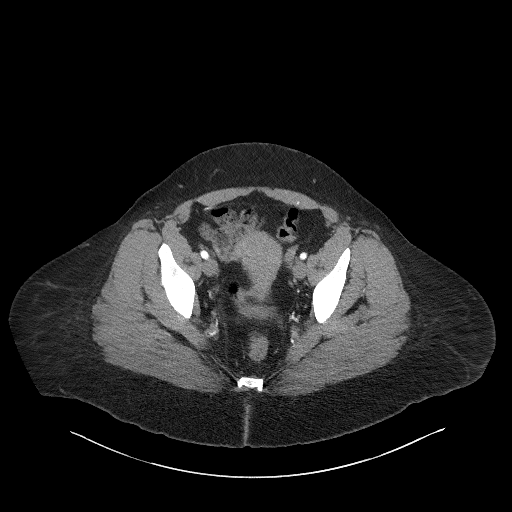
[im 370/430  lung]
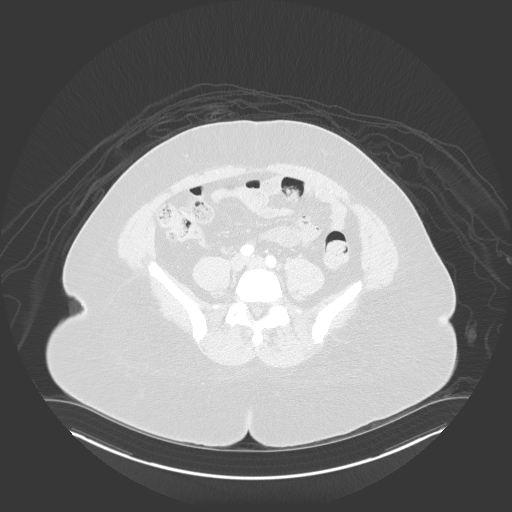
[im 385/430  lung]
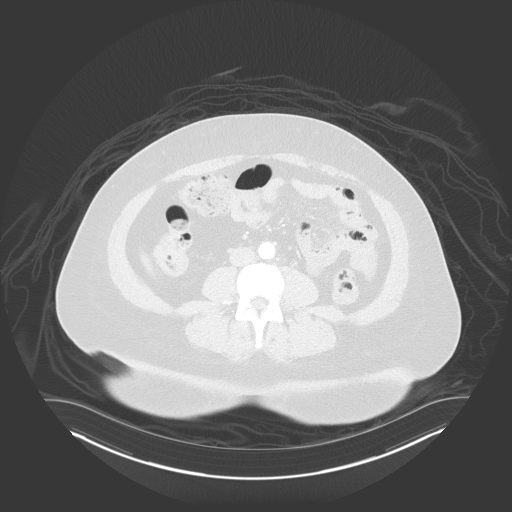
[im 400/430  soft-tissue]
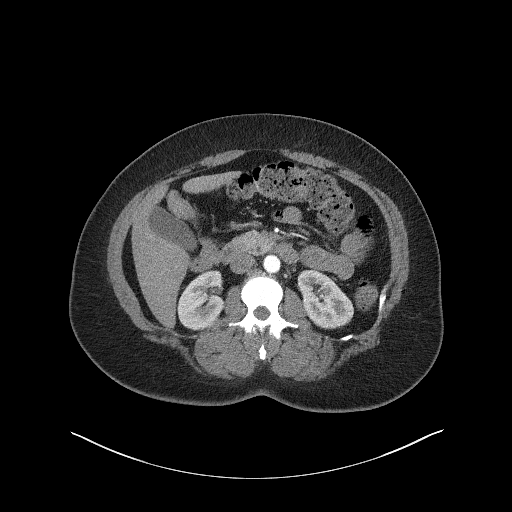
[im 400/430  lung]
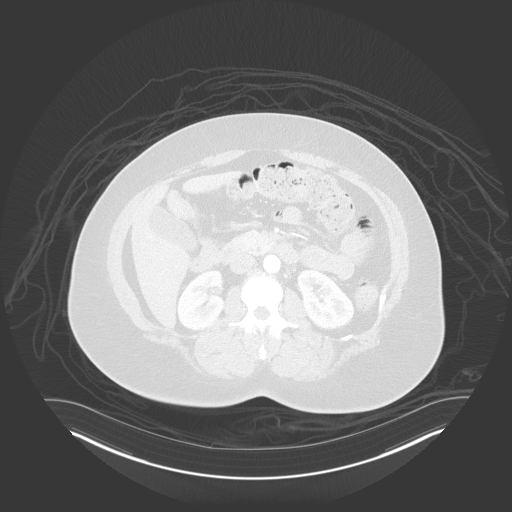
[im 415/430  lung]
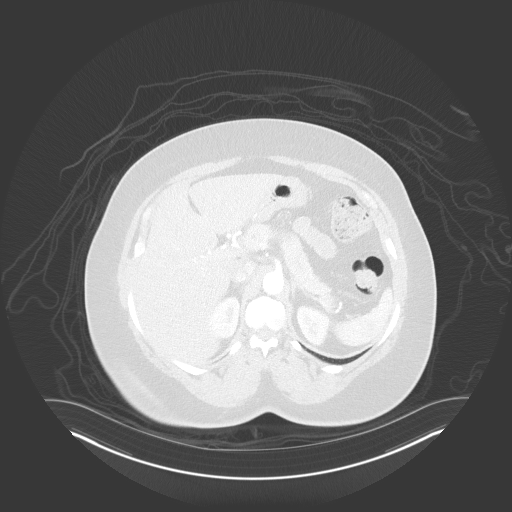

[Series 5: coronal upper · coronal · 0.97mm/px · 1 of 148 slices shown, 2 images]
[im 74/148  soft-tissue]
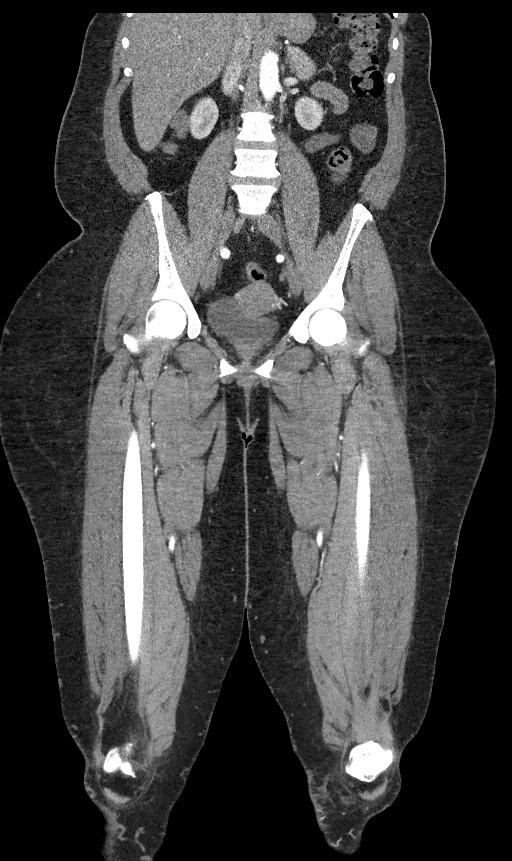
[im 74/148  bone]
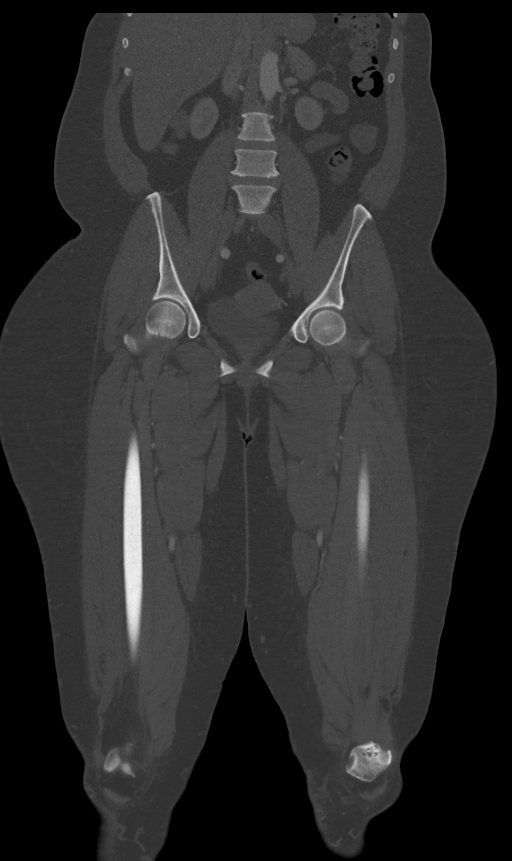

[12 of 46 positions shown; findings below may reference images not displayed]

FINDINGS: VASCULAR

Aorta: Normal caliber aorta without aneurysm, dissection, vasculitis
or significant stenosis.

Celiac: Patent without evidence of aneurysm, dissection, vasculitis
or significant stenosis.

SMA: Patent without evidence of aneurysm, dissection, vasculitis or
significant stenosis.

Renals: Both renal arteries are patent without evidence of aneurysm,
dissection, vasculitis, fibromuscular dysplasia or significant
stenosis.

IMA: Patent without evidence of aneurysm, dissection, vasculitis or
significant stenosis.

RIGHT Lower Extremity

Inflow: Common, internal and external iliac arteries are patent
without evidence of aneurysm, dissection, vasculitis or significant
stenosis.

Outflow: Common, superficial and profunda femoral arteries and the
popliteal artery are patent without evidence of aneurysm,
dissection, vasculitis or significant stenosis.

Runoff: There is an at least 2 vessel runoff to the level of the
ankle via the anterior tibial and peroneal arteries. The posterior
tibial artery appears to be occluded proximally. There are few
varicose veins noted.

LEFT Lower Extremity

Inflow: Common, internal and external iliac arteries are patent
without evidence of aneurysm, dissection, vasculitis or significant
stenosis.

Outflow: Common, superficial and profunda femoral arteries and the
popliteal artery are patent without evidence of aneurysm,
dissection, vasculitis or significant stenosis.

Runoff: Patent three vessel runoff to the ankle.

Veins: Bilateral varicose veins are noted.

Review of the MIP images confirms the above findings.

NON-VASCULAR

Lower chest: The lung bases are clear.The heart was not visualized
on this exam.

Hepatobiliary: The liver is normal. Normal gallbladder.There is no
biliary ductal dilation.

Pancreas: Normal contours without ductal dilatation. No
peripancreatic fluid collection.

Spleen: No splenic laceration or hematoma.

Adrenals/Urinary Tract:

--Adrenal glands: No adrenal hemorrhage.

--Right kidney/ureter: No hydronephrosis or perinephric hematoma.

--Left kidney/ureter: No hydronephrosis or perinephric hematoma.

--Urinary bladder: The bladder is decompressed which limits
evaluation.

Stomach/Bowel:

--Stomach/Duodenum: No hiatal hernia or other gastric abnormality.
Normal duodenal course and caliber.

--Small bowel: No dilatation or inflammation.

--Colon: There is a focal area of narrowing at the level of the
hepatic flexure favored to represent peristalsis.

--Appendix: Normal.

Vascular/Lymphatic: Normal course and caliber of the major abdominal
vessels.

--No retroperitoneal lymphadenopathy.

--No mesenteric lymphadenopathy.

--No pelvic or inguinal lymphadenopathy.

Reproductive: A NuvaRing is noted. There is a small fibroid
involving the anterior uterine fundus.

Other: No ascites or free air. There are bilateral fat containing
inguinal hernias.

Musculoskeletal. There is no acute displaced fracture or
dislocation. There is soft tissue swelling about the left knee.
There is no significant suprapatellar joint effusion involving
either knee.
IMPRESSION: VASCULAR

1. No acute vascular injury.
2. There is an at least 2 vessel runoff to the right ankle with
occlusion of the proximal right posterior tibial artery.
3. There is a 3 vessel runoff on the left.
4. Bilateral varicose veins are incidentally noted.

NON-VASCULAR

1. No acute displaced fracture.  No dislocation.
2. There is soft tissue swelling about the left knee. No joint
effusion bilaterally.
3. Fibroid uterus.

## 2020-05-20 ENCOUNTER — Other Ambulatory Visit (HOSPITAL_BASED_OUTPATIENT_CLINIC_OR_DEPARTMENT_OTHER): Payer: Self-pay

## 2020-05-20 ENCOUNTER — Other Ambulatory Visit: Payer: Self-pay

## 2020-05-20 ENCOUNTER — Ambulatory Visit: Payer: No Typology Code available for payment source | Attending: Internal Medicine

## 2020-05-20 DIAGNOSIS — Z23 Encounter for immunization: Secondary | ICD-10-CM

## 2020-05-20 MED ORDER — PFIZER-BIONT COVID-19 VAC-TRIS 30 MCG/0.3ML IM SUSP
INTRAMUSCULAR | 0 refills | Status: AC
  Filled 2020-05-20 – 2020-05-21 (×2): qty 0.3, 1d supply, fill #0

## 2020-05-20 NOTE — Progress Notes (Signed)
   Covid-19 Vaccination Clinic  Name:  Erica [name]    MRN: [id number] DOB: 1970/05/11  05/20/2020  [name] was observed post Covid-19 immunization for 15 minutes without incident. She was provided with Vaccine Information Sheet and instruction to access the V-Safe system.   [name] was instructed to call 911 with any severe reactions post vaccine: Marland Kitchen Difficulty breathing  . Swelling of face and throat  . A fast heartbeat  . A bad rash all over body  . Dizziness and weakness   Immunizations Administered    Name Date Dose VIS Date Route   PFIZER Comrnaty(Gray TOP) Covid-19 Vaccine 05/20/2020  9:40 AM 0.3 mL 12/13/2019 Intramuscular   Manufacturer: [employer]   Lot: [id number]   NDC: [phone]

## 2020-05-21 ENCOUNTER — Other Ambulatory Visit (HOSPITAL_BASED_OUTPATIENT_CLINIC_OR_DEPARTMENT_OTHER): Payer: Self-pay

## 2020-05-30 ENCOUNTER — Ambulatory Visit
Admission: RE | Admit: 2020-05-30 | Discharge: 2020-05-30 | Disposition: A | Discharge status: Home / Self Care | Payer: Managed Care, Other (non HMO) | Source: Ambulatory Visit | Attending: Family Medicine | Admitting: Family Medicine

## 2020-05-30 ENCOUNTER — Other Ambulatory Visit: Payer: Self-pay | Admitting: Family Medicine

## 2020-05-30 DIAGNOSIS — R140 Abdominal distension (gaseous): Secondary | ICD-10-CM

## 2020-06-12 ENCOUNTER — Other Ambulatory Visit: Payer: Self-pay | Admitting: Family Medicine

## 2020-06-12 DIAGNOSIS — R140 Abdominal distension (gaseous): Secondary | ICD-10-CM

## 2020-07-01 ENCOUNTER — Other Ambulatory Visit: Payer: No Typology Code available for payment source

## 2020-07-03 ENCOUNTER — Other Ambulatory Visit: Payer: Self-pay

## 2020-07-03 ENCOUNTER — Ambulatory Visit
Admission: RE | Admit: 2020-07-03 | Discharge: 2020-07-03 | Disposition: A | Discharge status: Home / Self Care | Payer: No Typology Code available for payment source | Source: Ambulatory Visit | Attending: Family Medicine | Admitting: Family Medicine

## 2020-07-03 DIAGNOSIS — R140 Abdominal distension (gaseous): Secondary | ICD-10-CM

## 2021-01-19 IMAGING — CR DG CHEST 2V
2 series · 2 of 2 positions shown · non-contrast
Comparison: July 24, 2019

CLINICAL DATA: Shortness of breath.  Previous 5XUEF-H3 positive

EXAM:
CHEST - 2 VIEW

[w chest pa]
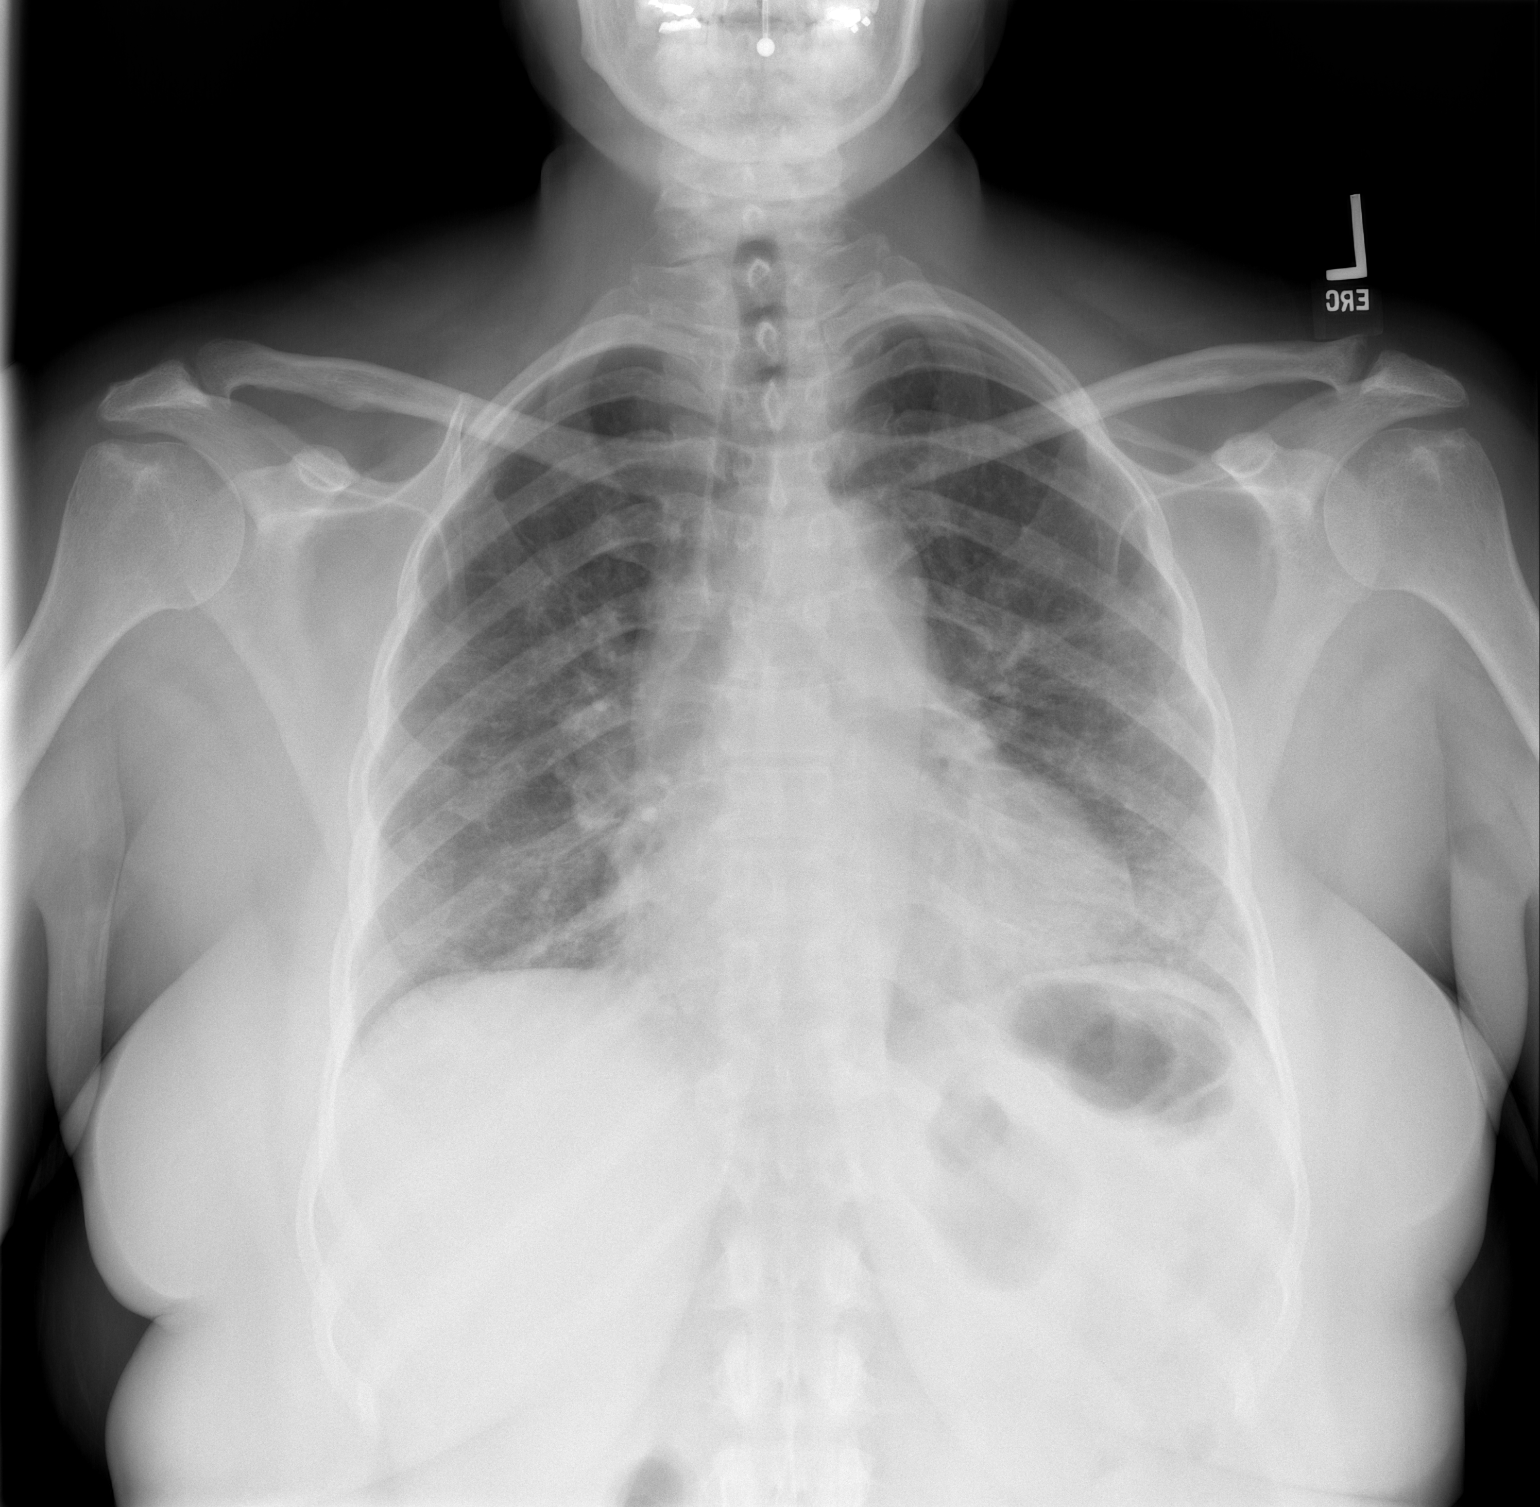

[w chest lat]
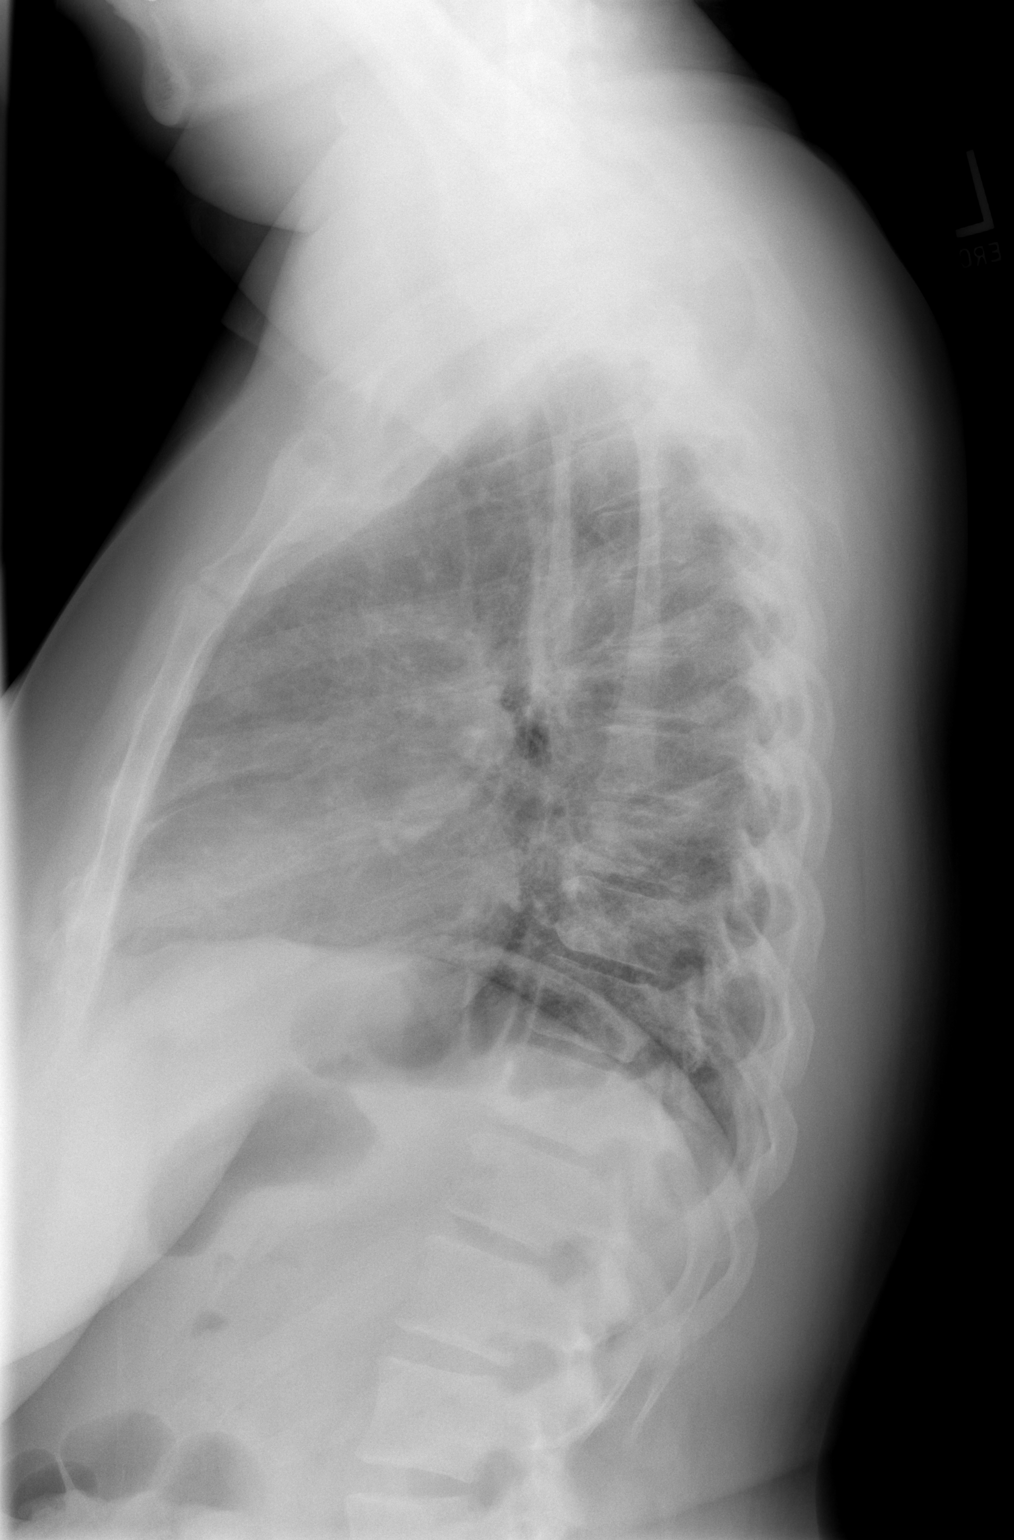

[2 of 2 positions shown; findings below may reference images not displayed]

FINDINGS: There are areas of patchy atelectasis in the left mid and lower lung
regions and to a lesser extent in the right mid and lower lung
regions. There has been slight further clearing some paired to the
prior study. No new opacity evident. Heart is borderline enlarged
with pulmonary vascularity normal. No adenopathy. No bone lesions.
IMPRESSION: Patchy residual atelectasis may represent residua from previous
COVID 19 pneumonitis. Slightly less opacity bilaterally compared to
2 months prior. No new opacity evident. Heart borderline enlarged.
No evident adenopathy.

## 2021-06-18 DIAGNOSIS — Z1231 Encounter for screening mammogram for malignant neoplasm of breast: Secondary | ICD-10-CM | POA: Diagnosis not present

## 2021-06-18 DIAGNOSIS — Z6841 Body Mass Index (BMI) 40.0 and over, adult: Secondary | ICD-10-CM | POA: Diagnosis not present

## 2021-06-18 DIAGNOSIS — Z01419 Encounter for gynecological examination (general) (routine) without abnormal findings: Secondary | ICD-10-CM | POA: Diagnosis not present

## 2021-06-18 DIAGNOSIS — I10 Essential (primary) hypertension: Secondary | ICD-10-CM | POA: Diagnosis not present

## 2021-06-18 DIAGNOSIS — Z1211 Encounter for screening for malignant neoplasm of colon: Secondary | ICD-10-CM | POA: Diagnosis not present

## 2021-06-25 DIAGNOSIS — Z1211 Encounter for screening for malignant neoplasm of colon: Secondary | ICD-10-CM | POA: Diagnosis not present

## 2021-06-25 DIAGNOSIS — K5904 Chronic idiopathic constipation: Secondary | ICD-10-CM | POA: Diagnosis not present

## 2021-06-25 DIAGNOSIS — E785 Hyperlipidemia, unspecified: Secondary | ICD-10-CM | POA: Diagnosis not present

## 2021-07-08 DIAGNOSIS — M1712 Unilateral primary osteoarthritis, left knee: Secondary | ICD-10-CM | POA: Diagnosis not present

## 2021-07-24 DIAGNOSIS — K6389 Other specified diseases of intestine: Secondary | ICD-10-CM | POA: Diagnosis not present

## 2021-07-24 DIAGNOSIS — K635 Polyp of colon: Secondary | ICD-10-CM | POA: Diagnosis not present

## 2021-07-24 DIAGNOSIS — K5140 Inflammatory polyps of colon without complications: Secondary | ICD-10-CM | POA: Diagnosis not present

## 2021-07-24 DIAGNOSIS — K648 Other hemorrhoids: Secondary | ICD-10-CM | POA: Diagnosis not present

## 2021-07-24 DIAGNOSIS — Z1211 Encounter for screening for malignant neoplasm of colon: Secondary | ICD-10-CM | POA: Diagnosis not present

## 2021-07-24 DIAGNOSIS — D123 Benign neoplasm of transverse colon: Secondary | ICD-10-CM | POA: Diagnosis not present

## 2021-08-12 ENCOUNTER — Encounter (HOSPITAL_COMMUNITY): Payer: Self-pay

## 2021-08-12 ENCOUNTER — Ambulatory Visit (HOSPITAL_COMMUNITY)
Admission: EM | Admit: 2021-08-12 | Discharge: 2021-08-12 | Disposition: A | Discharge status: Home / Self Care | Payer: 59 | Attending: Internal Medicine | Admitting: Internal Medicine

## 2021-08-12 DIAGNOSIS — N390 Urinary tract infection, site not specified: Secondary | ICD-10-CM | POA: Insufficient documentation

## 2021-08-12 LAB — POCT URINALYSIS DIPSTICK, ED / UC
Bilirubin Urine: NEGATIVE
Glucose, UA: NEGATIVE mg/dL
Ketones, ur: NEGATIVE mg/dL
Nitrite: NEGATIVE
Protein, ur: NEGATIVE mg/dL
Specific Gravity, Urine: <=1.005 (ref 1.005–1.030)
Urobilinogen, UA: 0.2 mg/dL (ref 0.0–1.0)
pH: 6 (ref 5.0–8.0)

## 2021-08-12 MED ORDER — CEPHALEXIN 500 MG PO CAPS: 500.0000 mg | ORAL_CAPSULE | Freq: Three times a day (TID) | ORAL | 0 refills | Status: AC

## 2021-08-12 MED ORDER — FLUCONAZOLE 150 MG PO TABS: 150.0000 mg | ORAL_TABLET | ORAL | 0 refills | Status: AC

## 2021-08-12 NOTE — Discharge Instructions (Signed)
Take Keflex antibiotic 3 times daily for the next 7 days to treat urinary tract infection.  We have sent your urine for culture and we will call you if we need to change her antibiotic.  Continue drinking plenty of water to flush out your kidneys and stay well-hydrated while recovering from urinary tract infection.  Return to urgent care as needed for any new or worsening symptoms.  I hope you feel better!

## 2021-08-12 NOTE — ED Triage Notes (Signed)
Pt presents with dysuria and hematuria that began yesterday.

## 2021-08-12 NOTE — ED Provider Notes (Signed)
[facility]    CSN: [id number] Arrival date & time: 08/12/21  1910      History   Chief Complaint Chief Complaint  Patient presents with   Dysuria    HPI Erica [name] is a 51 y.o. female.   Patient presents urgent care for evaluation of dysuria that started yesterday.  She also noticed increased urinary frequency and googled her symptoms and she found out that she may have a urinary tract infection.  Denies frequent urinary tract infections.  No recent antibiotic use.  No abdominal pain, nausea, vomiting, fever/chills, low back pain, flank pain, vaginal symptoms, or dizziness reported.  Last menstrual cycle was in April 2021 and patient is now considered to be postmenopausal.  Denies vaginal dryness.  She also noticed that her urine appeared to be slightly darker in color yesterday but this cleared up this morning for the most part.  She also noticed that her urine was more cloudy than usual over the last couple of days and may have seen some "specks of blood" in her urine.  States that she drinks approximately 1 gallon of water per day and denies drinking sodas, sweet tea, and other known urinary irritants.  No attempted use of any over-the-counter medications prior to arrival urgent care for symptoms.   Dysuria   Past Medical History:  Diagnosis Date   BV (bacterial vaginosis) 2001   COVID-19    H/O gonorrhea 1995   History of elevated lipids 03/18/2008   HSV-2 infection 1995    Patient Active Problem List   Diagnosis Date Noted   MSSA bacteremia 05/07/2019   COVID-19 virus infection 04/28/2019   Possible exposure to STD 04/29/2011   HSV-2 infection 03/31/2011    History reviewed. No pertinent surgical history.  OB History   No obstetric history on file.      Home Medications    Prior to Admission medications   Medication Sig Start Date End Date Taking? Authorizing Provider  cephALEXin (KEFLEX) 500 MG capsule Take 1 capsule (500 mg total) by mouth 3  (three) times daily for 7 days. 08/12/21 08/19/21 Yes Talbot Grumbling, FNP  fluconazole (DIFLUCAN) 150 MG tablet Take 1 tablet (150 mg total) by mouth every 7 (seven) days. 08/12/21  Yes Talbot Grumbling, FNP  furosemide (LASIX) 20 MG tablet Take 20 mg by mouth.   Yes [provider]  acetaminophen (TYLENOL) 500 MG tablet Take 500 mg by mouth every 6 (six) hours as needed for fever or headache (pain). Patient not taking: Reported on 08/12/2021    [provider]  apixaban (ELIQUIS) 2.5 MG TABS tablet Take 1 tablet (2.5 mg total) by mouth 2 (two) times daily. Patient not taking: Reported on 08/12/2021 05/04/19   Thurnell Lose, MD  benzonatate (TESSALON) 200 MG capsule Take 1 capsule (200 mg total) by mouth 3 (three) times daily as needed for cough. Patient not taking: Reported on 08/12/2021 05/14/19   Elgergawy, Silver Huguenin, MD  chlorpheniramine-HYDROcodone (TUSSIONEX) 10-8 MG/5ML SUER Take 2.5-5 mLs by mouth every 12 (twelve) hours as needed for cough.  Patient not taking: Reported on 08/12/2021 04/27/19   [provider]  COVID-19 mRNA Vac-TriS, Pfizer, (PFIZER-BIONT COVID-19 VAC-TRIS) SUSP injection Inject into the muscle. 05/20/20   Carlyle Basques, MD  guaiFENesin-dextromethorphan (ROBITUSSIN DM) 100-10 MG/5ML syrup Take 10 mLs by mouth every 4 (four) hours as needed for cough. Patient not taking: Reported on 08/12/2021 05/14/19   Elgergawy, Silver Huguenin, MD  MAGNESIUM PO Take  1 tablet by mouth daily. Patient not taking: Reported on 08/12/2021    [provider]  Multiple Vitamin (MULITIVITAMIN WITH MINERALS) TABS Take 1 tablet by mouth daily. Patient not taking: Reported on 08/12/2021    [provider]  valACYclovir (VALTREX) 500 MG tablet Take 1 tablet (500 mg total) by mouth 2 (two) times daily. Patient taking differently: Take 500 mg by mouth See admin instructions. Take one tablet (500 mg) by mouth twice daily for one week - as needed for breakouts 04/29/11    Haygood, Seymour Bars, MD    Family History Family History  Problem Relation Age of Onset   Hypertension Maternal Grandmother    Hypertension Maternal Grandfather    Asthma Cousin     Social History Social History   Tobacco Use   Smoking status: Never   Smokeless tobacco: Never  Vaping Use   Vaping Use: Never used  Substance Use Topics   Alcohol use: Yes    Comment: socially   Drug use: No     Allergies   Patient has no known allergies.   Review of Systems Review of Systems  Genitourinary:  Positive for dysuria.  Per HPI   Physical Exam Triage Vital Signs ED Triage Vitals  Enc Vitals Group     BP 08/12/21 1930 (!) 150/94     Pulse Rate 08/12/21 1930 74     Resp 08/12/21 1930 14     Temp 08/12/21 1930 98.3 F (36.8 C)     Temp Source 08/12/21 1930 Oral     SpO2 08/12/21 1930 100 %     Weight --      Height --      Head Circumference --      Peak Flow --      Pain Score 08/12/21 1928 0     Pain Loc --      Pain Edu? --      Excl. in [place]? --    No data found.  Updated Vital Signs BP (!) 150/94 (BP Location: Left Arm)   Pulse 74   Temp 98.3 F (36.8 C) (Oral)   Resp 14   LMP 04/06/2019   SpO2 100%   Visual Acuity Right Eye Distance:   Left Eye Distance:   Bilateral Distance:    Right Eye Near:   Left Eye Near:    Bilateral Near:     Physical Exam Vitals and nursing note reviewed.  Constitutional:      Appearance: Normal appearance. She is not ill-appearing or toxic-appearing.     Comments: Very pleasant patient sitting on exam in position of comfort table in no acute distress.   HENT:     Head: Normocephalic and atraumatic.     Right Ear: External ear normal.     Left Ear: External ear normal.     Nose: Nose normal.     Mouth/Throat:     Lips: Pink.     Mouth: Mucous membranes are moist.  Eyes:     General: Lids are normal. Vision grossly intact. Gaze aligned appropriately.     Extraocular Movements: Extraocular movements intact.      Conjunctiva/sclera: Conjunctivae normal.  Pulmonary:     Effort: Pulmonary effort is normal.  Abdominal:     Palpations: Abdomen is soft.  Musculoskeletal:     Cervical back: Neck supple.  Skin:    General: Skin is warm and dry.     Capillary Refill: Capillary refill takes less than 2 seconds.  Findings: No rash.  Neurological:     General: No focal deficit present.     Mental Status: She is alert and oriented to person, place, and time. Mental status is at baseline.     Cranial Nerves: No dysarthria or facial asymmetry.     Gait: Gait is intact.  Psychiatric:        Mood and Affect: Mood normal.        Speech: Speech normal.        Behavior: Behavior normal.        Thought Content: Thought content normal.        Judgment: Judgment normal.      UC Treatments / Results  Labs (all labs ordered are listed, but only abnormal results are displayed)   EKG   Radiology No results found.  Procedures Procedures (including critical care time)  Medications Ordered in UC Medications - No data to display  Initial Impression / Assessment and Plan / UC Course  I have reviewed the triage vital signs and the nursing notes.  Pertinent labs & imaging results that were available during my care of the patient were reviewed by me and considered in my medical decision making (see chart for details).    Urinalysis shows small leukocytes and moderate hemoglobin consistent with uncomplicated urinary tract infection due to symptoms. We will manage this with keflex 3 times daily for the next 7 days. Urine culture pending. Will call to change antibiotic if necessary based on urine culture. Encouraged increased water intake and avoidance of urinary irritants.   Discussed physical exam and available lab work findings in clinic with patient.  Counseled patient regarding appropriate use of medications and potential side effects for all medications recommended or prescribed today. Discussed red  flag signs and symptoms of worsening condition,when to call the PCP office, return to urgent care, and when to seek higher level of care in the emergency department. Patient verbalizes understanding and agreement with plan. All questions answered. Patient discharged in stable condition.  Final Clinical Impressions(s) / UC Diagnoses   Final diagnoses:  Lower urinary tract infectious disease     Discharge Instructions      Take Keflex antibiotic 3 times daily for the next 7 days to treat urinary tract infection.  We have sent your urine for culture and we will call you if we need to change her antibiotic.  Continue drinking plenty of water to flush out your kidneys and stay well-hydrated while recovering from urinary tract infection.  Return to urgent care as needed for any new or worsening symptoms.  I hope you feel better!     ED Prescriptions     Medication Sig Dispense Auth. Provider   cephALEXin (KEFLEX) 500 MG capsule Take 1 capsule (500 mg total) by mouth 3 (three) times daily for 7 days. 21 capsule Joella Prince M, FNP   fluconazole (DIFLUCAN) 150 MG tablet Take 1 tablet (150 mg total) by mouth every 7 (seven) days. 2 tablet Talbot Grumbling, FNP      PDMP not reviewed this encounter.   Talbot Grumbling, [place] 08/15/21 1929

## 2021-08-15 LAB — URINE CULTURE

## 2021-09-22 IMAGING — CR DG ABDOMEN 2V
2 series · 2 of 2 positions shown · non-contrast
Comparison: None

CLINICAL DATA: Abdominal bloating for 1 month, no pain

EXAM:
ABDOMEN - 2 VIEW

[w abdomen upright]
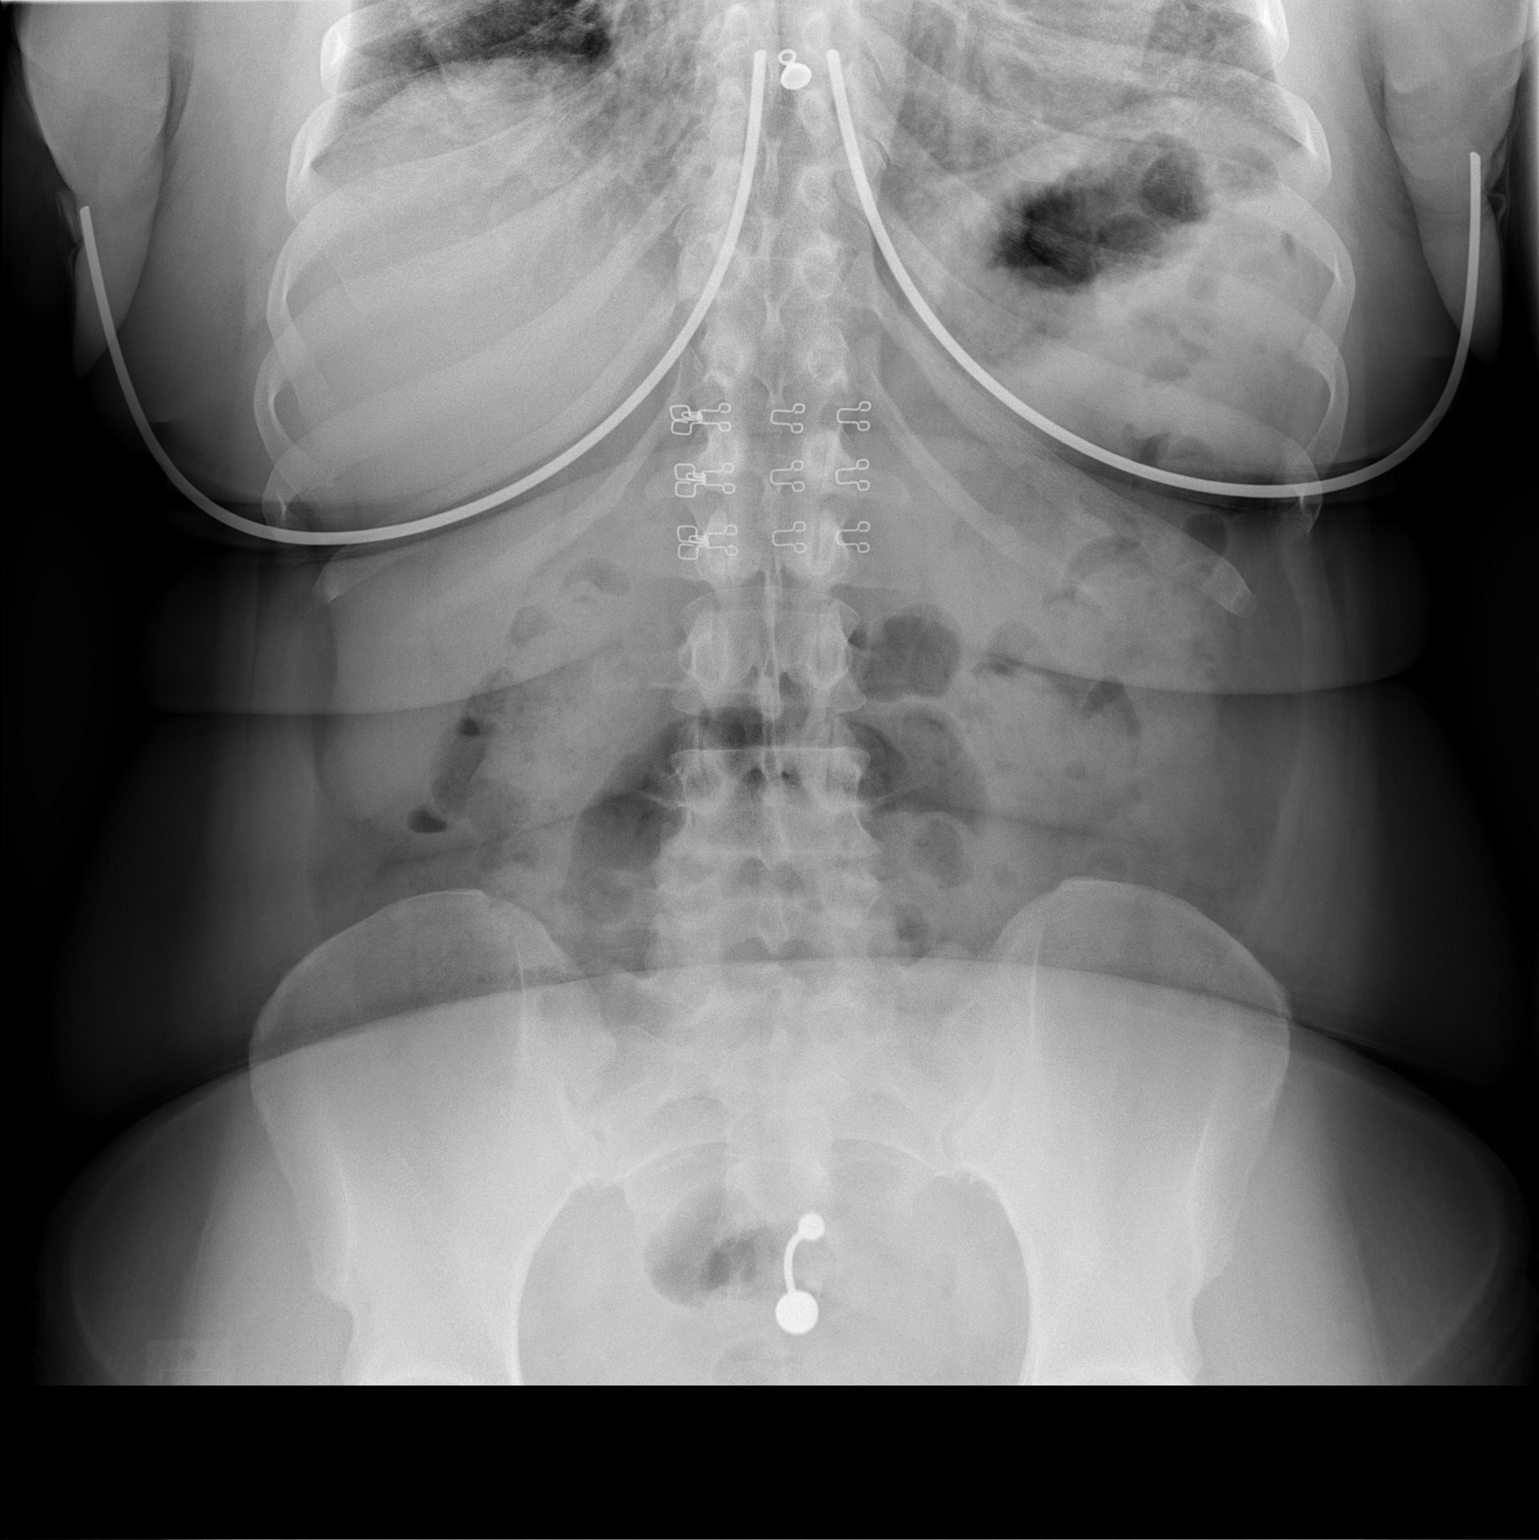

[t abdomen supine]
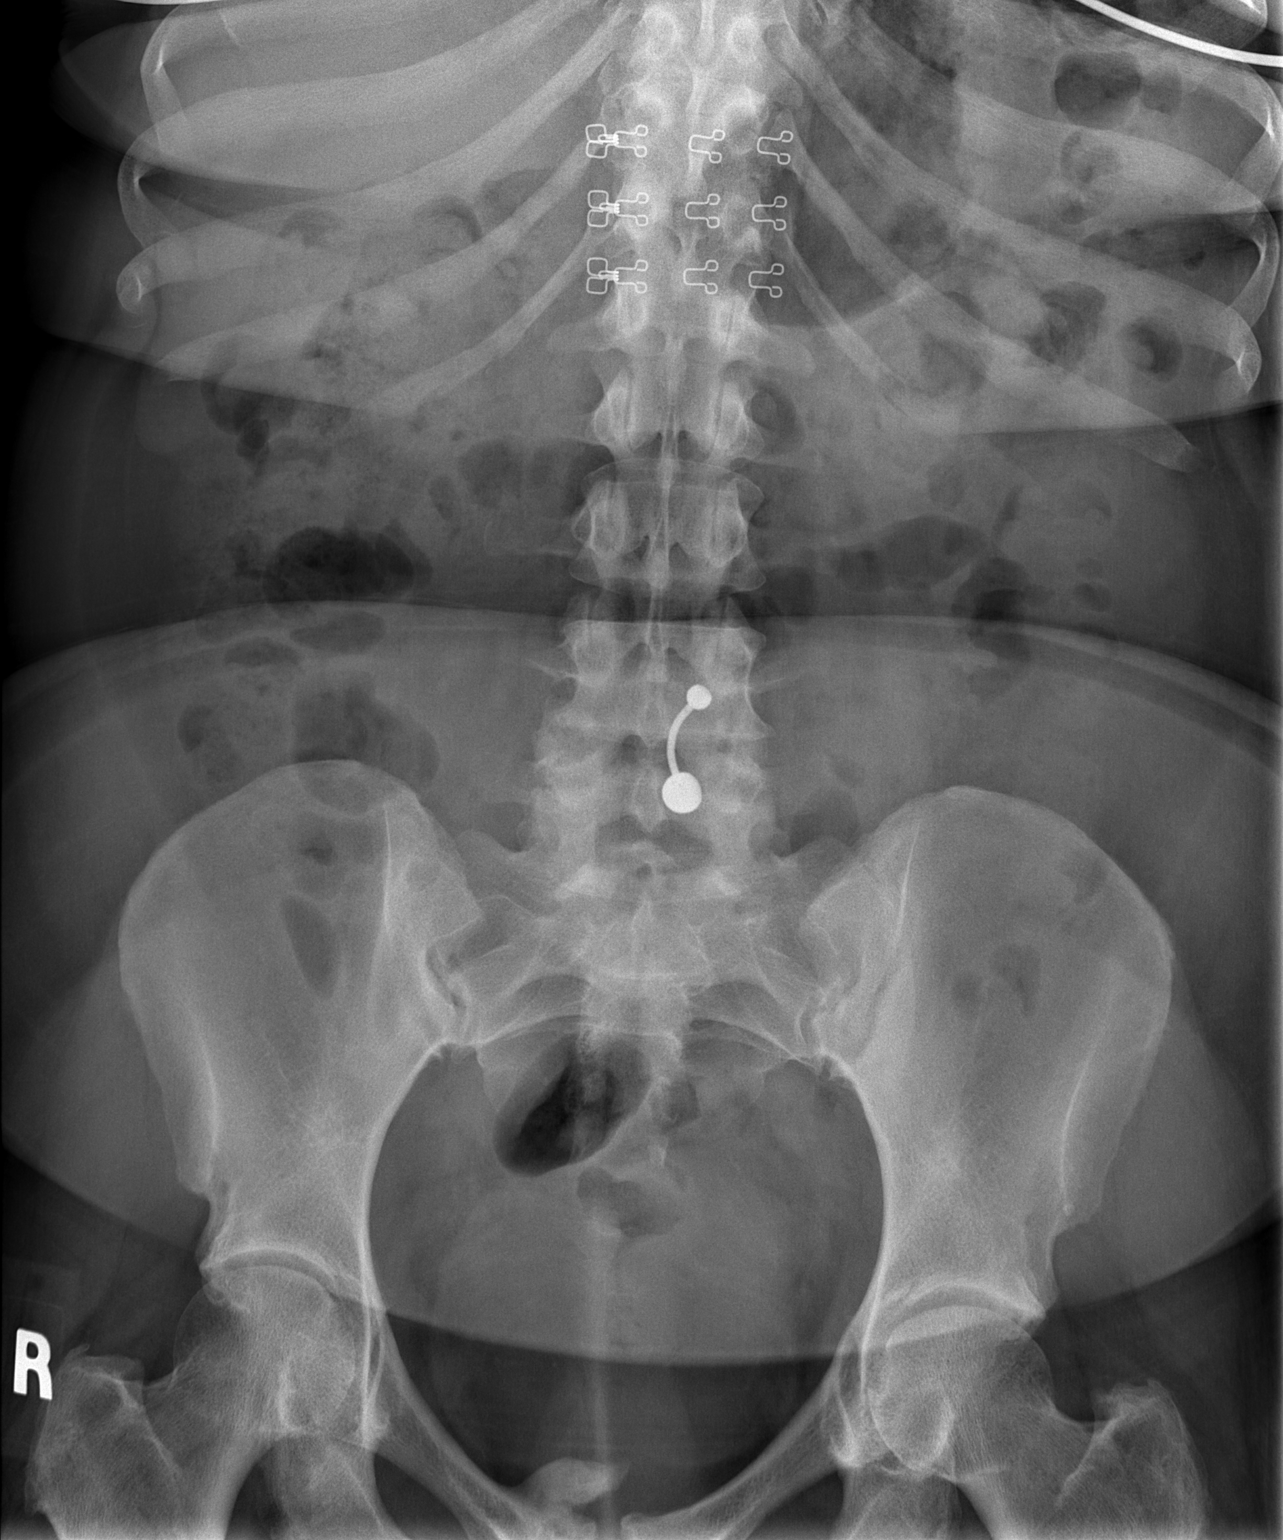

[2 of 2 positions shown; findings below may reference images not displayed]

FINDINGS: Scattered normal stool burden throughout colon.

Nonobstructive bowel gas pattern.

No bowel dilatation, bowel wall thickening, or free air.

Jewelry artifact projects over L4-L5 on supine image.

Bony excrescence seen from the cranial margin of the RIGHT pubis,
question sequela of remote trauma.

No urinary tract calcification.
IMPRESSION: No acute abnormalities.

## 2021-12-31 ENCOUNTER — Ambulatory Visit
Admission: EM | Admit: 2021-12-31 | Discharge: 2021-12-31 | Disposition: A | Discharge status: Home / Self Care | Payer: 59 | Attending: Urgent Care | Admitting: Urgent Care

## 2021-12-31 DIAGNOSIS — B9789 Other viral agents as the cause of diseases classified elsewhere: Secondary | ICD-10-CM | POA: Diagnosis not present

## 2021-12-31 DIAGNOSIS — J329 Chronic sinusitis, unspecified: Secondary | ICD-10-CM

## 2021-12-31 MED ORDER — PROMETHAZINE-DM 6.25-15 MG/5ML PO SYRP: 2.5000 mL | ORAL_SOLUTION | Freq: Three times a day (TID) | ORAL | 0 refills | Status: AC | PRN

## 2021-12-31 MED ORDER — PSEUDOEPHEDRINE HCL 30 MG PO TABS: 30.0000 mg | ORAL_TABLET | Freq: Three times a day (TID) | ORAL | 0 refills | Status: AC | PRN

## 2021-12-31 MED ORDER — IPRATROPIUM BROMIDE 0.03 % NA SOLN: 2.0000 | Freq: Two times a day (BID) | NASAL | 0 refills | Status: AC

## 2021-12-31 MED ORDER — CETIRIZINE HCL 10 MG PO TABS: 10.0000 mg | ORAL_TABLET | Freq: Every day | ORAL | 0 refills | Status: AC

## 2021-12-31 NOTE — ED Provider Notes (Signed)
[facility]  Note:  This document was prepared using Systems analyst and may include unintentional dictation errors.  MRN: [id number] DOB: 05/08/1970  Subjective:   Erica [name] is a 51 y.o. female presenting for 4 day history of sinus congestion, sinus pressure, scratchy throat, ear discomfort bilaterally, mild productive cough that is now resolved. No fever, chest pain, shob, wheezing, body aches. No smoking, marijuana, vaping. No asthma, allergies. Usually gets a sinus infection same time each year.   No current facility-administered medications for this encounter.  Current Outpatient Medications:    acetaminophen (TYLENOL) 500 MG tablet, Take 500 mg by mouth every 6 (six) hours as needed for fever or headache (pain). (Patient not taking: Reported on 08/12/2021), Disp: , Rfl:    apixaban (ELIQUIS) 2.5 MG TABS tablet, Take 1 tablet (2.5 mg total) by mouth 2 (two) times daily. (Patient not taking: Reported on 08/12/2021), Disp: 60 tablet, Rfl: 0   benzonatate (TESSALON) 200 MG capsule, Take 1 capsule (200 mg total) by mouth 3 (three) times daily as needed for cough. (Patient not taking: Reported on 08/12/2021), Disp: 20 capsule, Rfl: 0   chlorpheniramine-HYDROcodone (TUSSIONEX) 10-8 MG/5ML SUER, Take 2.5-5 mLs by mouth every 12 (twelve) hours as needed for cough.  (Patient not taking: Reported on 08/12/2021), Disp: , Rfl:    COVID-19 mRNA Vac-TriS, Pfizer, (PFIZER-BIONT COVID-19 VAC-TRIS) SUSP injection, Inject into the muscle., Disp: 0.3 mL, Rfl: 0   fluconazole (DIFLUCAN) 150 MG tablet, Take 1 tablet (150 mg total) by mouth every 7 (seven) days., Disp: 2 tablet, Rfl: 0   furosemide (LASIX) 20 MG tablet, Take 20 mg by mouth., Disp: , Rfl:    guaiFENesin-dextromethorphan (ROBITUSSIN DM) 100-10 MG/5ML syrup, Take 10 mLs by mouth every 4 (four) hours as needed for cough. (Patient not taking: Reported on 08/12/2021), Disp: 118 mL, Rfl: 0   MAGNESIUM PO, Take 1  tablet by mouth daily. (Patient not taking: Reported on 08/12/2021), Disp: , Rfl:    Multiple Vitamin (MULITIVITAMIN WITH MINERALS) TABS, Take 1 tablet by mouth daily. (Patient not taking: Reported on 08/12/2021), Disp: , Rfl:    valACYclovir (VALTREX) 500 MG tablet, Take 1 tablet (500 mg total) by mouth 2 (two) times daily. (Patient taking differently: Take 500 mg by mouth See admin instructions. Take one tablet (500 mg) by mouth twice daily for one week - as needed for breakouts), Disp: 30 tablet, Rfl: 12   No Known Allergies  Past Medical History:  Diagnosis Date   BV (bacterial vaginosis) 2001   COVID-19    H/O gonorrhea 1995   History of elevated lipids 03/18/2008   HSV-2 infection 1995     History reviewed. No pertinent surgical history.  Family History  Problem Relation Age of Onset   Hypertension Maternal Grandmother    Hypertension Maternal Grandfather    Asthma Cousin     Social History   Tobacco Use   Smoking status: Never   Smokeless tobacco: Never  Vaping Use   Vaping Use: Never used  Substance Use Topics   Alcohol use: Yes    Comment: socially   Drug use: No    ROS   Objective:   Vitals: BP (!) 143/83 (BP Location: Left Arm)   Pulse 72   Temp 98.8 F (37.1 C) (Oral)   Resp 16   LMP 04/06/2019   SpO2 96%   Physical Exam Constitutional:      General: She is not in acute distress.  Appearance: Normal appearance. She is well-developed and normal weight. She is not ill-appearing, toxic-appearing or diaphoretic.  HENT:     Head: Normocephalic and atraumatic.     Right Ear: Tympanic membrane, ear canal and external ear normal. No drainage or tenderness. No middle ear effusion. There is no impacted cerumen. Tympanic membrane is not erythematous or bulging.     Left Ear: Tympanic membrane, ear canal and external ear normal. No drainage or tenderness.  No middle ear effusion. There is no impacted cerumen. Tympanic membrane is not erythematous or bulging.      Nose: Congestion present. No rhinorrhea.     Mouth/Throat:     Mouth: Mucous membranes are moist. No oral lesions.     Pharynx: No pharyngeal swelling, oropharyngeal exudate, posterior oropharyngeal erythema or uvula swelling.     Tonsils: No tonsillar exudate or tonsillar abscesses.  Eyes:     General: No scleral icterus.       Right eye: No discharge.        Left eye: No discharge.     Extraocular Movements: Extraocular movements intact.     Right eye: Normal extraocular motion.     Left eye: Normal extraocular motion.     Conjunctiva/sclera: Conjunctivae normal.  Cardiovascular:     Rate and Rhythm: Normal rate and regular rhythm.     Heart sounds: Normal heart sounds. No murmur heard.    No friction rub. No gallop.  Pulmonary:     Effort: Pulmonary effort is normal. No respiratory distress.     Breath sounds: No stridor. No wheezing, rhonchi or rales.  Chest:     Chest wall: No tenderness.  Musculoskeletal:     Cervical back: Normal range of motion and neck supple.  Lymphadenopathy:     Cervical: No cervical adenopathy.  Skin:    General: Skin is warm and dry.  Neurological:     General: No focal deficit present.     Mental Status: She is alert and oriented to person, place, and time.  Psychiatric:        Mood and Affect: Mood normal.        Behavior: Behavior normal.     Assessment and Plan :   PDMP not reviewed this encounter.  1. Viral sinusitis     Does not want COVID testing. Suspect viral URI, viral sinusitis. Physical exam findings reassuring and vital signs stable for discharge. Advised supportive care, offered symptomatic relief. Deferred imaging given clear cardiopulmonary exam, hemodynamically stable vital signs. Counseled patient on potential for adverse effects with medications prescribed/recommended today, ER and return-to-clinic precautions discussed, patient verbalized understanding.     Jaynee Eagles, PA-C 12/31/21 1729

## 2021-12-31 NOTE — ED Triage Notes (Signed)
Pt c/o sinus congestion/pressure x 4 days-NAD-steady gait

## 2022-01-21 DIAGNOSIS — E7800 Pure hypercholesterolemia, unspecified: Secondary | ICD-10-CM | POA: Diagnosis not present

## 2022-01-21 DIAGNOSIS — R7303 Prediabetes: Secondary | ICD-10-CM | POA: Diagnosis not present

## 2022-01-21 DIAGNOSIS — Z0000 Encounter for general adult medical examination without abnormal findings: Secondary | ICD-10-CM | POA: Diagnosis not present

## 2022-01-21 DIAGNOSIS — E669 Obesity, unspecified: Secondary | ICD-10-CM | POA: Diagnosis not present

## 2022-01-21 DIAGNOSIS — J309 Allergic rhinitis, unspecified: Secondary | ICD-10-CM | POA: Diagnosis not present

## 2022-01-21 DIAGNOSIS — R609 Edema, unspecified: Secondary | ICD-10-CM | POA: Diagnosis not present

## 2022-02-19 DIAGNOSIS — M25571 Pain in right ankle and joints of right foot: Secondary | ICD-10-CM | POA: Diagnosis not present

## 2022-02-26 DIAGNOSIS — S93401A Sprain of unspecified ligament of right ankle, initial encounter: Secondary | ICD-10-CM | POA: Diagnosis not present

## 2022-02-26 DIAGNOSIS — S8261XA Displaced fracture of lateral malleolus of right fibula, initial encounter for closed fracture: Secondary | ICD-10-CM | POA: Diagnosis not present

## 2022-02-26 DIAGNOSIS — M25571 Pain in right ankle and joints of right foot: Secondary | ICD-10-CM | POA: Diagnosis not present

## 2022-03-02 DIAGNOSIS — M25561 Pain in right knee: Secondary | ICD-10-CM | POA: Diagnosis not present

## 2022-03-02 DIAGNOSIS — M1712 Unilateral primary osteoarthritis, left knee: Secondary | ICD-10-CM | POA: Diagnosis not present

## 2022-03-19 DIAGNOSIS — S93401D Sprain of unspecified ligament of right ankle, subsequent encounter: Secondary | ICD-10-CM | POA: Diagnosis not present

## 2022-03-19 DIAGNOSIS — M25571 Pain in right ankle and joints of right foot: Secondary | ICD-10-CM | POA: Diagnosis not present

## 2022-05-07 ENCOUNTER — Encounter: Payer: 59 | Attending: Family Medicine | Admitting: Dietician

## 2022-05-07 ENCOUNTER — Encounter: Payer: Self-pay | Admitting: Dietician

## 2022-05-07 VITALS — Ht 65.0 in | Wt 256.0 lb

## 2022-05-07 DIAGNOSIS — Z713 Dietary counseling and surveillance: Secondary | ICD-10-CM | POA: Insufficient documentation

## 2022-05-07 NOTE — Progress Notes (Signed)
Medical Nutrition Therapy  Appointment Start time:  62  Appointment End time:  1147  Primary concerns today: Pt states she is concerned that her weight has continued to increase.   Referral diagnosis: employee visit, no referral diagnosis Preferred learning style: no preference indicated Learning readiness: ready   NUTRITION ASSESSMENT   Anthropometrics  Ht: 65 in Wt: 256 lbs  Clinical Medical Hx: reviewed Medications: reviewed Labs: reviewed Notable Signs/Symptoms: none reported Food Allergies: none reported  Lifestyle & Dietary Hx Pt is here for her first employee visit for [employer].  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 Her hours are 7-3:30pm.    Pt states she cooks at home during week and usually goes out to eat on the weekend.  Pt states she has to have a knee replacement but is not sure when this will take place. She states she can still do a variety of exercise.   Pt states she used to do zumba 1-2 times per week and was going to the gym for a while. She states she didn't like how many people were at the gym so she stopped going.   Pt reports they have a gym at work so sometimes on her lunch break she goes to do 10-15 minutes of exercise.   Estimated daily fluid intake: 64 oz Supplements: MVI, magnesium Sleep: 6 hours Stress / self-care: moderate stress Current average weekly physical activity: Walking 15 min 5x/wk.   24-Hr Dietary Recall First Meal: bagel and reduced fat cream cheese with fruit OR smoothie (fruit, 1/2 avocado, almond milk or water, spinach, flax/chia, protein) OR yogurt and granola OR boiled eggs and fruit Snack: none Second Meal: 1pm: vegetables with chicken  Snack: none OR fruit OR peanut butter and crackers Third Meal: chicken or fish with vegetables with occasionally potato Snack: none OR popcorn Beverages: water, green tea   NUTRITION DIAGNOSIS  NB-1.1 Food and nutrition-related knowledge deficit As related to lack  of prior nutrition education by a nutrition professional.  As evidenced by pt report and diet history.   NUTRITION INTERVENTION  Nutrition education (E-1) on the following topics:  Educated pt on importance of consistent meal times and avoiding skipping meals, including discussion on risks of intermittent fasting MyPlate education and strategies to increase more complex carbohydrates such as whole grains or starches into pts lunch and dinner Glucose needs of the brain, importance of carbohydrates in the diet Vitamin D deficiency signs/symptoms, vitamin D synthesis in the body, vitamin D sources in food Physical activity goals and benefits on health Importance of adequate sleep on health and preventing chronic disease  Handouts Provided Include  MyPlate Meal Ideas  Learning Style & Readiness for Change Teaching method utilized: Visual & Auditory  Demonstrated degree of understanding via: Teach Back  Barriers to learning/adherence to lifestyle change: none  Goals Established by Pt Goal: aim to include a complex carbohydrate with lunch and dinner. Goal: Start going to zumba on Monday and Tuesday.  Goal: Wed-Fri exercise for 30 minutes (work gym, walking, etc.)   MONITORING & EVALUATION Dietary intake, weekly physical activity, and follow up in 2 months.  Next Steps  Patient is to call for questions.

## 2022-05-07 NOTE — Patient Instructions (Signed)
Goal: aim to include a complex carbohydrate with lunch and dinner. Goal: Start going to zumba on Monday and Tuesday.  Goal: Wed-Fri exercise for 30 minutes (work gym, walking, etc.)

## 2022-05-20 DIAGNOSIS — S8261XD Displaced fracture of lateral malleolus of right fibula, subsequent encounter for closed fracture with routine healing: Secondary | ICD-10-CM | POA: Diagnosis not present

## 2022-05-20 DIAGNOSIS — S93401D Sprain of unspecified ligament of right ankle, subsequent encounter: Secondary | ICD-10-CM | POA: Diagnosis not present

## 2022-06-29 DIAGNOSIS — B009 Herpesviral infection, unspecified: Secondary | ICD-10-CM | POA: Diagnosis not present

## 2022-06-29 DIAGNOSIS — Z1211 Encounter for screening for malignant neoplasm of colon: Secondary | ICD-10-CM | POA: Diagnosis not present

## 2022-06-29 DIAGNOSIS — Z3040 Encounter for surveillance of contraceptives, unspecified: Secondary | ICD-10-CM | POA: Diagnosis not present

## 2022-06-29 DIAGNOSIS — Z1231 Encounter for screening mammogram for malignant neoplasm of breast: Secondary | ICD-10-CM | POA: Diagnosis not present

## 2022-06-29 DIAGNOSIS — Z01419 Encounter for gynecological examination (general) (routine) without abnormal findings: Secondary | ICD-10-CM | POA: Diagnosis not present

## 2022-06-29 DIAGNOSIS — Z139 Encounter for screening, unspecified: Secondary | ICD-10-CM | POA: Diagnosis not present

## 2022-06-29 DIAGNOSIS — N760 Acute vaginitis: Secondary | ICD-10-CM | POA: Diagnosis not present

## 2022-06-29 DIAGNOSIS — Z6841 Body Mass Index (BMI) 40.0 and over, adult: Secondary | ICD-10-CM | POA: Diagnosis not present

## 2022-06-29 DIAGNOSIS — N898 Other specified noninflammatory disorders of vagina: Secondary | ICD-10-CM | POA: Diagnosis not present

## 2022-07-13 ENCOUNTER — Encounter: Payer: Self-pay | Admitting: Dietician

## 2022-07-13 ENCOUNTER — Encounter: Payer: 59 | Attending: Family Medicine | Admitting: Dietician

## 2022-07-13 DIAGNOSIS — Z713 Dietary counseling and surveillance: Secondary | ICD-10-CM | POA: Insufficient documentation

## 2022-07-13 NOTE — Progress Notes (Signed)
Medical Nutrition Therapy  Appointment Start time:  [phone]  Appointment End time:  1700  Primary concerns: Pt states she is concerned that her weight has continued to increase.   Referral diagnosis: employee visit 2 of 2, no referral diagnosis Preferred learning style: no preference indicated Learning readiness: ready   NUTRITION ASSESSMENT   Anthropometrics  Ht: 65 in Wt 07/13/22: 253 lbs Wt 05/07/22: 256 lbs  Clinical Medical Hx: reviewed Medications: reviewed Labs: reviewed Notable Signs/Symptoms: none reported Food Allergies: none reported  Lifestyle & Dietary Hx  Pt states she hasn't noticed her weight dropping but she noticed changes in her body, including feeling like there is less fat around her 'love handle' area.  Pt states she has been trying to walk on incline for 15-30 min on the treadmill at work. She states she hasn't been going to zumba because a few of her coworkers got covid there.   Pt states she has started including a carb with lunch and dinner. At lunch she will have a fruit or sweet potatoes, and with dinner she usually has a baked potato.   Estimated daily fluid intake: 64 oz Supplements: MVI, magnesium Sleep: 6 hours Stress / self-care: moderate stress Current average weekly physical activity: Walking 15-30 min 3-5x/wk.   24-Hr Dietary Recall First Meal: smoothie (fruit, 1/2 avocado, almond milk or water, spinach, flax/chia, protein) OR 2 boiled eggs and oatmeal and bacon Snack: apple and pack of crackers Second Meal: 1pm: vegetables with chicken and sweet potato OR salad and chicken and fruit Snack: none OR fruit OR peanut butter and crackers Third Meal: salad with chicken and baked potato Snack: none OR popcorn Beverages: water, green tea   NUTRITION DIAGNOSIS  NB-1.1 Food and nutrition-related knowledge deficit As related to lack of prior nutrition education by a nutrition professional.  As evidenced by pt report and diet  history.   NUTRITION INTERVENTION  Nutrition education (E-1) on the following topics:  The brain requires a continuous supply of glucose, as it is its primary energy source. On average, the brain consumes about 20% of the body's total glucose-derived energy, which is critical for maintaining cognitive functions, memory, and overall brain health. A consistent supply of glucose helps sustain mental performance and focus. Complex carbohydrates are beneficial as they break down slowly, providing a steady release of glucose into the bloodstream, which helps maintain stable energy levels and cognitive functions throughout the day. Examples of Complex Carbohydrates: Whole Grains: Brown rice, Quinoa, Oats, Barley, Whole wheat products (bread, pasta). Legumes: Lentils, Chickpeas, Black beans, Kidney beans, Peas. Starchy Vegetables: Sweet potatoes, potatoes, corn, peas. Fruits. Adequate sleep is essential for maintaining overall health and well-being. It plays a critical role in cognitive functions, while also regulating emotions and managing stress. Physically, sleep supports muscle repair, tissue growth, and a robust immune system, reducing the risk of infections and illnesses. It is vital for metabolic health, helping regulate appetite and prevent weight gain, and it contributes to cardiovascular health by maintaining blood pressure and reducing the risk of heart disease. Sleep also influences hormonal balance, enhancing growth, stress regulation, and appetite control. To promote better sleep, it is important to maintain a regular sleep schedule, create a comfortable sleep environment, and engage in relaxing activities before bedtime.  Handouts Provided Include  No handouts provided on this follow up   Learning Style & Readiness for Change Teaching method utilized: Visual & Auditory  Demonstrated degree of understanding via: Teach Back  Barriers to learning/adherence to lifestyle change:  none  Goals  Established by Pt  Continue all goals:  Goal: aim to include a complex carbohydrate with lunch and dinner. - pt states she started adding sweet potatoes with lunch and baked potato at dinner.   Goal: Start going to zumba on Monday and Tuesday.  - pt states she went a couple of times but slacked off a little because some of the people she worked with got covid.   Goal: Wed-Fri exercise for 30 minutes (work gym, walking, etc.) - goal met, continue. Pt states she has walking on incline.  New Goal:  Start meal prepping more often.   MONITORING & EVALUATION Dietary intake, weekly physical activity, and follow up PRN  Next Steps  Patient is to call for questions.

## 2022-08-05 ENCOUNTER — Other Ambulatory Visit (HOSPITAL_BASED_OUTPATIENT_CLINIC_OR_DEPARTMENT_OTHER): Payer: Self-pay

## 2022-08-05 MED ORDER — VALACYCLOVIR HCL 500 MG PO TABS
500.0000 mg | ORAL_TABLET | Freq: Every day | ORAL | 4 refills | Status: DC
  Filled 2022-08-05 – 2022-11-15 (×3): qty 90, 90d supply, fill #0
  Filled 2023-02-07: qty 90, 90d supply, fill #1
  Filled 2023-05-02: qty 90, 90d supply, fill #2

## 2022-08-17 DIAGNOSIS — H1131 Conjunctival hemorrhage, right eye: Secondary | ICD-10-CM | POA: Diagnosis not present

## 2022-08-24 ENCOUNTER — Ambulatory Visit: Payer: 59 | Admitting: Dietician

## 2022-09-01 ENCOUNTER — Encounter: Payer: Self-pay | Admitting: Dietician

## 2022-09-01 ENCOUNTER — Encounter: Payer: 59 | Attending: Internal Medicine | Admitting: Dietician

## 2022-09-01 DIAGNOSIS — Z713 Dietary counseling and surveillance: Secondary | ICD-10-CM | POA: Insufficient documentation

## 2022-09-01 NOTE — Progress Notes (Signed)
Medical Nutrition Therapy  Appointment Start time:  1525  Appointment End time:  1545  Primary concerns: Pt states she is concerned that her weight has continued to increase.   Referral diagnosis: employee visit 3 of 3, no referral diagnosis Preferred learning style: no preference indicated Learning readiness: ready   NUTRITION ASSESSMENT   Anthropometrics  Ht: 65 in Wt 09/01/22: 254.5 lbs Wt 07/13/22: 253 lbs Wt 05/07/22: 256 lbs  Clinical Medical Hx: reviewed Medications: reviewed Labs: reviewed Notable Signs/Symptoms: none reported Food Allergies: none reported  Lifestyle & Dietary Hx  Pt states she has been trying to get more sleep. She states she has been sleeping 6-7 hours.   Pt states she has been doing zumba 1x/wk. Pt states she has been inconsistent with walking and feels like she needs to dedicate a time to it.   Pt states she has been trying to do meal prepping more often.   Estimated daily fluid intake: 80+ oz Supplements: MVI, magnesium Sleep: 6 hours Stress / self-care: moderate stress Current average weekly physical activity: Walking occasionally, zumba 1x/wk.   24-Hr Dietary Recall First Meal: 2 boiled eggs and pineapple Snack: none Second Meal: 1pm: watermelon and potato wedges Snack: none OR fruit OR peanut butter and crackers Third Meal: salad with chicken and baked potato OR out to eat [place] Snack: none OR popcorn Beverages: water, green tea   NUTRITION DIAGNOSIS  NB-1.1 Food and nutrition-related knowledge deficit As related to lack of prior nutrition education by a nutrition professional.  As evidenced by pt report and diet history.   NUTRITION INTERVENTION  Nutrition education (E-1) on the following topics:  Fruits & Vegetables: Aim to fill half your plate with a variety of fruits and vegetables. They are rich in vitamins, minerals, and fiber, and can help reduce the risk of chronic diseases. Choose a colorful assortment of fruits and  vegetables to ensure you get a wide range of nutrients. Grains and Starches: Make at least half of your grain choices whole grains, such as brown rice, whole wheat bread, and oats. Whole grains provide fiber, which aids in digestion and healthy cholesterol levels. Aim for whole forms of starchy vegetables such as potatoes, sweet potatoes, beans, peas, and corn, which are fiber rich and provide many vitamins and minerals.  Protein: Incorporate lean sources of protein, such as poultry, fish, beans, nuts, and seeds, into your meals. Protein is essential for building and repairing tissues, staying full, balancing blood sugar, as well as supporting immune function. Dairy: Include low-fat or fat-free dairy products like milk, yogurt, and cheese in your diet. Dairy foods are excellent sources of calcium and vitamin D, which are crucial for bone health.  Physical Activity: Aim for 60 minutes of physical activity daily. Regular physical activity promotes overall health-including helping to reduce risk for heart disease and diabetes, promoting mental health, and helping [place] sleep better.   Handouts Provided Include  Plate Method  Learning Style & Readiness for Change Teaching method utilized: Visual & Auditory  Demonstrated degree of understanding via: Teach Back  Barriers to learning/adherence to lifestyle change: none  Goals Established by Pt  New Goal:  Walk around the driveway for 20-30 minutes 3-4 evenings each week.   Continue all previous goals:  Goal: aim to include a complex carbohydrate with lunch and dinner. - pt states she started adding sweet potatoes with lunch and baked potato at dinner.   Goal: Start going to zumba on Monday and Tuesday.  - pt states  she went a couple of times but slacked off a little because some of the people she worked with got covid.   Goal: Wed-Fri exercise for 30 minutes (work gym, walking, etc.) - goal met, continue. Pt states she has walking on  incline.  Start meal prepping more often.   MONITORING & EVALUATION Dietary intake, weekly physical activity, and follow up PRN  Next Steps  Patient is to call for questions.

## 2022-10-11 ENCOUNTER — Other Ambulatory Visit: Payer: Self-pay

## 2022-11-15 ENCOUNTER — Other Ambulatory Visit: Payer: Self-pay

## 2022-11-15 ENCOUNTER — Other Ambulatory Visit (HOSPITAL_COMMUNITY): Payer: Self-pay

## 2023-01-25 ENCOUNTER — Other Ambulatory Visit: Payer: Self-pay

## 2023-01-25 DIAGNOSIS — J309 Allergic rhinitis, unspecified: Secondary | ICD-10-CM | POA: Diagnosis not present

## 2023-01-25 DIAGNOSIS — R7303 Prediabetes: Secondary | ICD-10-CM | POA: Diagnosis not present

## 2023-01-25 DIAGNOSIS — K5909 Other constipation: Secondary | ICD-10-CM | POA: Diagnosis not present

## 2023-01-25 DIAGNOSIS — R0600 Dyspnea, unspecified: Secondary | ICD-10-CM | POA: Diagnosis not present

## 2023-01-25 DIAGNOSIS — R609 Edema, unspecified: Secondary | ICD-10-CM | POA: Diagnosis not present

## 2023-01-25 DIAGNOSIS — U099 Post covid-19 condition, unspecified: Secondary | ICD-10-CM | POA: Diagnosis not present

## 2023-01-25 DIAGNOSIS — Z0000 Encounter for general adult medical examination without abnormal findings: Secondary | ICD-10-CM | POA: Diagnosis not present

## 2023-01-25 DIAGNOSIS — E7800 Pure hypercholesterolemia, unspecified: Secondary | ICD-10-CM | POA: Diagnosis not present

## 2023-01-25 DIAGNOSIS — A6000 Herpesviral infection of urogenital system, unspecified: Secondary | ICD-10-CM | POA: Diagnosis not present

## 2023-01-25 MED ORDER — LINZESS 145 MCG PO CAPS
145.0000 ug | ORAL_CAPSULE | ORAL | 0 refills | Status: AC
  Filled 2023-01-25 – 2023-01-31 (×2): qty 30, 30d supply, fill #0

## 2023-01-25 MED ORDER — ALBUTEROL SULFATE HFA 108 (90 BASE) MCG/ACT IN AERS
1.0000 | INHALATION_SPRAY | RESPIRATORY_TRACT | 1 refills | Status: AC | PRN
  Filled 2023-01-25: qty 18, 34d supply, fill #0

## 2023-01-26 ENCOUNTER — Other Ambulatory Visit: Payer: Self-pay

## 2023-01-28 ENCOUNTER — Other Ambulatory Visit: Payer: Self-pay

## 2023-01-31 ENCOUNTER — Other Ambulatory Visit: Payer: Self-pay

## 2023-02-07 ENCOUNTER — Other Ambulatory Visit: Payer: Self-pay

## 2023-04-08 ENCOUNTER — Other Ambulatory Visit: Payer: Self-pay

## 2023-05-02 ENCOUNTER — Other Ambulatory Visit: Payer: Self-pay

## 2023-05-16 ENCOUNTER — Other Ambulatory Visit: Payer: Self-pay | Admitting: Obstetrics and Gynecology

## 2023-05-16 DIAGNOSIS — Z1231 Encounter for screening mammogram for malignant neoplasm of breast: Secondary | ICD-10-CM

## 2023-05-26 ENCOUNTER — Other Ambulatory Visit: Payer: Self-pay

## 2023-06-15 ENCOUNTER — Ambulatory Visit: Admitting: Dietician

## 2023-06-22 ENCOUNTER — Ambulatory Visit: Admitting: Dietician

## 2023-06-29 ENCOUNTER — Encounter: Payer: Self-pay | Admitting: Dietician

## 2023-06-29 ENCOUNTER — Encounter: Attending: Internal Medicine | Admitting: Dietician

## 2023-06-29 VITALS — Wt 246.9 lb

## 2023-06-29 DIAGNOSIS — Z713 Dietary counseling and surveillance: Secondary | ICD-10-CM | POA: Insufficient documentation

## 2023-06-29 NOTE — Progress Notes (Signed)
 Medical Nutrition Therapy  Appointment Start time:  1640  Appointment End time:  1700  Referral diagnosis: employee visit 1, no referral diagnosis Preferred learning style: no preference indicated Learning readiness: ready   NUTRITION ASSESSMENT   Anthropometrics  Ht: 65 in Wt 06/29/23: 246.9 lbs Wt 09/01/22: 254.5 lbs Wt 07/13/22: 253 lbs Wt 05/07/22: 256 lbs  Clinical Medical Hx: reviewed Medications: reviewed Labs: reviewed Notable Signs/Symptoms: none reported Food Allergies: none reported  Lifestyle & Dietary Hx  Pt is here today to continuing following up from employee visits last year.   Pt reports she has been trying to learn about insulin  resistance following being diagnosed with prediabetes and would like to better understand what that means.   Pt states she has been fairly consistent with eating habits, typically eating 2-3 times per day. Pt reports sometimes she does not get hungry.   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  As evidenced by pt report and diet history.   NUTRITION INTERVENTION  Nutrition education (E-1) on the following topics:   Prediabetes Prediabetes: Prediabetes is a condition where blood sugar  levels are higher than normal but not yet high enough to be diagnosed as type 2 diabetes. A1C, or hemoglobin A1c, is a blood test that provides an average of a person's blood sugar levels over the past two to three months. It is commonly used to diagnose and monitor diabetes. For prediabetes, an A1C level between 5.7% and 6.4% typically is used to diagnose this. Here is how the A1C levels are generally categorized: Normal:  A1C below 5.7% Prediabetes:  A1C between 5.7% and 6.4% Diabetes:  A1C of 6.5% or higher When diagnosed with prediabetes, there are several lifestyle changes you can make to manage the condition: Healthy Eating:  Follow a well-balanced diet that includes a variety of fruits, vegetables, whole grains, lean proteins, and healthy fats. Monitor portion sizes and reduce intake of sugary and processed foods. Regular Physical Activity:  Engage in regular physical activity, such as brisk walking, cycling, or other aerobic exercises, for at least 150 minutes per week. Include strength training exercises at least twice a week. Weight Management: Achieve and maintain a healthy weight. Losing even a small amount of weight (3-5%) can significantly improve insulin  sensitivity.  Plate Method Fruits & Vegetables: Aim to fill half your plate with a variety of fruits and vegetables. They are rich in vitamins, minerals, and fiber, and can help reduce the risk of chronic diseases. Choose a colorful assortment of fruits and vegetables to ensure you get a wide range of nutrients. Grains and Starches: Make at least half of your grain choices whole grains, such as brown rice, whole wheat bread, and oats. Whole grains provide fiber, which aids in digestion and  healthy cholesterol levels. Aim for whole forms of starchy vegetables such as potatoes, sweet potatoes, beans, peas, and corn, which are fiber rich and provide many vitamins and minerals.  Protein: Incorporate lean sources of protein, such as  poultry, fish, beans, nuts, and seeds, into your meals. Protein is essential for building and repairing tissues, staying full, balancing blood sugar, as well as supporting immune function. Dairy: Include low-fat or fat-free dairy products like milk, yogurt, and cheese in your diet. Dairy foods are excellent sources of calcium and vitamin D, which are crucial for bone health.   Handouts Provided Include  Plate Method  Learning Style & Readiness for Change Teaching method utilized: Visual & Auditory  Demonstrated degree of understanding via: Teach Back  Barriers to learning/adherence to lifestyle change: none  Goals Established by Pt  Goal 1: go walking for 15-30 minutes at least 4 nights per week.    MONITORING & EVALUATION Dietary intake, weekly physical activity, and follow up 4-8 weeks.  Next Steps  Patient is to call for questions.

## 2023-06-30 ENCOUNTER — Ambulatory Visit
Admission: RE | Admit: 2023-06-30 | Discharge: 2023-06-30 | Disposition: A | Discharge status: Home / Self Care | Source: Ambulatory Visit | Attending: Obstetrics and Gynecology | Admitting: Obstetrics and Gynecology

## 2023-06-30 ENCOUNTER — Ambulatory Visit

## 2023-06-30 DIAGNOSIS — Z1231 Encounter for screening mammogram for malignant neoplasm of breast: Secondary | ICD-10-CM | POA: Diagnosis not present

## 2023-07-05 ENCOUNTER — Other Ambulatory Visit: Payer: Self-pay | Admitting: Obstetrics and Gynecology

## 2023-07-05 ENCOUNTER — Other Ambulatory Visit (HOSPITAL_COMMUNITY): Payer: Self-pay

## 2023-07-05 DIAGNOSIS — R928 Other abnormal and inconclusive findings on diagnostic imaging of breast: Secondary | ICD-10-CM

## 2023-07-05 DIAGNOSIS — N898 Other specified noninflammatory disorders of vagina: Secondary | ICD-10-CM | POA: Diagnosis not present

## 2023-07-05 DIAGNOSIS — N904 Leukoplakia of vulva: Secondary | ICD-10-CM | POA: Diagnosis not present

## 2023-07-05 DIAGNOSIS — Z124 Encounter for screening for malignant neoplasm of cervix: Secondary | ICD-10-CM | POA: Diagnosis not present

## 2023-07-05 DIAGNOSIS — Z6841 Body Mass Index (BMI) 40.0 and over, adult: Secondary | ICD-10-CM | POA: Diagnosis not present

## 2023-07-05 DIAGNOSIS — B009 Herpesviral infection, unspecified: Secondary | ICD-10-CM | POA: Diagnosis not present

## 2023-07-05 DIAGNOSIS — Z1211 Encounter for screening for malignant neoplasm of colon: Secondary | ICD-10-CM | POA: Diagnosis not present

## 2023-07-05 DIAGNOSIS — Z01411 Encounter for gynecological examination (general) (routine) with abnormal findings: Secondary | ICD-10-CM | POA: Diagnosis not present

## 2023-07-05 DIAGNOSIS — Z3040 Encounter for surveillance of contraceptives, unspecified: Secondary | ICD-10-CM | POA: Diagnosis not present

## 2023-07-05 DIAGNOSIS — Z1330 Encounter for screening examination for mental health and behavioral disorders, unspecified: Secondary | ICD-10-CM | POA: Diagnosis not present

## 2023-07-05 MED ORDER — VALACYCLOVIR HCL 500 MG PO TABS
500.0000 mg | ORAL_TABLET | Freq: Every day | ORAL | 4 refills | Status: AC
  Filled 2023-08-05: qty 90, 90d supply, fill #0

## 2023-07-11 DIAGNOSIS — N904 Leukoplakia of vulva: Secondary | ICD-10-CM | POA: Diagnosis not present

## 2023-07-13 ENCOUNTER — Ambulatory Visit
Admission: RE | Admit: 2023-07-13 | Discharge: 2023-07-13 | Disposition: A | Discharge status: Home / Self Care | Source: Ambulatory Visit | Attending: Obstetrics and Gynecology | Admitting: Obstetrics and Gynecology

## 2023-07-13 DIAGNOSIS — R921 Mammographic calcification found on diagnostic imaging of breast: Secondary | ICD-10-CM | POA: Diagnosis not present

## 2023-07-13 DIAGNOSIS — R928 Other abnormal and inconclusive findings on diagnostic imaging of breast: Secondary | ICD-10-CM

## 2023-07-15 ENCOUNTER — Other Ambulatory Visit: Payer: Self-pay | Admitting: Obstetrics and Gynecology

## 2023-07-15 ENCOUNTER — Encounter

## 2023-07-15 DIAGNOSIS — R921 Mammographic calcification found on diagnostic imaging of breast: Secondary | ICD-10-CM

## 2023-07-21 ENCOUNTER — Other Ambulatory Visit (HOSPITAL_COMMUNITY): Payer: Self-pay

## 2023-07-21 ENCOUNTER — Other Ambulatory Visit: Payer: Self-pay

## 2023-07-21 MED ORDER — CLOBETASOL PROPIONATE 0.05 % EX OINT
1.0000 | TOPICAL_OINTMENT | Freq: Every day | CUTANEOUS | 4 refills | Status: AC
  Filled 2023-07-21: qty 45, 90d supply, fill #0
  Filled 2023-10-12: qty 45, 30d supply, fill #1
  Filled 2023-10-13: qty 45, 45d supply, fill #1

## 2023-07-26 ENCOUNTER — Encounter: Payer: Self-pay | Admitting: Dietician

## 2023-07-26 ENCOUNTER — Encounter: Attending: Internal Medicine | Admitting: Dietician

## 2023-07-26 VITALS — Wt 248.0 lb

## 2023-07-26 DIAGNOSIS — Z713 Dietary counseling and surveillance: Secondary | ICD-10-CM | POA: Insufficient documentation

## 2023-07-26 NOTE — Progress Notes (Signed)
 Medical Nutrition Therapy  Appointment Start time:  1620  Appointment End time:  1650  Referral diagnosis: employee visit 2, no referral diagnosis Preferred learning style: no preference indicated Learning readiness: ready   NUTRITION ASSESSMENT   Anthropometrics  Ht: 65 in Wt 07/26/23: 248 lbs Wt 06/29/23: 246.9 lbs  Weight History 2024 Wt 09/01/22: 254.5 lbs Wt 07/13/22: 253 lbs Wt 05/07/22: 256 lbs  Clinical Medical Hx: reviewed Medications: reviewed Labs: reviewed Notable Signs/Symptoms: none reported Food Allergies: none reported  Lifestyle & Dietary Hx  Pt reports she went on a motorcycle trip to [place] over the weekend. Pt reports they went out to eat at [employer], [employer], and [employer]. Pt reports at [employer] and [employer] she got salads.   Pt states she has been doing more walking in the evenings.   Pt reports she finds she has not been enjoying chicken as much. Pt reports she usually only has chicken and fish, but is considering having more meatless meals.   Pt states she saw a jar salad recipe she wants to try. Pt reports she went to a [facility] cooking class at [employer] and made a pasta dish with chickpea pasta which she enjoyed.   Estimated daily fluid intake: 100+ oz Supplements: MVI, magnesium Sleep: 11pm-5:30am Stress / self-care: low stress Current average weekly physical activity: Walking 15-30 minutes   24-Hr Dietary Recall First Meal: 3 boiled eggs and fruit Snack: none Second Meal: 1pm: watermelon OR leftovers Snack: none  Third Meal: baked chicken and cauliflower rice and broccoli OR spaghetti  Snack: none  Beverages: water   NUTRITION DIAGNOSIS  NB-1.1 Food and nutrition-related knowledge deficit As related to lack of prior nutrition education by a nutrition professional.  As evidenced by pt report and diet history.   NUTRITION INTERVENTION  Nutrition education (E-1) on the following topics:   Prediabetes Prediabetes:  Prediabetes is a condition where blood sugar levels are higher than normal but not yet high enough to be diagnosed as type 2 diabetes. A1C, or hemoglobin A1c, is a blood test that provides an average of a person's blood sugar levels over the past two to three months. It is commonly used to diagnose and monitor diabetes. For prediabetes, an A1C level between 5.7% and 6.4% typically is used to diagnose this. Here is how the A1C levels are generally categorized: Normal:  A1C below 5.7% Prediabetes:  A1C between 5.7% and 6.4% Diabetes:  A1C of 6.5% or higher When diagnosed with prediabetes, there are several lifestyle changes you can make to manage the condition: Healthy Eating:  Follow a well-balanced diet that includes a variety of fruits, vegetables, whole grains, lean proteins, and healthy fats. Monitor portion sizes and reduce intake of sugary and processed foods. Regular Physical Activity:  Engage in regular physical activity, such as brisk walking, cycling, or other aerobic exercises, for at least 150 minutes per week. Include strength training exercises at least twice a week. Weight Management: Achieve and maintain a healthy weight. Losing even a small amount of weight (3-5%) can significantly improve insulin  sensitivity.  Plate Method Fruits & Vegetables: Aim to fill half your plate with a variety of fruits and vegetables. They are rich in vitamins, minerals, and fiber, and can help reduce the risk of chronic diseases. Choose a colorful assortment of fruits and vegetables to ensure you get a wide range of nutrients. Grains and Starches: Make at least half of your grain choices whole grains, such as brown rice, whole wheat bread, and  oats. Whole grains provide fiber, which aids in digestion and healthy cholesterol levels. Aim for whole forms of starchy vegetables such as potatoes, sweet potatoes, beans, peas, and corn, which are fiber rich and provide many vitamins and minerals.  Protein:  Incorporate lean sources of protein, such as poultry, fish, beans, nuts, and seeds, into your meals. Protein is essential for building and repairing tissues, staying full, balancing blood sugar, as well as supporting immune function. Dairy: Include low-fat or fat-free dairy products like milk, yogurt, and cheese in your diet. Dairy foods are excellent sources of calcium and vitamin D, which are crucial for bone health.   Handouts Provided Include  Plate Method  Learning Style & Readiness for Change Teaching method utilized: Visual & Auditory  Demonstrated degree of understanding via: Teach Back  Barriers to learning/adherence to lifestyle change: none  Assessment of Previous Goal Established by Pt  Goal 1: go walking for 15-30 minutes at least 4 nights per week. - goal met, continue.   Pt wants to work towards incorporating more meatless meals.    MONITORING & EVALUATION Dietary intake, weekly physical activity, and follow up 8 weeks.  Next Steps  Patient is to call for questions.

## 2023-08-05 ENCOUNTER — Other Ambulatory Visit: Payer: Self-pay

## 2023-09-27 ENCOUNTER — Ambulatory Visit: Admitting: Dietician

## 2023-10-12 ENCOUNTER — Other Ambulatory Visit: Payer: Self-pay

## 2023-10-13 ENCOUNTER — Other Ambulatory Visit: Payer: Self-pay

## 2023-11-16 DIAGNOSIS — R051 Acute cough: Secondary | ICD-10-CM | POA: Diagnosis not present

## 2023-11-16 DIAGNOSIS — J0110 Acute frontal sinusitis, unspecified: Secondary | ICD-10-CM | POA: Diagnosis not present

## 2023-12-16 DIAGNOSIS — R030 Elevated blood-pressure reading, without diagnosis of hypertension: Secondary | ICD-10-CM | POA: Diagnosis not present

## 2024-01-17 ENCOUNTER — Ambulatory Visit
Admission: RE | Admit: 2024-01-17 | Discharge: 2024-01-17 | Disposition: A | Source: Ambulatory Visit | Attending: Obstetrics and Gynecology | Admitting: Obstetrics and Gynecology

## 2024-01-17 DIAGNOSIS — R921 Mammographic calcification found on diagnostic imaging of breast: Secondary | ICD-10-CM

## 2024-01-18 ENCOUNTER — Other Ambulatory Visit: Payer: Self-pay | Admitting: Obstetrics and Gynecology

## 2024-01-18 DIAGNOSIS — R928 Other abnormal and inconclusive findings on diagnostic imaging of breast: Secondary | ICD-10-CM

## 2024-07-17 ENCOUNTER — Encounter
